# Patient Record
Sex: Female | Born: 1935
Health system: Southern US, Community
[De-identification: ages and names within clinical notes are randomized; demographics above are authoritative.]

## PROBLEM LIST (undated history)

## (undated) DIAGNOSIS — K219 Gastro-esophageal reflux disease without esophagitis: Secondary | ICD-10-CM

## (undated) DIAGNOSIS — K922 Gastrointestinal hemorrhage, unspecified: Secondary | ICD-10-CM

## (undated) DIAGNOSIS — D46C Myelodysplastic syndrome with isolated del(5q) chromosomal abnormality: Secondary | ICD-10-CM

## (undated) DIAGNOSIS — I82409 Acute embolism and thrombosis of unspecified deep veins of unspecified lower extremity: Secondary | ICD-10-CM

## (undated) DIAGNOSIS — I509 Heart failure, unspecified: Secondary | ICD-10-CM

## (undated) DIAGNOSIS — M810 Age-related osteoporosis without current pathological fracture: Secondary | ICD-10-CM

## (undated) DIAGNOSIS — E039 Hypothyroidism, unspecified: Secondary | ICD-10-CM

## (undated) DIAGNOSIS — E785 Hyperlipidemia, unspecified: Secondary | ICD-10-CM

## (undated) DIAGNOSIS — F419 Anxiety disorder, unspecified: Secondary | ICD-10-CM

## (undated) DIAGNOSIS — Z950 Presence of cardiac pacemaker: Secondary | ICD-10-CM

## (undated) DIAGNOSIS — I2699 Other pulmonary embolism without acute cor pulmonale: Secondary | ICD-10-CM

## (undated) DIAGNOSIS — J8410 Pulmonary fibrosis, unspecified: Secondary | ICD-10-CM

## (undated) DIAGNOSIS — J111 Influenza due to unidentified influenza virus with other respiratory manifestations: Secondary | ICD-10-CM

## (undated) DIAGNOSIS — K5792 Diverticulitis of intestine, part unspecified, without perforation or abscess without bleeding: Secondary | ICD-10-CM

## (undated) DIAGNOSIS — I4891 Unspecified atrial fibrillation: Secondary | ICD-10-CM

## (undated) DIAGNOSIS — K279 Peptic ulcer, site unspecified, unspecified as acute or chronic, without hemorrhage or perforation: Secondary | ICD-10-CM

## (undated) DIAGNOSIS — D332 Benign neoplasm of brain, unspecified: Secondary | ICD-10-CM

## (undated) DIAGNOSIS — H9190 Unspecified hearing loss, unspecified ear: Secondary | ICD-10-CM

## (undated) DIAGNOSIS — M1990 Unspecified osteoarthritis, unspecified site: Secondary | ICD-10-CM

## (undated) DIAGNOSIS — I959 Hypotension, unspecified: Secondary | ICD-10-CM

## (undated) HISTORY — DX: Myelodysplastic syndrome with isolated del(5q) chromosomal abnormality: D46.C

## (undated) HISTORY — DX: Hypothyroidism, unspecified: E03.9

## (undated) HISTORY — DX: Hypotension, unspecified: I95.9

## (undated) HISTORY — PX: HERNIA REPAIR: SHX51

## (undated) HISTORY — DX: Unspecified hearing loss, unspecified ear: H91.90

## (undated) HISTORY — PX: BACK SURGERY: SHX140

## (undated) HISTORY — DX: Influenza due to unidentified influenza virus with other respiratory manifestations: J11.1

## (undated) HISTORY — DX: Benign neoplasm of brain, unspecified: D33.2

## (undated) HISTORY — PX: BILATERAL CARPAL TUNNEL RELEASE: SHX6508

## (undated) HISTORY — PX: APPENDECTOMY: SHX54

## (undated) HISTORY — PX: CHOLECYSTECTOMY: SHX55

## (undated) HISTORY — PX: TONSILLECTOMY: SUR1361

## (undated) HISTORY — DX: Gastro-esophageal reflux disease without esophagitis: K21.9

## (undated) HISTORY — DX: Age-related osteoporosis without current pathological fracture: M81.0

## (undated) HISTORY — DX: Diverticulitis of intestine, part unspecified, without perforation or abscess without bleeding: K57.92

## (undated) HISTORY — DX: Unspecified osteoarthritis, unspecified site: M19.90

## (undated) HISTORY — PX: CATARACT EXTRACTION, BILATERAL: SHX1313

## (undated) HISTORY — DX: Anxiety disorder, unspecified: F41.9

## (undated) HISTORY — PX: PACEMAKER INSERTION: SHX728

## (undated) HISTORY — DX: Peptic ulcer, site unspecified, unspecified as acute or chronic, without hemorrhage or perforation: K27.9

## (undated) HISTORY — PX: TUBAL LIGATION: SHX77

## (undated) HISTORY — PX: ABLATION: SHX5711

## (undated) HISTORY — DX: Hyperlipidemia, unspecified: E78.5

---

## 2010-10-09 DIAGNOSIS — M1990 Unspecified osteoarthritis, unspecified site: Secondary | ICD-10-CM

## 2010-10-09 DIAGNOSIS — M810 Age-related osteoporosis without current pathological fracture: Secondary | ICD-10-CM

## 2010-10-09 HISTORY — DX: Unspecified osteoarthritis, unspecified site: M19.90

## 2010-10-09 HISTORY — DX: Age-related osteoporosis without current pathological fracture: M81.0

## 2014-11-04 DIAGNOSIS — J111 Influenza due to unidentified influenza virus with other respiratory manifestations: Secondary | ICD-10-CM

## 2014-11-04 HISTORY — DX: Influenza due to unidentified influenza virus with other respiratory manifestations: J11.1

## 2015-11-07 DIAGNOSIS — M5406 Panniculitis affecting regions of neck and back, lumbar region: Secondary | ICD-10-CM | POA: Diagnosis not present

## 2015-11-07 DIAGNOSIS — M47816 Spondylosis without myelopathy or radiculopathy, lumbar region: Secondary | ICD-10-CM | POA: Diagnosis not present

## 2015-11-07 DIAGNOSIS — M47817 Spondylosis without myelopathy or radiculopathy, lumbosacral region: Secondary | ICD-10-CM | POA: Diagnosis not present

## 2015-11-07 DIAGNOSIS — M5407 Panniculitis affecting regions of neck and back, lumbosacral region: Secondary | ICD-10-CM | POA: Diagnosis not present

## 2015-11-28 DIAGNOSIS — M5406 Panniculitis affecting regions of neck and back, lumbar region: Secondary | ICD-10-CM | POA: Diagnosis not present

## 2015-11-28 DIAGNOSIS — M47816 Spondylosis without myelopathy or radiculopathy, lumbar region: Secondary | ICD-10-CM | POA: Diagnosis not present

## 2015-11-28 DIAGNOSIS — M47817 Spondylosis without myelopathy or radiculopathy, lumbosacral region: Secondary | ICD-10-CM | POA: Diagnosis not present

## 2015-11-28 DIAGNOSIS — M5407 Panniculitis affecting regions of neck and back, lumbosacral region: Secondary | ICD-10-CM | POA: Diagnosis not present

## 2015-12-19 DIAGNOSIS — M5406 Panniculitis affecting regions of neck and back, lumbar region: Secondary | ICD-10-CM | POA: Diagnosis not present

## 2015-12-19 DIAGNOSIS — M47816 Spondylosis without myelopathy or radiculopathy, lumbar region: Secondary | ICD-10-CM | POA: Diagnosis not present

## 2015-12-19 DIAGNOSIS — M47817 Spondylosis without myelopathy or radiculopathy, lumbosacral region: Secondary | ICD-10-CM | POA: Diagnosis not present

## 2015-12-19 DIAGNOSIS — M5407 Panniculitis affecting regions of neck and back, lumbosacral region: Secondary | ICD-10-CM | POA: Diagnosis not present

## 2015-12-27 DIAGNOSIS — M5407 Panniculitis affecting regions of neck and back, lumbosacral region: Secondary | ICD-10-CM | POA: Diagnosis not present

## 2015-12-27 DIAGNOSIS — M25561 Pain in right knee: Secondary | ICD-10-CM | POA: Diagnosis not present

## 2015-12-27 DIAGNOSIS — M545 Low back pain: Secondary | ICD-10-CM | POA: Diagnosis not present

## 2016-01-10 DIAGNOSIS — Z91128 Patient's intentional underdosing of medication regimen for other reason: Secondary | ICD-10-CM | POA: Diagnosis not present

## 2016-01-10 DIAGNOSIS — I6529 Occlusion and stenosis of unspecified carotid artery: Secondary | ICD-10-CM | POA: Diagnosis not present

## 2016-01-10 DIAGNOSIS — H8149 Vertigo of central origin, unspecified ear: Secondary | ICD-10-CM | POA: Diagnosis not present

## 2016-01-10 DIAGNOSIS — D320 Benign neoplasm of cerebral meninges: Secondary | ICD-10-CM | POA: Diagnosis present

## 2016-01-10 DIAGNOSIS — R51 Headache: Secondary | ICD-10-CM | POA: Diagnosis present

## 2016-01-10 DIAGNOSIS — E2740 Unspecified adrenocortical insufficiency: Secondary | ICD-10-CM | POA: Diagnosis not present

## 2016-01-10 DIAGNOSIS — E785 Hyperlipidemia, unspecified: Secondary | ICD-10-CM | POA: Diagnosis not present

## 2016-01-10 DIAGNOSIS — R109 Unspecified abdominal pain: Secondary | ICD-10-CM | POA: Diagnosis not present

## 2016-01-10 DIAGNOSIS — R05 Cough: Secondary | ICD-10-CM | POA: Diagnosis not present

## 2016-01-10 DIAGNOSIS — R0602 Shortness of breath: Secondary | ICD-10-CM | POA: Diagnosis not present

## 2016-01-10 DIAGNOSIS — I951 Orthostatic hypotension: Secondary | ICD-10-CM | POA: Diagnosis present

## 2016-01-10 DIAGNOSIS — R0609 Other forms of dyspnea: Secondary | ICD-10-CM | POA: Diagnosis not present

## 2016-01-10 DIAGNOSIS — I341 Nonrheumatic mitral (valve) prolapse: Secondary | ICD-10-CM | POA: Diagnosis present

## 2016-01-10 DIAGNOSIS — E039 Hypothyroidism, unspecified: Secondary | ICD-10-CM | POA: Diagnosis present

## 2016-01-10 DIAGNOSIS — R531 Weakness: Secondary | ICD-10-CM | POA: Diagnosis present

## 2016-01-10 DIAGNOSIS — R000 Tachycardia, unspecified: Secondary | ICD-10-CM | POA: Diagnosis not present

## 2016-01-10 DIAGNOSIS — D539 Nutritional anemia, unspecified: Secondary | ICD-10-CM | POA: Diagnosis not present

## 2016-01-10 DIAGNOSIS — R8279 Other abnormal findings on microbiological examination of urine: Secondary | ICD-10-CM | POA: Diagnosis present

## 2016-01-10 DIAGNOSIS — I959 Hypotension, unspecified: Secondary | ICD-10-CM | POA: Diagnosis not present

## 2016-01-10 DIAGNOSIS — T380X6A Underdosing of glucocorticoids and synthetic analogues, initial encounter: Secondary | ICD-10-CM | POA: Diagnosis present

## 2016-01-10 DIAGNOSIS — Z23 Encounter for immunization: Secondary | ICD-10-CM | POA: Diagnosis not present

## 2016-01-11 DIAGNOSIS — I6529 Occlusion and stenosis of unspecified carotid artery: Secondary | ICD-10-CM | POA: Diagnosis not present

## 2016-01-11 DIAGNOSIS — D539 Nutritional anemia, unspecified: Secondary | ICD-10-CM | POA: Diagnosis not present

## 2016-01-11 DIAGNOSIS — E2740 Unspecified adrenocortical insufficiency: Secondary | ICD-10-CM | POA: Diagnosis not present

## 2016-01-11 DIAGNOSIS — R51 Headache: Secondary | ICD-10-CM | POA: Diagnosis not present

## 2016-01-11 DIAGNOSIS — I951 Orthostatic hypotension: Secondary | ICD-10-CM | POA: Diagnosis not present

## 2016-01-11 DIAGNOSIS — H8149 Vertigo of central origin, unspecified ear: Secondary | ICD-10-CM | POA: Diagnosis not present

## 2016-01-11 DIAGNOSIS — R531 Weakness: Secondary | ICD-10-CM | POA: Diagnosis not present

## 2016-01-11 DIAGNOSIS — I959 Hypotension, unspecified: Secondary | ICD-10-CM | POA: Diagnosis not present

## 2016-01-11 DIAGNOSIS — D320 Benign neoplasm of cerebral meninges: Secondary | ICD-10-CM | POA: Diagnosis not present

## 2016-01-12 DIAGNOSIS — I350 Nonrheumatic aortic (valve) stenosis: Secondary | ICD-10-CM | POA: Diagnosis not present

## 2016-01-12 DIAGNOSIS — I272 Other secondary pulmonary hypertension: Secondary | ICD-10-CM | POA: Diagnosis not present

## 2016-01-16 DIAGNOSIS — I10 Essential (primary) hypertension: Secondary | ICD-10-CM | POA: Diagnosis not present

## 2016-01-16 DIAGNOSIS — E784 Other hyperlipidemia: Secondary | ICD-10-CM | POA: Diagnosis not present

## 2016-01-16 DIAGNOSIS — Z8249 Family history of ischemic heart disease and other diseases of the circulatory system: Secondary | ICD-10-CM | POA: Diagnosis not present

## 2016-01-16 DIAGNOSIS — K219 Gastro-esophageal reflux disease without esophagitis: Secondary | ICD-10-CM | POA: Diagnosis not present

## 2016-01-16 DIAGNOSIS — R072 Precordial pain: Secondary | ICD-10-CM | POA: Diagnosis not present

## 2016-01-16 DIAGNOSIS — G4700 Insomnia, unspecified: Secondary | ICD-10-CM | POA: Diagnosis not present

## 2016-01-16 DIAGNOSIS — E039 Hypothyroidism, unspecified: Secondary | ICD-10-CM | POA: Diagnosis not present

## 2016-01-16 DIAGNOSIS — R9431 Abnormal electrocardiogram [ECG] [EKG]: Secondary | ICD-10-CM | POA: Diagnosis not present

## 2016-01-17 DIAGNOSIS — D518 Other vitamin B12 deficiency anemias: Secondary | ICD-10-CM | POA: Diagnosis not present

## 2016-01-17 DIAGNOSIS — R0902 Hypoxemia: Secondary | ICD-10-CM | POA: Diagnosis not present

## 2016-01-17 DIAGNOSIS — R0602 Shortness of breath: Secondary | ICD-10-CM | POA: Diagnosis not present

## 2016-01-17 DIAGNOSIS — K219 Gastro-esophageal reflux disease without esophagitis: Secondary | ICD-10-CM | POA: Diagnosis not present

## 2016-01-17 DIAGNOSIS — R918 Other nonspecific abnormal finding of lung field: Secondary | ICD-10-CM | POA: Diagnosis not present

## 2016-01-17 DIAGNOSIS — I951 Orthostatic hypotension: Secondary | ICD-10-CM | POA: Diagnosis not present

## 2016-01-17 DIAGNOSIS — R0989 Other specified symptoms and signs involving the circulatory and respiratory systems: Secondary | ICD-10-CM | POA: Diagnosis not present

## 2016-01-17 DIAGNOSIS — E2740 Unspecified adrenocortical insufficiency: Secondary | ICD-10-CM | POA: Diagnosis not present

## 2016-01-22 DIAGNOSIS — K219 Gastro-esophageal reflux disease without esophagitis: Secondary | ICD-10-CM | POA: Diagnosis not present

## 2016-01-22 DIAGNOSIS — R918 Other nonspecific abnormal finding of lung field: Secondary | ICD-10-CM | POA: Diagnosis not present

## 2016-01-22 DIAGNOSIS — R05 Cough: Secondary | ICD-10-CM | POA: Diagnosis not present

## 2016-01-22 DIAGNOSIS — R0602 Shortness of breath: Secondary | ICD-10-CM | POA: Diagnosis not present

## 2016-01-22 DIAGNOSIS — J9611 Chronic respiratory failure with hypoxia: Secondary | ICD-10-CM | POA: Diagnosis not present

## 2016-01-23 DIAGNOSIS — K219 Gastro-esophageal reflux disease without esophagitis: Secondary | ICD-10-CM | POA: Diagnosis not present

## 2016-01-23 DIAGNOSIS — R1314 Dysphagia, pharyngoesophageal phase: Secondary | ICD-10-CM | POA: Diagnosis not present

## 2016-01-23 DIAGNOSIS — R0902 Hypoxemia: Secondary | ICD-10-CM | POA: Diagnosis not present

## 2016-01-24 DIAGNOSIS — J45909 Unspecified asthma, uncomplicated: Secondary | ICD-10-CM | POA: Diagnosis not present

## 2016-01-24 DIAGNOSIS — R0902 Hypoxemia: Secondary | ICD-10-CM | POA: Diagnosis not present

## 2016-01-24 DIAGNOSIS — K219 Gastro-esophageal reflux disease without esophagitis: Secondary | ICD-10-CM | POA: Diagnosis not present

## 2016-01-24 DIAGNOSIS — I10 Essential (primary) hypertension: Secondary | ICD-10-CM | POA: Diagnosis not present

## 2016-01-24 DIAGNOSIS — E2740 Unspecified adrenocortical insufficiency: Secondary | ICD-10-CM | POA: Diagnosis not present

## 2016-01-24 DIAGNOSIS — I059 Rheumatic mitral valve disease, unspecified: Secondary | ICD-10-CM | POA: Diagnosis not present

## 2016-01-24 DIAGNOSIS — I25118 Atherosclerotic heart disease of native coronary artery with other forms of angina pectoris: Secondary | ICD-10-CM | POA: Diagnosis not present

## 2016-01-26 DIAGNOSIS — M79602 Pain in left arm: Secondary | ICD-10-CM | POA: Diagnosis not present

## 2016-01-26 DIAGNOSIS — I5189 Other ill-defined heart diseases: Secondary | ICD-10-CM | POA: Diagnosis not present

## 2016-01-26 DIAGNOSIS — I959 Hypotension, unspecified: Secondary | ICD-10-CM | POA: Diagnosis not present

## 2016-01-26 DIAGNOSIS — R079 Chest pain, unspecified: Secondary | ICD-10-CM | POA: Diagnosis not present

## 2016-02-08 DIAGNOSIS — B3781 Candidal esophagitis: Secondary | ICD-10-CM | POA: Diagnosis not present

## 2016-02-08 DIAGNOSIS — J45909 Unspecified asthma, uncomplicated: Secondary | ICD-10-CM | POA: Diagnosis not present

## 2016-02-08 DIAGNOSIS — K449 Diaphragmatic hernia without obstruction or gangrene: Secondary | ICD-10-CM | POA: Diagnosis not present

## 2016-02-08 DIAGNOSIS — K219 Gastro-esophageal reflux disease without esophagitis: Secondary | ICD-10-CM | POA: Diagnosis not present

## 2016-02-08 DIAGNOSIS — R0902 Hypoxemia: Secondary | ICD-10-CM | POA: Diagnosis not present

## 2016-02-08 DIAGNOSIS — R1310 Dysphagia, unspecified: Secondary | ICD-10-CM | POA: Diagnosis not present

## 2016-02-08 DIAGNOSIS — K2950 Unspecified chronic gastritis without bleeding: Secondary | ICD-10-CM | POA: Diagnosis not present

## 2016-02-08 DIAGNOSIS — K222 Esophageal obstruction: Secondary | ICD-10-CM | POA: Diagnosis not present

## 2016-02-09 DIAGNOSIS — M810 Age-related osteoporosis without current pathological fracture: Secondary | ICD-10-CM | POA: Diagnosis not present

## 2016-02-21 DIAGNOSIS — J45909 Unspecified asthma, uncomplicated: Secondary | ICD-10-CM | POA: Diagnosis not present

## 2016-02-21 DIAGNOSIS — R7309 Other abnormal glucose: Secondary | ICD-10-CM | POA: Diagnosis not present

## 2016-02-21 DIAGNOSIS — R7989 Other specified abnormal findings of blood chemistry: Secondary | ICD-10-CM | POA: Diagnosis not present

## 2016-02-21 DIAGNOSIS — R0902 Hypoxemia: Secondary | ICD-10-CM | POA: Diagnosis not present

## 2016-02-21 DIAGNOSIS — I059 Rheumatic mitral valve disease, unspecified: Secondary | ICD-10-CM | POA: Diagnosis not present

## 2016-02-21 DIAGNOSIS — I25118 Atherosclerotic heart disease of native coronary artery with other forms of angina pectoris: Secondary | ICD-10-CM | POA: Diagnosis not present

## 2016-02-21 DIAGNOSIS — K219 Gastro-esophageal reflux disease without esophagitis: Secondary | ICD-10-CM | POA: Diagnosis not present

## 2016-02-21 DIAGNOSIS — E2740 Unspecified adrenocortical insufficiency: Secondary | ICD-10-CM | POA: Diagnosis not present

## 2016-02-21 DIAGNOSIS — I10 Essential (primary) hypertension: Secondary | ICD-10-CM | POA: Diagnosis not present

## 2016-02-21 DIAGNOSIS — E538 Deficiency of other specified B group vitamins: Secondary | ICD-10-CM | POA: Diagnosis not present

## 2016-02-21 DIAGNOSIS — R252 Cramp and spasm: Secondary | ICD-10-CM | POA: Diagnosis not present

## 2016-02-23 LAB — BASIC METABOLIC PANEL
BUN: 39 — AB (ref 4–21)
Creatinine: 0.9 (ref 0.5–1.1)
Glucose: 105
Potassium: 5.2 (ref 3.4–5.3)
Sodium: 139 (ref 137–147)

## 2016-02-23 LAB — HEPATIC FUNCTION PANEL
ALT: 51 — AB (ref 7–35)
AST: 17 (ref 13–35)
Alkaline Phosphatase: 45 (ref 25–125)
Bilirubin, Total: 0.4

## 2016-03-12 DIAGNOSIS — J9611 Chronic respiratory failure with hypoxia: Secondary | ICD-10-CM | POA: Diagnosis not present

## 2016-03-12 DIAGNOSIS — R918 Other nonspecific abnormal finding of lung field: Secondary | ICD-10-CM | POA: Diagnosis not present

## 2016-03-12 DIAGNOSIS — R0602 Shortness of breath: Secondary | ICD-10-CM | POA: Diagnosis not present

## 2016-03-19 DIAGNOSIS — J8410 Pulmonary fibrosis, unspecified: Secondary | ICD-10-CM | POA: Diagnosis not present

## 2016-03-19 DIAGNOSIS — K760 Fatty (change of) liver, not elsewhere classified: Secondary | ICD-10-CM | POA: Diagnosis not present

## 2016-03-19 DIAGNOSIS — I2510 Atherosclerotic heart disease of native coronary artery without angina pectoris: Secondary | ICD-10-CM | POA: Diagnosis not present

## 2016-03-19 DIAGNOSIS — R0600 Dyspnea, unspecified: Secondary | ICD-10-CM | POA: Diagnosis not present

## 2016-03-19 DIAGNOSIS — S22009A Unspecified fracture of unspecified thoracic vertebra, initial encounter for closed fracture: Secondary | ICD-10-CM | POA: Diagnosis not present

## 2016-03-21 DIAGNOSIS — J9611 Chronic respiratory failure with hypoxia: Secondary | ICD-10-CM | POA: Diagnosis not present

## 2016-03-21 LAB — PULMONARY FUNCTION TEST
FEV1/FVC: 91 %
FEV1: 1.57 L
FVC: 1.72 L

## 2016-03-24 DIAGNOSIS — T8089XA Other complications following infusion, transfusion and therapeutic injection, initial encounter: Secondary | ICD-10-CM | POA: Diagnosis not present

## 2016-03-24 DIAGNOSIS — I480 Paroxysmal atrial fibrillation: Secondary | ICD-10-CM | POA: Diagnosis not present

## 2016-03-24 DIAGNOSIS — Z86011 Personal history of benign neoplasm of the brain: Secondary | ICD-10-CM | POA: Diagnosis not present

## 2016-03-24 DIAGNOSIS — R0602 Shortness of breath: Secondary | ICD-10-CM | POA: Diagnosis not present

## 2016-03-24 DIAGNOSIS — R471 Dysarthria and anarthria: Secondary | ICD-10-CM | POA: Diagnosis not present

## 2016-03-24 DIAGNOSIS — E785 Hyperlipidemia, unspecified: Secondary | ICD-10-CM | POA: Diagnosis present

## 2016-03-24 DIAGNOSIS — D649 Anemia, unspecified: Secondary | ICD-10-CM | POA: Diagnosis present

## 2016-03-24 DIAGNOSIS — I82432 Acute embolism and thrombosis of left popliteal vein: Secondary | ICD-10-CM | POA: Diagnosis present

## 2016-03-24 DIAGNOSIS — I82412 Acute embolism and thrombosis of left femoral vein: Secondary | ICD-10-CM | POA: Diagnosis present

## 2016-03-24 DIAGNOSIS — E039 Hypothyroidism, unspecified: Secondary | ICD-10-CM | POA: Diagnosis present

## 2016-03-24 DIAGNOSIS — K279 Peptic ulcer, site unspecified, unspecified as acute or chronic, without hemorrhage or perforation: Secondary | ICD-10-CM | POA: Diagnosis present

## 2016-03-24 DIAGNOSIS — I10 Essential (primary) hypertension: Secondary | ICD-10-CM | POA: Diagnosis present

## 2016-03-24 DIAGNOSIS — K922 Gastrointestinal hemorrhage, unspecified: Secondary | ICD-10-CM | POA: Diagnosis present

## 2016-03-24 DIAGNOSIS — H9190 Unspecified hearing loss, unspecified ear: Secondary | ICD-10-CM | POA: Diagnosis present

## 2016-03-24 DIAGNOSIS — I4891 Unspecified atrial fibrillation: Secondary | ICD-10-CM | POA: Diagnosis not present

## 2016-03-24 DIAGNOSIS — I639 Cerebral infarction, unspecified: Secondary | ICD-10-CM | POA: Diagnosis not present

## 2016-03-24 DIAGNOSIS — E538 Deficiency of other specified B group vitamins: Secondary | ICD-10-CM | POA: Diagnosis present

## 2016-03-24 DIAGNOSIS — K219 Gastro-esophageal reflux disease without esophagitis: Secondary | ICD-10-CM | POA: Diagnosis present

## 2016-03-24 DIAGNOSIS — E7800 Pure hypercholesterolemia, unspecified: Secondary | ICD-10-CM | POA: Diagnosis present

## 2016-03-24 DIAGNOSIS — J84112 Idiopathic pulmonary fibrosis: Secondary | ICD-10-CM | POA: Diagnosis present

## 2016-03-24 DIAGNOSIS — E119 Type 2 diabetes mellitus without complications: Secondary | ICD-10-CM | POA: Diagnosis not present

## 2016-03-24 DIAGNOSIS — R42 Dizziness and giddiness: Secondary | ICD-10-CM | POA: Diagnosis present

## 2016-03-24 DIAGNOSIS — D709 Neutropenia, unspecified: Secondary | ICD-10-CM | POA: Diagnosis not present

## 2016-03-24 DIAGNOSIS — D539 Nutritional anemia, unspecified: Secondary | ICD-10-CM | POA: Diagnosis not present

## 2016-03-24 DIAGNOSIS — I2699 Other pulmonary embolism without acute cor pulmonale: Secondary | ICD-10-CM | POA: Diagnosis not present

## 2016-03-24 DIAGNOSIS — E876 Hypokalemia: Secondary | ICD-10-CM | POA: Diagnosis not present

## 2016-03-24 DIAGNOSIS — Z7982 Long term (current) use of aspirin: Secondary | ICD-10-CM | POA: Diagnosis not present

## 2016-03-24 DIAGNOSIS — M1990 Unspecified osteoarthritis, unspecified site: Secondary | ICD-10-CM | POA: Diagnosis present

## 2016-03-24 DIAGNOSIS — I2510 Atherosclerotic heart disease of native coronary artery without angina pectoris: Secondary | ICD-10-CM | POA: Diagnosis present

## 2016-03-24 DIAGNOSIS — D696 Thrombocytopenia, unspecified: Secondary | ICD-10-CM | POA: Diagnosis not present

## 2016-04-04 DIAGNOSIS — I82402 Acute embolism and thrombosis of unspecified deep veins of left lower extremity: Secondary | ICD-10-CM | POA: Diagnosis not present

## 2016-04-04 DIAGNOSIS — I110 Hypertensive heart disease with heart failure: Secondary | ICD-10-CM | POA: Diagnosis not present

## 2016-04-04 DIAGNOSIS — R001 Bradycardia, unspecified: Secondary | ICD-10-CM | POA: Diagnosis not present

## 2016-04-04 DIAGNOSIS — T44991A Poisoning by other drug primarily affecting the autonomic nervous system, accidental (unintentional), initial encounter: Secondary | ICD-10-CM | POA: Diagnosis not present

## 2016-04-04 DIAGNOSIS — I495 Sick sinus syndrome: Secondary | ICD-10-CM | POA: Diagnosis not present

## 2016-04-04 DIAGNOSIS — R42 Dizziness and giddiness: Secondary | ICD-10-CM | POA: Diagnosis not present

## 2016-04-04 DIAGNOSIS — I481 Persistent atrial fibrillation: Secondary | ICD-10-CM | POA: Diagnosis not present

## 2016-04-04 DIAGNOSIS — I951 Orthostatic hypotension: Secondary | ICD-10-CM | POA: Diagnosis not present

## 2016-04-04 DIAGNOSIS — T447X5A Adverse effect of beta-adrenoreceptor antagonists, initial encounter: Secondary | ICD-10-CM | POA: Diagnosis not present

## 2016-04-04 DIAGNOSIS — I5033 Acute on chronic diastolic (congestive) heart failure: Secondary | ICD-10-CM | POA: Diagnosis not present

## 2016-04-04 DIAGNOSIS — J9611 Chronic respiratory failure with hypoxia: Secondary | ICD-10-CM | POA: Diagnosis not present

## 2016-04-05 DIAGNOSIS — I4891 Unspecified atrial fibrillation: Secondary | ICD-10-CM | POA: Diagnosis not present

## 2016-04-05 DIAGNOSIS — E039 Hypothyroidism, unspecified: Secondary | ICD-10-CM | POA: Diagnosis present

## 2016-04-05 DIAGNOSIS — I341 Nonrheumatic mitral (valve) prolapse: Secondary | ICD-10-CM | POA: Diagnosis present

## 2016-04-05 DIAGNOSIS — I509 Heart failure, unspecified: Secondary | ICD-10-CM | POA: Diagnosis not present

## 2016-04-05 DIAGNOSIS — I10 Essential (primary) hypertension: Secondary | ICD-10-CM | POA: Diagnosis not present

## 2016-04-05 DIAGNOSIS — I82402 Acute embolism and thrombosis of unspecified deep veins of left lower extremity: Secondary | ICD-10-CM | POA: Diagnosis present

## 2016-04-05 DIAGNOSIS — J84112 Idiopathic pulmonary fibrosis: Secondary | ICD-10-CM | POA: Diagnosis present

## 2016-04-05 DIAGNOSIS — T44991A Poisoning by other drug primarily affecting the autonomic nervous system, accidental (unintentional), initial encounter: Secondary | ICD-10-CM | POA: Diagnosis not present

## 2016-04-05 DIAGNOSIS — I2510 Atherosclerotic heart disease of native coronary artery without angina pectoris: Secondary | ICD-10-CM | POA: Diagnosis present

## 2016-04-05 DIAGNOSIS — I480 Paroxysmal atrial fibrillation: Secondary | ICD-10-CM | POA: Diagnosis not present

## 2016-04-05 DIAGNOSIS — I959 Hypotension, unspecified: Secondary | ICD-10-CM | POA: Diagnosis not present

## 2016-04-05 DIAGNOSIS — I952 Hypotension due to drugs: Secondary | ICD-10-CM | POA: Diagnosis present

## 2016-04-05 DIAGNOSIS — R42 Dizziness and giddiness: Secondary | ICD-10-CM | POA: Diagnosis not present

## 2016-04-05 DIAGNOSIS — R001 Bradycardia, unspecified: Secondary | ICD-10-CM | POA: Diagnosis not present

## 2016-04-05 DIAGNOSIS — D649 Anemia, unspecified: Secondary | ICD-10-CM | POA: Diagnosis present

## 2016-04-05 DIAGNOSIS — K219 Gastro-esophageal reflux disease without esophagitis: Secondary | ICD-10-CM | POA: Diagnosis present

## 2016-04-05 DIAGNOSIS — I495 Sick sinus syndrome: Secondary | ICD-10-CM | POA: Diagnosis present

## 2016-04-05 DIAGNOSIS — J9611 Chronic respiratory failure with hypoxia: Secondary | ICD-10-CM | POA: Diagnosis present

## 2016-04-05 DIAGNOSIS — R031 Nonspecific low blood-pressure reading: Secondary | ICD-10-CM | POA: Diagnosis not present

## 2016-04-05 DIAGNOSIS — Z7901 Long term (current) use of anticoagulants: Secondary | ICD-10-CM | POA: Diagnosis not present

## 2016-04-05 DIAGNOSIS — Z86011 Personal history of benign neoplasm of the brain: Secondary | ICD-10-CM | POA: Diagnosis not present

## 2016-04-05 DIAGNOSIS — I951 Orthostatic hypotension: Secondary | ICD-10-CM | POA: Diagnosis not present

## 2016-04-05 DIAGNOSIS — T447X5A Adverse effect of beta-adrenoreceptor antagonists, initial encounter: Secondary | ICD-10-CM | POA: Diagnosis not present

## 2016-04-05 DIAGNOSIS — I110 Hypertensive heart disease with heart failure: Secondary | ICD-10-CM | POA: Diagnosis present

## 2016-04-05 DIAGNOSIS — I5033 Acute on chronic diastolic (congestive) heart failure: Secondary | ICD-10-CM | POA: Diagnosis present

## 2016-04-05 DIAGNOSIS — I481 Persistent atrial fibrillation: Secondary | ICD-10-CM | POA: Diagnosis present

## 2016-04-05 DIAGNOSIS — D6489 Other specified anemias: Secondary | ICD-10-CM | POA: Diagnosis present

## 2016-04-05 DIAGNOSIS — C148 Malignant neoplasm of overlapping sites of lip, oral cavity and pharynx: Secondary | ICD-10-CM | POA: Diagnosis not present

## 2016-04-06 DIAGNOSIS — E039 Hypothyroidism, unspecified: Secondary | ICD-10-CM | POA: Diagnosis present

## 2016-04-06 DIAGNOSIS — Z9981 Dependence on supplemental oxygen: Secondary | ICD-10-CM | POA: Diagnosis not present

## 2016-04-06 DIAGNOSIS — J45909 Unspecified asthma, uncomplicated: Secondary | ICD-10-CM | POA: Diagnosis present

## 2016-04-06 DIAGNOSIS — J849 Interstitial pulmonary disease, unspecified: Secondary | ICD-10-CM | POA: Diagnosis present

## 2016-04-06 DIAGNOSIS — Z7901 Long term (current) use of anticoagulants: Secondary | ICD-10-CM | POA: Diagnosis not present

## 2016-04-06 DIAGNOSIS — R001 Bradycardia, unspecified: Secondary | ICD-10-CM | POA: Diagnosis not present

## 2016-04-06 DIAGNOSIS — I4891 Unspecified atrial fibrillation: Secondary | ICD-10-CM | POA: Diagnosis not present

## 2016-04-06 DIAGNOSIS — J90 Pleural effusion, not elsewhere classified: Secondary | ICD-10-CM | POA: Diagnosis present

## 2016-04-06 DIAGNOSIS — E785 Hyperlipidemia, unspecified: Secondary | ICD-10-CM | POA: Diagnosis present

## 2016-04-06 DIAGNOSIS — Z8673 Personal history of transient ischemic attack (TIA), and cerebral infarction without residual deficits: Secondary | ICD-10-CM | POA: Diagnosis not present

## 2016-04-06 DIAGNOSIS — D539 Nutritional anemia, unspecified: Secondary | ICD-10-CM | POA: Diagnosis present

## 2016-04-06 DIAGNOSIS — Z8249 Family history of ischemic heart disease and other diseases of the circulatory system: Secondary | ICD-10-CM | POA: Diagnosis not present

## 2016-04-06 DIAGNOSIS — I341 Nonrheumatic mitral (valve) prolapse: Secondary | ICD-10-CM | POA: Diagnosis present

## 2016-04-06 DIAGNOSIS — I340 Nonrheumatic mitral (valve) insufficiency: Secondary | ICD-10-CM | POA: Diagnosis not present

## 2016-04-06 DIAGNOSIS — K219 Gastro-esophageal reflux disease without esophagitis: Secondary | ICD-10-CM | POA: Diagnosis present

## 2016-04-06 DIAGNOSIS — R0600 Dyspnea, unspecified: Secondary | ICD-10-CM | POA: Diagnosis not present

## 2016-04-06 DIAGNOSIS — I82402 Acute embolism and thrombosis of unspecified deep veins of left lower extremity: Secondary | ICD-10-CM | POA: Diagnosis present

## 2016-04-06 DIAGNOSIS — M1990 Unspecified osteoarthritis, unspecified site: Secondary | ICD-10-CM | POA: Diagnosis present

## 2016-04-06 DIAGNOSIS — I480 Paroxysmal atrial fibrillation: Secondary | ICD-10-CM | POA: Diagnosis present

## 2016-04-06 DIAGNOSIS — J810 Acute pulmonary edema: Secondary | ICD-10-CM | POA: Diagnosis not present

## 2016-04-16 DIAGNOSIS — I481 Persistent atrial fibrillation: Secondary | ICD-10-CM | POA: Diagnosis not present

## 2016-04-16 DIAGNOSIS — I482 Chronic atrial fibrillation: Secondary | ICD-10-CM | POA: Diagnosis not present

## 2016-04-16 DIAGNOSIS — Z888 Allergy status to other drugs, medicaments and biological substances status: Secondary | ICD-10-CM | POA: Diagnosis not present

## 2016-04-16 DIAGNOSIS — J45909 Unspecified asthma, uncomplicated: Secondary | ICD-10-CM | POA: Diagnosis present

## 2016-04-16 DIAGNOSIS — I82402 Acute embolism and thrombosis of unspecified deep veins of left lower extremity: Secondary | ICD-10-CM | POA: Diagnosis not present

## 2016-04-16 DIAGNOSIS — Z4682 Encounter for fitting and adjustment of non-vascular catheter: Secondary | ICD-10-CM | POA: Diagnosis not present

## 2016-04-16 DIAGNOSIS — Z79899 Other long term (current) drug therapy: Secondary | ICD-10-CM | POA: Diagnosis not present

## 2016-04-16 DIAGNOSIS — I5032 Chronic diastolic (congestive) heart failure: Secondary | ICD-10-CM | POA: Diagnosis present

## 2016-04-16 DIAGNOSIS — K279 Peptic ulcer, site unspecified, unspecified as acute or chronic, without hemorrhage or perforation: Secondary | ICD-10-CM | POA: Diagnosis present

## 2016-04-16 DIAGNOSIS — I808 Phlebitis and thrombophlebitis of other sites: Secondary | ICD-10-CM | POA: Diagnosis not present

## 2016-04-16 DIAGNOSIS — I82409 Acute embolism and thrombosis of unspecified deep veins of unspecified lower extremity: Secondary | ICD-10-CM | POA: Diagnosis not present

## 2016-04-16 DIAGNOSIS — R0602 Shortness of breath: Secondary | ICD-10-CM | POA: Diagnosis not present

## 2016-04-16 DIAGNOSIS — K259 Gastric ulcer, unspecified as acute or chronic, without hemorrhage or perforation: Secondary | ICD-10-CM | POA: Diagnosis not present

## 2016-04-16 DIAGNOSIS — Z7901 Long term (current) use of anticoagulants: Secondary | ICD-10-CM | POA: Diagnosis not present

## 2016-04-16 DIAGNOSIS — Z9981 Dependence on supplemental oxygen: Secondary | ICD-10-CM | POA: Diagnosis not present

## 2016-04-16 DIAGNOSIS — R1310 Dysphagia, unspecified: Secondary | ICD-10-CM | POA: Diagnosis present

## 2016-04-16 DIAGNOSIS — R109 Unspecified abdominal pain: Secondary | ICD-10-CM | POA: Diagnosis not present

## 2016-04-16 DIAGNOSIS — E039 Hypothyroidism, unspecified: Secondary | ICD-10-CM | POA: Diagnosis present

## 2016-04-16 DIAGNOSIS — K228 Other specified diseases of esophagus: Secondary | ICD-10-CM | POA: Diagnosis present

## 2016-04-16 DIAGNOSIS — I4891 Unspecified atrial fibrillation: Secondary | ICD-10-CM | POA: Diagnosis present

## 2016-04-16 DIAGNOSIS — J810 Acute pulmonary edema: Secondary | ICD-10-CM | POA: Diagnosis not present

## 2016-04-16 DIAGNOSIS — I341 Nonrheumatic mitral (valve) prolapse: Secondary | ICD-10-CM | POA: Diagnosis present

## 2016-04-16 DIAGNOSIS — R918 Other nonspecific abnormal finding of lung field: Secondary | ICD-10-CM | POA: Diagnosis not present

## 2016-04-16 DIAGNOSIS — H9190 Unspecified hearing loss, unspecified ear: Secondary | ICD-10-CM | POA: Diagnosis present

## 2016-04-16 DIAGNOSIS — D539 Nutritional anemia, unspecified: Secondary | ICD-10-CM | POA: Diagnosis present

## 2016-04-16 DIAGNOSIS — I509 Heart failure, unspecified: Secondary | ICD-10-CM | POA: Diagnosis not present

## 2016-04-16 DIAGNOSIS — K219 Gastro-esophageal reflux disease without esophagitis: Secondary | ICD-10-CM | POA: Diagnosis present

## 2016-04-16 DIAGNOSIS — Z7982 Long term (current) use of aspirin: Secondary | ICD-10-CM | POA: Diagnosis not present

## 2016-04-16 DIAGNOSIS — R079 Chest pain, unspecified: Secondary | ICD-10-CM | POA: Diagnosis not present

## 2016-04-16 DIAGNOSIS — Z79890 Hormone replacement therapy (postmenopausal): Secondary | ICD-10-CM | POA: Diagnosis not present

## 2016-04-16 DIAGNOSIS — J849 Interstitial pulmonary disease, unspecified: Secondary | ICD-10-CM | POA: Diagnosis present

## 2016-04-16 DIAGNOSIS — E785 Hyperlipidemia, unspecified: Secondary | ICD-10-CM | POA: Diagnosis present

## 2016-04-16 DIAGNOSIS — J90 Pleural effusion, not elsewhere classified: Secondary | ICD-10-CM | POA: Diagnosis not present

## 2016-04-23 DIAGNOSIS — J849 Interstitial pulmonary disease, unspecified: Secondary | ICD-10-CM | POA: Diagnosis not present

## 2016-04-23 DIAGNOSIS — J45909 Unspecified asthma, uncomplicated: Secondary | ICD-10-CM | POA: Diagnosis not present

## 2016-04-23 DIAGNOSIS — M1991 Primary osteoarthritis, unspecified site: Secondary | ICD-10-CM | POA: Diagnosis not present

## 2016-04-23 DIAGNOSIS — Z48812 Encounter for surgical aftercare following surgery on the circulatory system: Secondary | ICD-10-CM | POA: Diagnosis not present

## 2016-04-23 DIAGNOSIS — I4891 Unspecified atrial fibrillation: Secondary | ICD-10-CM | POA: Diagnosis not present

## 2016-04-23 DIAGNOSIS — D539 Nutritional anemia, unspecified: Secondary | ICD-10-CM | POA: Diagnosis not present

## 2016-04-24 DIAGNOSIS — I480 Paroxysmal atrial fibrillation: Secondary | ICD-10-CM | POA: Diagnosis not present

## 2016-04-24 DIAGNOSIS — M1991 Primary osteoarthritis, unspecified site: Secondary | ICD-10-CM | POA: Diagnosis not present

## 2016-04-24 DIAGNOSIS — I4891 Unspecified atrial fibrillation: Secondary | ICD-10-CM | POA: Diagnosis not present

## 2016-04-24 DIAGNOSIS — J45909 Unspecified asthma, uncomplicated: Secondary | ICD-10-CM | POA: Diagnosis not present

## 2016-04-24 DIAGNOSIS — Z7901 Long term (current) use of anticoagulants: Secondary | ICD-10-CM | POA: Diagnosis not present

## 2016-04-24 DIAGNOSIS — J849 Interstitial pulmonary disease, unspecified: Secondary | ICD-10-CM | POA: Diagnosis not present

## 2016-04-24 DIAGNOSIS — D539 Nutritional anemia, unspecified: Secondary | ICD-10-CM | POA: Diagnosis not present

## 2016-04-24 DIAGNOSIS — Z48812 Encounter for surgical aftercare following surgery on the circulatory system: Secondary | ICD-10-CM | POA: Diagnosis not present

## 2016-04-24 DIAGNOSIS — R630 Anorexia: Secondary | ICD-10-CM | POA: Diagnosis not present

## 2016-04-25 DIAGNOSIS — D649 Anemia, unspecified: Secondary | ICD-10-CM | POA: Diagnosis not present

## 2016-04-29 DIAGNOSIS — D539 Nutritional anemia, unspecified: Secondary | ICD-10-CM | POA: Diagnosis not present

## 2016-04-29 DIAGNOSIS — J45909 Unspecified asthma, uncomplicated: Secondary | ICD-10-CM | POA: Diagnosis not present

## 2016-04-29 DIAGNOSIS — Z48812 Encounter for surgical aftercare following surgery on the circulatory system: Secondary | ICD-10-CM | POA: Diagnosis not present

## 2016-04-29 DIAGNOSIS — I4891 Unspecified atrial fibrillation: Secondary | ICD-10-CM | POA: Diagnosis not present

## 2016-04-29 DIAGNOSIS — J849 Interstitial pulmonary disease, unspecified: Secondary | ICD-10-CM | POA: Diagnosis not present

## 2016-04-29 DIAGNOSIS — M1991 Primary osteoarthritis, unspecified site: Secondary | ICD-10-CM | POA: Diagnosis not present

## 2016-05-01 DIAGNOSIS — M1991 Primary osteoarthritis, unspecified site: Secondary | ICD-10-CM | POA: Diagnosis not present

## 2016-05-01 DIAGNOSIS — D539 Nutritional anemia, unspecified: Secondary | ICD-10-CM | POA: Diagnosis not present

## 2016-05-01 DIAGNOSIS — J45909 Unspecified asthma, uncomplicated: Secondary | ICD-10-CM | POA: Diagnosis not present

## 2016-05-01 DIAGNOSIS — Z48812 Encounter for surgical aftercare following surgery on the circulatory system: Secondary | ICD-10-CM | POA: Diagnosis not present

## 2016-05-01 DIAGNOSIS — I4891 Unspecified atrial fibrillation: Secondary | ICD-10-CM | POA: Diagnosis not present

## 2016-05-01 DIAGNOSIS — J849 Interstitial pulmonary disease, unspecified: Secondary | ICD-10-CM | POA: Diagnosis not present

## 2016-05-02 DIAGNOSIS — I4891 Unspecified atrial fibrillation: Secondary | ICD-10-CM | POA: Diagnosis not present

## 2016-05-02 DIAGNOSIS — Z950 Presence of cardiac pacemaker: Secondary | ICD-10-CM | POA: Diagnosis not present

## 2016-05-06 DIAGNOSIS — M1991 Primary osteoarthritis, unspecified site: Secondary | ICD-10-CM | POA: Diagnosis not present

## 2016-05-06 DIAGNOSIS — I4891 Unspecified atrial fibrillation: Secondary | ICD-10-CM | POA: Diagnosis not present

## 2016-05-06 DIAGNOSIS — J849 Interstitial pulmonary disease, unspecified: Secondary | ICD-10-CM | POA: Diagnosis not present

## 2016-05-06 DIAGNOSIS — Z48812 Encounter for surgical aftercare following surgery on the circulatory system: Secondary | ICD-10-CM | POA: Diagnosis not present

## 2016-05-08 DIAGNOSIS — D539 Nutritional anemia, unspecified: Secondary | ICD-10-CM | POA: Diagnosis not present

## 2016-05-08 DIAGNOSIS — M1991 Primary osteoarthritis, unspecified site: Secondary | ICD-10-CM | POA: Diagnosis not present

## 2016-05-08 DIAGNOSIS — J45909 Unspecified asthma, uncomplicated: Secondary | ICD-10-CM | POA: Diagnosis not present

## 2016-05-08 DIAGNOSIS — Z48812 Encounter for surgical aftercare following surgery on the circulatory system: Secondary | ICD-10-CM | POA: Diagnosis not present

## 2016-05-08 DIAGNOSIS — J849 Interstitial pulmonary disease, unspecified: Secondary | ICD-10-CM | POA: Diagnosis not present

## 2016-05-08 DIAGNOSIS — I4891 Unspecified atrial fibrillation: Secondary | ICD-10-CM | POA: Diagnosis not present

## 2016-05-09 DIAGNOSIS — Z5181 Encounter for therapeutic drug level monitoring: Secondary | ICD-10-CM | POA: Diagnosis not present

## 2016-05-09 DIAGNOSIS — J84112 Idiopathic pulmonary fibrosis: Secondary | ICD-10-CM | POA: Diagnosis not present

## 2016-05-09 DIAGNOSIS — Z48812 Encounter for surgical aftercare following surgery on the circulatory system: Secondary | ICD-10-CM | POA: Diagnosis not present

## 2016-05-09 DIAGNOSIS — I4891 Unspecified atrial fibrillation: Secondary | ICD-10-CM | POA: Diagnosis not present

## 2016-05-09 DIAGNOSIS — J45909 Unspecified asthma, uncomplicated: Secondary | ICD-10-CM | POA: Diagnosis not present

## 2016-05-09 DIAGNOSIS — D519 Vitamin B12 deficiency anemia, unspecified: Secondary | ICD-10-CM | POA: Diagnosis not present

## 2016-05-09 DIAGNOSIS — J849 Interstitial pulmonary disease, unspecified: Secondary | ICD-10-CM | POA: Diagnosis not present

## 2016-05-09 DIAGNOSIS — M1991 Primary osteoarthritis, unspecified site: Secondary | ICD-10-CM | POA: Diagnosis not present

## 2016-05-09 DIAGNOSIS — D539 Nutritional anemia, unspecified: Secondary | ICD-10-CM | POA: Diagnosis not present

## 2016-05-09 LAB — BASIC METABOLIC PANEL
BUN: 9 (ref 4–21)
CREATININE: 0.8 (ref <=1.1)
GLUCOSE: 88
Potassium: 3.8 (ref 3.4–5.3)
Sodium: 144 (ref 137–147)

## 2016-05-09 LAB — HEPATIC FUNCTION PANEL
ALT: 21 (ref 7–35)
AST: 19 (ref 13–35)
Alkaline Phosphatase: 49 (ref 25–125)
BILIRUBIN, TOTAL: 0.6

## 2016-05-09 LAB — CBC AND DIFFERENTIAL
PLATELETS: 168 (ref 150–399)
WBC: 3.5

## 2016-05-10 DIAGNOSIS — J45909 Unspecified asthma, uncomplicated: Secondary | ICD-10-CM | POA: Diagnosis not present

## 2016-05-10 DIAGNOSIS — D539 Nutritional anemia, unspecified: Secondary | ICD-10-CM | POA: Diagnosis not present

## 2016-05-10 DIAGNOSIS — Z48812 Encounter for surgical aftercare following surgery on the circulatory system: Secondary | ICD-10-CM | POA: Diagnosis not present

## 2016-05-10 DIAGNOSIS — M1991 Primary osteoarthritis, unspecified site: Secondary | ICD-10-CM | POA: Diagnosis not present

## 2016-05-10 DIAGNOSIS — J849 Interstitial pulmonary disease, unspecified: Secondary | ICD-10-CM | POA: Diagnosis not present

## 2016-05-10 DIAGNOSIS — I4891 Unspecified atrial fibrillation: Secondary | ICD-10-CM | POA: Diagnosis not present

## 2016-05-13 DIAGNOSIS — M1991 Primary osteoarthritis, unspecified site: Secondary | ICD-10-CM | POA: Diagnosis not present

## 2016-05-13 DIAGNOSIS — D539 Nutritional anemia, unspecified: Secondary | ICD-10-CM | POA: Diagnosis not present

## 2016-05-13 DIAGNOSIS — Z48812 Encounter for surgical aftercare following surgery on the circulatory system: Secondary | ICD-10-CM | POA: Diagnosis not present

## 2016-05-13 DIAGNOSIS — J45909 Unspecified asthma, uncomplicated: Secondary | ICD-10-CM | POA: Diagnosis not present

## 2016-05-13 DIAGNOSIS — I4891 Unspecified atrial fibrillation: Secondary | ICD-10-CM | POA: Diagnosis not present

## 2016-05-13 DIAGNOSIS — J849 Interstitial pulmonary disease, unspecified: Secondary | ICD-10-CM | POA: Diagnosis not present

## 2016-05-14 DIAGNOSIS — J84112 Idiopathic pulmonary fibrosis: Secondary | ICD-10-CM | POA: Diagnosis not present

## 2016-05-14 DIAGNOSIS — J9611 Chronic respiratory failure with hypoxia: Secondary | ICD-10-CM | POA: Diagnosis not present

## 2016-05-14 DIAGNOSIS — R0602 Shortness of breath: Secondary | ICD-10-CM | POA: Diagnosis not present

## 2016-05-16 DIAGNOSIS — I4891 Unspecified atrial fibrillation: Secondary | ICD-10-CM | POA: Diagnosis not present

## 2016-05-16 DIAGNOSIS — Z48812 Encounter for surgical aftercare following surgery on the circulatory system: Secondary | ICD-10-CM | POA: Diagnosis not present

## 2016-05-16 DIAGNOSIS — M1991 Primary osteoarthritis, unspecified site: Secondary | ICD-10-CM | POA: Diagnosis not present

## 2016-05-16 DIAGNOSIS — J45909 Unspecified asthma, uncomplicated: Secondary | ICD-10-CM | POA: Diagnosis not present

## 2016-05-16 DIAGNOSIS — J849 Interstitial pulmonary disease, unspecified: Secondary | ICD-10-CM | POA: Diagnosis not present

## 2016-05-16 DIAGNOSIS — D539 Nutritional anemia, unspecified: Secondary | ICD-10-CM | POA: Diagnosis not present

## 2016-05-17 DIAGNOSIS — I4891 Unspecified atrial fibrillation: Secondary | ICD-10-CM | POA: Diagnosis not present

## 2016-05-17 DIAGNOSIS — Z48812 Encounter for surgical aftercare following surgery on the circulatory system: Secondary | ICD-10-CM | POA: Diagnosis not present

## 2016-05-17 DIAGNOSIS — D539 Nutritional anemia, unspecified: Secondary | ICD-10-CM | POA: Diagnosis not present

## 2016-05-17 DIAGNOSIS — J849 Interstitial pulmonary disease, unspecified: Secondary | ICD-10-CM | POA: Diagnosis not present

## 2016-05-17 DIAGNOSIS — M1991 Primary osteoarthritis, unspecified site: Secondary | ICD-10-CM | POA: Diagnosis not present

## 2016-05-17 DIAGNOSIS — J45909 Unspecified asthma, uncomplicated: Secondary | ICD-10-CM | POA: Diagnosis not present

## 2016-05-21 DIAGNOSIS — I4891 Unspecified atrial fibrillation: Secondary | ICD-10-CM | POA: Diagnosis not present

## 2016-05-21 DIAGNOSIS — Z48812 Encounter for surgical aftercare following surgery on the circulatory system: Secondary | ICD-10-CM | POA: Diagnosis not present

## 2016-05-21 DIAGNOSIS — J849 Interstitial pulmonary disease, unspecified: Secondary | ICD-10-CM | POA: Diagnosis not present

## 2016-05-21 DIAGNOSIS — D539 Nutritional anemia, unspecified: Secondary | ICD-10-CM | POA: Diagnosis not present

## 2016-05-21 DIAGNOSIS — J45909 Unspecified asthma, uncomplicated: Secondary | ICD-10-CM | POA: Diagnosis not present

## 2016-05-21 DIAGNOSIS — M1991 Primary osteoarthritis, unspecified site: Secondary | ICD-10-CM | POA: Diagnosis not present

## 2016-05-22 DIAGNOSIS — D539 Nutritional anemia, unspecified: Secondary | ICD-10-CM | POA: Diagnosis not present

## 2016-05-22 DIAGNOSIS — Z48812 Encounter for surgical aftercare following surgery on the circulatory system: Secondary | ICD-10-CM | POA: Diagnosis not present

## 2016-05-22 DIAGNOSIS — J45909 Unspecified asthma, uncomplicated: Secondary | ICD-10-CM | POA: Diagnosis not present

## 2016-05-22 DIAGNOSIS — M1991 Primary osteoarthritis, unspecified site: Secondary | ICD-10-CM | POA: Diagnosis not present

## 2016-05-22 DIAGNOSIS — I4891 Unspecified atrial fibrillation: Secondary | ICD-10-CM | POA: Diagnosis not present

## 2016-05-22 DIAGNOSIS — J849 Interstitial pulmonary disease, unspecified: Secondary | ICD-10-CM | POA: Diagnosis not present

## 2016-05-23 DIAGNOSIS — D539 Nutritional anemia, unspecified: Secondary | ICD-10-CM | POA: Diagnosis not present

## 2016-05-23 DIAGNOSIS — I4891 Unspecified atrial fibrillation: Secondary | ICD-10-CM | POA: Diagnosis not present

## 2016-05-23 DIAGNOSIS — J45909 Unspecified asthma, uncomplicated: Secondary | ICD-10-CM | POA: Diagnosis not present

## 2016-05-23 DIAGNOSIS — Z48812 Encounter for surgical aftercare following surgery on the circulatory system: Secondary | ICD-10-CM | POA: Diagnosis not present

## 2016-05-23 DIAGNOSIS — J849 Interstitial pulmonary disease, unspecified: Secondary | ICD-10-CM | POA: Diagnosis not present

## 2016-05-23 DIAGNOSIS — M1991 Primary osteoarthritis, unspecified site: Secondary | ICD-10-CM | POA: Diagnosis not present

## 2016-05-27 DIAGNOSIS — Z48812 Encounter for surgical aftercare following surgery on the circulatory system: Secondary | ICD-10-CM | POA: Diagnosis not present

## 2016-05-27 DIAGNOSIS — D539 Nutritional anemia, unspecified: Secondary | ICD-10-CM | POA: Diagnosis not present

## 2016-05-27 DIAGNOSIS — J45909 Unspecified asthma, uncomplicated: Secondary | ICD-10-CM | POA: Diagnosis not present

## 2016-05-27 DIAGNOSIS — M1991 Primary osteoarthritis, unspecified site: Secondary | ICD-10-CM | POA: Diagnosis not present

## 2016-05-27 DIAGNOSIS — I4891 Unspecified atrial fibrillation: Secondary | ICD-10-CM | POA: Diagnosis not present

## 2016-05-27 DIAGNOSIS — J849 Interstitial pulmonary disease, unspecified: Secondary | ICD-10-CM | POA: Diagnosis not present

## 2016-05-28 DIAGNOSIS — I4891 Unspecified atrial fibrillation: Secondary | ICD-10-CM | POA: Diagnosis not present

## 2016-05-28 DIAGNOSIS — M1991 Primary osteoarthritis, unspecified site: Secondary | ICD-10-CM | POA: Diagnosis not present

## 2016-05-28 DIAGNOSIS — Z48812 Encounter for surgical aftercare following surgery on the circulatory system: Secondary | ICD-10-CM | POA: Diagnosis not present

## 2016-05-28 DIAGNOSIS — J849 Interstitial pulmonary disease, unspecified: Secondary | ICD-10-CM | POA: Diagnosis not present

## 2016-05-28 DIAGNOSIS — J45909 Unspecified asthma, uncomplicated: Secondary | ICD-10-CM | POA: Diagnosis not present

## 2016-05-28 DIAGNOSIS — D539 Nutritional anemia, unspecified: Secondary | ICD-10-CM | POA: Diagnosis not present

## 2016-05-29 DIAGNOSIS — M1991 Primary osteoarthritis, unspecified site: Secondary | ICD-10-CM | POA: Diagnosis not present

## 2016-05-29 DIAGNOSIS — I4891 Unspecified atrial fibrillation: Secondary | ICD-10-CM | POA: Diagnosis not present

## 2016-05-29 DIAGNOSIS — J45909 Unspecified asthma, uncomplicated: Secondary | ICD-10-CM | POA: Diagnosis not present

## 2016-05-29 DIAGNOSIS — D539 Nutritional anemia, unspecified: Secondary | ICD-10-CM | POA: Diagnosis not present

## 2016-05-29 DIAGNOSIS — J849 Interstitial pulmonary disease, unspecified: Secondary | ICD-10-CM | POA: Diagnosis not present

## 2016-05-29 DIAGNOSIS — Z48812 Encounter for surgical aftercare following surgery on the circulatory system: Secondary | ICD-10-CM | POA: Diagnosis not present

## 2016-05-30 DIAGNOSIS — R300 Dysuria: Secondary | ICD-10-CM | POA: Diagnosis not present

## 2016-05-30 DIAGNOSIS — R350 Frequency of micturition: Secondary | ICD-10-CM | POA: Diagnosis not present

## 2016-06-03 DIAGNOSIS — Z48812 Encounter for surgical aftercare following surgery on the circulatory system: Secondary | ICD-10-CM | POA: Diagnosis not present

## 2016-06-03 DIAGNOSIS — D539 Nutritional anemia, unspecified: Secondary | ICD-10-CM | POA: Diagnosis not present

## 2016-06-03 DIAGNOSIS — J45909 Unspecified asthma, uncomplicated: Secondary | ICD-10-CM | POA: Diagnosis not present

## 2016-06-03 DIAGNOSIS — I4891 Unspecified atrial fibrillation: Secondary | ICD-10-CM | POA: Diagnosis not present

## 2016-06-03 DIAGNOSIS — M1991 Primary osteoarthritis, unspecified site: Secondary | ICD-10-CM | POA: Diagnosis not present

## 2016-06-03 DIAGNOSIS — J849 Interstitial pulmonary disease, unspecified: Secondary | ICD-10-CM | POA: Diagnosis not present

## 2016-06-12 DIAGNOSIS — J45909 Unspecified asthma, uncomplicated: Secondary | ICD-10-CM | POA: Diagnosis not present

## 2016-06-12 DIAGNOSIS — I4891 Unspecified atrial fibrillation: Secondary | ICD-10-CM | POA: Diagnosis not present

## 2016-06-12 DIAGNOSIS — Z48812 Encounter for surgical aftercare following surgery on the circulatory system: Secondary | ICD-10-CM | POA: Diagnosis not present

## 2016-06-12 DIAGNOSIS — M1991 Primary osteoarthritis, unspecified site: Secondary | ICD-10-CM | POA: Diagnosis not present

## 2016-06-12 DIAGNOSIS — J849 Interstitial pulmonary disease, unspecified: Secondary | ICD-10-CM | POA: Diagnosis not present

## 2016-06-12 DIAGNOSIS — D539 Nutritional anemia, unspecified: Secondary | ICD-10-CM | POA: Diagnosis not present

## 2016-06-13 DIAGNOSIS — J849 Interstitial pulmonary disease, unspecified: Secondary | ICD-10-CM | POA: Diagnosis not present

## 2016-06-13 DIAGNOSIS — D539 Nutritional anemia, unspecified: Secondary | ICD-10-CM | POA: Diagnosis not present

## 2016-06-13 DIAGNOSIS — J45909 Unspecified asthma, uncomplicated: Secondary | ICD-10-CM | POA: Diagnosis not present

## 2016-06-13 DIAGNOSIS — I4891 Unspecified atrial fibrillation: Secondary | ICD-10-CM | POA: Diagnosis not present

## 2016-06-13 DIAGNOSIS — Z48812 Encounter for surgical aftercare following surgery on the circulatory system: Secondary | ICD-10-CM | POA: Diagnosis not present

## 2016-06-13 DIAGNOSIS — M1991 Primary osteoarthritis, unspecified site: Secondary | ICD-10-CM | POA: Diagnosis not present

## 2016-06-14 DIAGNOSIS — D539 Nutritional anemia, unspecified: Secondary | ICD-10-CM | POA: Diagnosis not present

## 2016-06-14 DIAGNOSIS — I4891 Unspecified atrial fibrillation: Secondary | ICD-10-CM | POA: Diagnosis not present

## 2016-06-14 DIAGNOSIS — J849 Interstitial pulmonary disease, unspecified: Secondary | ICD-10-CM | POA: Diagnosis not present

## 2016-06-14 DIAGNOSIS — Z48812 Encounter for surgical aftercare following surgery on the circulatory system: Secondary | ICD-10-CM | POA: Diagnosis not present

## 2016-06-14 DIAGNOSIS — M1991 Primary osteoarthritis, unspecified site: Secondary | ICD-10-CM | POA: Diagnosis not present

## 2016-06-14 DIAGNOSIS — J45909 Unspecified asthma, uncomplicated: Secondary | ICD-10-CM | POA: Diagnosis not present

## 2016-06-19 DIAGNOSIS — M1991 Primary osteoarthritis, unspecified site: Secondary | ICD-10-CM | POA: Diagnosis not present

## 2016-06-19 DIAGNOSIS — Z48812 Encounter for surgical aftercare following surgery on the circulatory system: Secondary | ICD-10-CM | POA: Diagnosis not present

## 2016-06-19 DIAGNOSIS — J849 Interstitial pulmonary disease, unspecified: Secondary | ICD-10-CM | POA: Diagnosis not present

## 2016-06-19 DIAGNOSIS — D539 Nutritional anemia, unspecified: Secondary | ICD-10-CM | POA: Diagnosis not present

## 2016-06-19 DIAGNOSIS — I4891 Unspecified atrial fibrillation: Secondary | ICD-10-CM | POA: Diagnosis not present

## 2016-06-19 DIAGNOSIS — J45909 Unspecified asthma, uncomplicated: Secondary | ICD-10-CM | POA: Diagnosis not present

## 2016-06-20 DIAGNOSIS — J45909 Unspecified asthma, uncomplicated: Secondary | ICD-10-CM | POA: Diagnosis not present

## 2016-06-20 DIAGNOSIS — D539 Nutritional anemia, unspecified: Secondary | ICD-10-CM | POA: Diagnosis not present

## 2016-06-20 DIAGNOSIS — M1991 Primary osteoarthritis, unspecified site: Secondary | ICD-10-CM | POA: Diagnosis not present

## 2016-06-20 DIAGNOSIS — J849 Interstitial pulmonary disease, unspecified: Secondary | ICD-10-CM | POA: Diagnosis not present

## 2016-06-20 DIAGNOSIS — Z48812 Encounter for surgical aftercare following surgery on the circulatory system: Secondary | ICD-10-CM | POA: Diagnosis not present

## 2016-06-20 DIAGNOSIS — I4891 Unspecified atrial fibrillation: Secondary | ICD-10-CM | POA: Diagnosis not present

## 2016-06-21 DIAGNOSIS — M1991 Primary osteoarthritis, unspecified site: Secondary | ICD-10-CM | POA: Diagnosis not present

## 2016-06-21 DIAGNOSIS — D539 Nutritional anemia, unspecified: Secondary | ICD-10-CM | POA: Diagnosis not present

## 2016-06-21 DIAGNOSIS — J849 Interstitial pulmonary disease, unspecified: Secondary | ICD-10-CM | POA: Diagnosis not present

## 2016-06-21 DIAGNOSIS — N3000 Acute cystitis without hematuria: Secondary | ICD-10-CM | POA: Diagnosis not present

## 2016-06-21 DIAGNOSIS — J45909 Unspecified asthma, uncomplicated: Secondary | ICD-10-CM | POA: Diagnosis not present

## 2016-06-21 DIAGNOSIS — I4891 Unspecified atrial fibrillation: Secondary | ICD-10-CM | POA: Diagnosis not present

## 2016-06-21 DIAGNOSIS — Z48812 Encounter for surgical aftercare following surgery on the circulatory system: Secondary | ICD-10-CM | POA: Diagnosis not present

## 2016-06-22 DIAGNOSIS — J849 Interstitial pulmonary disease, unspecified: Secondary | ICD-10-CM | POA: Diagnosis not present

## 2016-06-22 DIAGNOSIS — I10 Essential (primary) hypertension: Secondary | ICD-10-CM | POA: Diagnosis not present

## 2016-06-22 DIAGNOSIS — M6281 Muscle weakness (generalized): Secondary | ICD-10-CM | POA: Diagnosis not present

## 2016-06-22 DIAGNOSIS — J45909 Unspecified asthma, uncomplicated: Secondary | ICD-10-CM | POA: Diagnosis not present

## 2016-06-22 DIAGNOSIS — I25118 Atherosclerotic heart disease of native coronary artery with other forms of angina pectoris: Secondary | ICD-10-CM | POA: Diagnosis not present

## 2016-06-22 DIAGNOSIS — I82402 Acute embolism and thrombosis of unspecified deep veins of left lower extremity: Secondary | ICD-10-CM | POA: Diagnosis not present

## 2016-07-01 DIAGNOSIS — R0902 Hypoxemia: Secondary | ICD-10-CM | POA: Diagnosis not present

## 2016-07-01 DIAGNOSIS — I509 Heart failure, unspecified: Secondary | ICD-10-CM | POA: Diagnosis not present

## 2016-07-01 DIAGNOSIS — J208 Acute bronchitis due to other specified organisms: Secondary | ICD-10-CM | POA: Diagnosis not present

## 2016-07-01 DIAGNOSIS — J84112 Idiopathic pulmonary fibrosis: Secondary | ICD-10-CM | POA: Diagnosis not present

## 2016-07-01 DIAGNOSIS — I5032 Chronic diastolic (congestive) heart failure: Secondary | ICD-10-CM | POA: Diagnosis not present

## 2016-07-01 DIAGNOSIS — J8410 Pulmonary fibrosis, unspecified: Secondary | ICD-10-CM | POA: Diagnosis not present

## 2016-07-01 DIAGNOSIS — J40 Bronchitis, not specified as acute or chronic: Secondary | ICD-10-CM | POA: Diagnosis not present

## 2016-07-01 DIAGNOSIS — I10 Essential (primary) hypertension: Secondary | ICD-10-CM | POA: Diagnosis not present

## 2016-07-04 DIAGNOSIS — I82402 Acute embolism and thrombosis of unspecified deep veins of left lower extremity: Secondary | ICD-10-CM | POA: Diagnosis not present

## 2016-07-04 DIAGNOSIS — M6281 Muscle weakness (generalized): Secondary | ICD-10-CM | POA: Diagnosis not present

## 2016-07-04 DIAGNOSIS — J45909 Unspecified asthma, uncomplicated: Secondary | ICD-10-CM | POA: Diagnosis not present

## 2016-07-04 DIAGNOSIS — J849 Interstitial pulmonary disease, unspecified: Secondary | ICD-10-CM | POA: Diagnosis not present

## 2016-07-04 DIAGNOSIS — I10 Essential (primary) hypertension: Secondary | ICD-10-CM | POA: Diagnosis not present

## 2016-07-04 DIAGNOSIS — I25118 Atherosclerotic heart disease of native coronary artery with other forms of angina pectoris: Secondary | ICD-10-CM | POA: Diagnosis not present

## 2016-07-05 DIAGNOSIS — I10 Essential (primary) hypertension: Secondary | ICD-10-CM | POA: Diagnosis not present

## 2016-07-05 DIAGNOSIS — R0902 Hypoxemia: Secondary | ICD-10-CM | POA: Diagnosis not present

## 2016-07-05 DIAGNOSIS — I5032 Chronic diastolic (congestive) heart failure: Secondary | ICD-10-CM | POA: Diagnosis not present

## 2016-07-05 DIAGNOSIS — J208 Acute bronchitis due to other specified organisms: Secondary | ICD-10-CM | POA: Diagnosis not present

## 2016-07-05 DIAGNOSIS — J84112 Idiopathic pulmonary fibrosis: Secondary | ICD-10-CM | POA: Diagnosis not present

## 2016-07-09 DIAGNOSIS — I82402 Acute embolism and thrombosis of unspecified deep veins of left lower extremity: Secondary | ICD-10-CM | POA: Diagnosis not present

## 2016-07-09 DIAGNOSIS — J849 Interstitial pulmonary disease, unspecified: Secondary | ICD-10-CM | POA: Diagnosis not present

## 2016-07-09 DIAGNOSIS — I10 Essential (primary) hypertension: Secondary | ICD-10-CM | POA: Diagnosis not present

## 2016-07-09 DIAGNOSIS — M6281 Muscle weakness (generalized): Secondary | ICD-10-CM | POA: Diagnosis not present

## 2016-07-09 DIAGNOSIS — I25118 Atherosclerotic heart disease of native coronary artery with other forms of angina pectoris: Secondary | ICD-10-CM | POA: Diagnosis not present

## 2016-07-09 DIAGNOSIS — J45909 Unspecified asthma, uncomplicated: Secondary | ICD-10-CM | POA: Diagnosis not present

## 2016-07-12 DIAGNOSIS — I25118 Atherosclerotic heart disease of native coronary artery with other forms of angina pectoris: Secondary | ICD-10-CM | POA: Diagnosis not present

## 2016-07-12 DIAGNOSIS — J849 Interstitial pulmonary disease, unspecified: Secondary | ICD-10-CM | POA: Diagnosis not present

## 2016-07-12 DIAGNOSIS — J45909 Unspecified asthma, uncomplicated: Secondary | ICD-10-CM | POA: Diagnosis not present

## 2016-07-12 DIAGNOSIS — M6281 Muscle weakness (generalized): Secondary | ICD-10-CM | POA: Diagnosis not present

## 2016-07-12 DIAGNOSIS — I10 Essential (primary) hypertension: Secondary | ICD-10-CM | POA: Diagnosis not present

## 2016-07-12 DIAGNOSIS — I82402 Acute embolism and thrombosis of unspecified deep veins of left lower extremity: Secondary | ICD-10-CM | POA: Diagnosis not present

## 2016-07-15 DIAGNOSIS — Z8719 Personal history of other diseases of the digestive system: Secondary | ICD-10-CM | POA: Diagnosis not present

## 2016-07-15 DIAGNOSIS — I82402 Acute embolism and thrombosis of unspecified deep veins of left lower extremity: Secondary | ICD-10-CM | POA: Diagnosis not present

## 2016-07-15 DIAGNOSIS — J45909 Unspecified asthma, uncomplicated: Secondary | ICD-10-CM | POA: Diagnosis not present

## 2016-07-15 DIAGNOSIS — Z9981 Dependence on supplemental oxygen: Secondary | ICD-10-CM | POA: Diagnosis not present

## 2016-07-15 DIAGNOSIS — Z950 Presence of cardiac pacemaker: Secondary | ICD-10-CM | POA: Diagnosis not present

## 2016-07-15 DIAGNOSIS — Z7901 Long term (current) use of anticoagulants: Secondary | ICD-10-CM | POA: Diagnosis not present

## 2016-07-15 DIAGNOSIS — J849 Interstitial pulmonary disease, unspecified: Secondary | ICD-10-CM | POA: Diagnosis not present

## 2016-07-15 DIAGNOSIS — I25118 Atherosclerotic heart disease of native coronary artery with other forms of angina pectoris: Secondary | ICD-10-CM | POA: Diagnosis not present

## 2016-07-15 DIAGNOSIS — R32 Unspecified urinary incontinence: Secondary | ICD-10-CM | POA: Diagnosis not present

## 2016-07-15 DIAGNOSIS — M6281 Muscle weakness (generalized): Secondary | ICD-10-CM | POA: Diagnosis not present

## 2016-07-15 DIAGNOSIS — I10 Essential (primary) hypertension: Secondary | ICD-10-CM | POA: Diagnosis not present

## 2016-07-16 DIAGNOSIS — I25118 Atherosclerotic heart disease of native coronary artery with other forms of angina pectoris: Secondary | ICD-10-CM | POA: Diagnosis not present

## 2016-07-16 DIAGNOSIS — J849 Interstitial pulmonary disease, unspecified: Secondary | ICD-10-CM | POA: Diagnosis not present

## 2016-07-16 DIAGNOSIS — I10 Essential (primary) hypertension: Secondary | ICD-10-CM | POA: Diagnosis not present

## 2016-07-16 DIAGNOSIS — M6281 Muscle weakness (generalized): Secondary | ICD-10-CM | POA: Diagnosis not present

## 2016-07-16 DIAGNOSIS — J45909 Unspecified asthma, uncomplicated: Secondary | ICD-10-CM | POA: Diagnosis not present

## 2016-07-16 DIAGNOSIS — I82402 Acute embolism and thrombosis of unspecified deep veins of left lower extremity: Secondary | ICD-10-CM | POA: Diagnosis not present

## 2016-07-17 DIAGNOSIS — I10 Essential (primary) hypertension: Secondary | ICD-10-CM | POA: Diagnosis not present

## 2016-07-17 DIAGNOSIS — J45909 Unspecified asthma, uncomplicated: Secondary | ICD-10-CM | POA: Diagnosis not present

## 2016-07-17 DIAGNOSIS — I82402 Acute embolism and thrombosis of unspecified deep veins of left lower extremity: Secondary | ICD-10-CM | POA: Diagnosis not present

## 2016-07-17 DIAGNOSIS — J849 Interstitial pulmonary disease, unspecified: Secondary | ICD-10-CM | POA: Diagnosis not present

## 2016-07-17 DIAGNOSIS — I25118 Atherosclerotic heart disease of native coronary artery with other forms of angina pectoris: Secondary | ICD-10-CM | POA: Diagnosis not present

## 2016-07-17 DIAGNOSIS — M6281 Muscle weakness (generalized): Secondary | ICD-10-CM | POA: Diagnosis not present

## 2016-07-18 DIAGNOSIS — J84112 Idiopathic pulmonary fibrosis: Secondary | ICD-10-CM | POA: Diagnosis not present

## 2016-07-18 DIAGNOSIS — R0602 Shortness of breath: Secondary | ICD-10-CM | POA: Diagnosis not present

## 2016-07-18 DIAGNOSIS — J9611 Chronic respiratory failure with hypoxia: Secondary | ICD-10-CM | POA: Diagnosis not present

## 2016-07-23 DIAGNOSIS — I10 Essential (primary) hypertension: Secondary | ICD-10-CM | POA: Diagnosis not present

## 2016-07-23 DIAGNOSIS — J849 Interstitial pulmonary disease, unspecified: Secondary | ICD-10-CM | POA: Diagnosis not present

## 2016-07-23 DIAGNOSIS — J45909 Unspecified asthma, uncomplicated: Secondary | ICD-10-CM | POA: Diagnosis not present

## 2016-07-23 DIAGNOSIS — I25118 Atherosclerotic heart disease of native coronary artery with other forms of angina pectoris: Secondary | ICD-10-CM | POA: Diagnosis not present

## 2016-07-23 DIAGNOSIS — I82402 Acute embolism and thrombosis of unspecified deep veins of left lower extremity: Secondary | ICD-10-CM | POA: Diagnosis not present

## 2016-07-23 DIAGNOSIS — M6281 Muscle weakness (generalized): Secondary | ICD-10-CM | POA: Diagnosis not present

## 2016-07-25 DIAGNOSIS — I10 Essential (primary) hypertension: Secondary | ICD-10-CM | POA: Diagnosis not present

## 2016-07-25 DIAGNOSIS — J45909 Unspecified asthma, uncomplicated: Secondary | ICD-10-CM | POA: Diagnosis not present

## 2016-07-25 DIAGNOSIS — I25118 Atherosclerotic heart disease of native coronary artery with other forms of angina pectoris: Secondary | ICD-10-CM | POA: Diagnosis not present

## 2016-07-25 DIAGNOSIS — I82402 Acute embolism and thrombosis of unspecified deep veins of left lower extremity: Secondary | ICD-10-CM | POA: Diagnosis not present

## 2016-07-25 DIAGNOSIS — J849 Interstitial pulmonary disease, unspecified: Secondary | ICD-10-CM | POA: Diagnosis not present

## 2016-07-25 DIAGNOSIS — M6281 Muscle weakness (generalized): Secondary | ICD-10-CM | POA: Diagnosis not present

## 2016-07-26 DIAGNOSIS — I10 Essential (primary) hypertension: Secondary | ICD-10-CM | POA: Diagnosis not present

## 2016-07-26 DIAGNOSIS — E039 Hypothyroidism, unspecified: Secondary | ICD-10-CM | POA: Diagnosis not present

## 2016-07-26 DIAGNOSIS — I509 Heart failure, unspecified: Secondary | ICD-10-CM | POA: Diagnosis not present

## 2016-07-26 DIAGNOSIS — D510 Vitamin B12 deficiency anemia due to intrinsic factor deficiency: Secondary | ICD-10-CM | POA: Diagnosis not present

## 2016-07-26 DIAGNOSIS — J209 Acute bronchitis, unspecified: Secondary | ICD-10-CM | POA: Diagnosis not present

## 2016-07-26 DIAGNOSIS — D518 Other vitamin B12 deficiency anemias: Secondary | ICD-10-CM | POA: Diagnosis not present

## 2016-07-27 DIAGNOSIS — J181 Lobar pneumonia, unspecified organism: Secondary | ICD-10-CM | POA: Diagnosis not present

## 2016-07-28 DIAGNOSIS — J181 Lobar pneumonia, unspecified organism: Secondary | ICD-10-CM | POA: Diagnosis not present

## 2016-07-29 DIAGNOSIS — I509 Heart failure, unspecified: Secondary | ICD-10-CM | POA: Diagnosis not present

## 2016-07-29 DIAGNOSIS — I110 Hypertensive heart disease with heart failure: Secondary | ICD-10-CM | POA: Diagnosis not present

## 2016-07-29 DIAGNOSIS — J189 Pneumonia, unspecified organism: Secondary | ICD-10-CM | POA: Diagnosis not present

## 2016-07-29 DIAGNOSIS — E039 Hypothyroidism, unspecified: Secondary | ICD-10-CM | POA: Diagnosis not present

## 2016-07-29 DIAGNOSIS — J209 Acute bronchitis, unspecified: Secondary | ICD-10-CM | POA: Diagnosis not present

## 2016-07-30 DIAGNOSIS — I82402 Acute embolism and thrombosis of unspecified deep veins of left lower extremity: Secondary | ICD-10-CM | POA: Diagnosis not present

## 2016-07-30 DIAGNOSIS — I25118 Atherosclerotic heart disease of native coronary artery with other forms of angina pectoris: Secondary | ICD-10-CM | POA: Diagnosis not present

## 2016-07-30 DIAGNOSIS — J45909 Unspecified asthma, uncomplicated: Secondary | ICD-10-CM | POA: Diagnosis not present

## 2016-07-30 DIAGNOSIS — J849 Interstitial pulmonary disease, unspecified: Secondary | ICD-10-CM | POA: Diagnosis not present

## 2016-07-30 DIAGNOSIS — M6281 Muscle weakness (generalized): Secondary | ICD-10-CM | POA: Diagnosis not present

## 2016-07-30 DIAGNOSIS — I10 Essential (primary) hypertension: Secondary | ICD-10-CM | POA: Diagnosis not present

## 2016-07-30 DIAGNOSIS — J189 Pneumonia, unspecified organism: Secondary | ICD-10-CM | POA: Diagnosis not present

## 2016-08-01 DIAGNOSIS — R112 Nausea with vomiting, unspecified: Secondary | ICD-10-CM | POA: Diagnosis not present

## 2016-08-01 DIAGNOSIS — R9431 Abnormal electrocardiogram [ECG] [EKG]: Secondary | ICD-10-CM | POA: Diagnosis not present

## 2016-08-01 DIAGNOSIS — M6281 Muscle weakness (generalized): Secondary | ICD-10-CM | POA: Diagnosis not present

## 2016-08-01 DIAGNOSIS — K219 Gastro-esophageal reflux disease without esophagitis: Secondary | ICD-10-CM | POA: Diagnosis not present

## 2016-08-01 DIAGNOSIS — I25118 Atherosclerotic heart disease of native coronary artery with other forms of angina pectoris: Secondary | ICD-10-CM | POA: Diagnosis not present

## 2016-08-01 DIAGNOSIS — J849 Interstitial pulmonary disease, unspecified: Secondary | ICD-10-CM | POA: Diagnosis not present

## 2016-08-01 DIAGNOSIS — E784 Other hyperlipidemia: Secondary | ICD-10-CM | POA: Diagnosis not present

## 2016-08-01 DIAGNOSIS — I82402 Acute embolism and thrombosis of unspecified deep veins of left lower extremity: Secondary | ICD-10-CM | POA: Diagnosis not present

## 2016-08-01 DIAGNOSIS — E039 Hypothyroidism, unspecified: Secondary | ICD-10-CM | POA: Diagnosis not present

## 2016-08-01 DIAGNOSIS — I10 Essential (primary) hypertension: Secondary | ICD-10-CM | POA: Diagnosis not present

## 2016-08-01 DIAGNOSIS — Z8249 Family history of ischemic heart disease and other diseases of the circulatory system: Secondary | ICD-10-CM | POA: Diagnosis not present

## 2016-08-01 DIAGNOSIS — J45909 Unspecified asthma, uncomplicated: Secondary | ICD-10-CM | POA: Diagnosis not present

## 2016-08-01 DIAGNOSIS — G4700 Insomnia, unspecified: Secondary | ICD-10-CM | POA: Diagnosis not present

## 2016-08-06 DIAGNOSIS — I25118 Atherosclerotic heart disease of native coronary artery with other forms of angina pectoris: Secondary | ICD-10-CM | POA: Diagnosis not present

## 2016-08-06 DIAGNOSIS — I10 Essential (primary) hypertension: Secondary | ICD-10-CM | POA: Diagnosis not present

## 2016-08-06 DIAGNOSIS — M6281 Muscle weakness (generalized): Secondary | ICD-10-CM | POA: Diagnosis not present

## 2016-08-06 DIAGNOSIS — J45909 Unspecified asthma, uncomplicated: Secondary | ICD-10-CM | POA: Diagnosis not present

## 2016-08-06 DIAGNOSIS — I82402 Acute embolism and thrombosis of unspecified deep veins of left lower extremity: Secondary | ICD-10-CM | POA: Diagnosis not present

## 2016-08-06 DIAGNOSIS — J849 Interstitial pulmonary disease, unspecified: Secondary | ICD-10-CM | POA: Diagnosis not present

## 2016-08-08 DIAGNOSIS — J45909 Unspecified asthma, uncomplicated: Secondary | ICD-10-CM | POA: Diagnosis not present

## 2016-08-08 DIAGNOSIS — J849 Interstitial pulmonary disease, unspecified: Secondary | ICD-10-CM | POA: Diagnosis not present

## 2016-08-08 DIAGNOSIS — I25118 Atherosclerotic heart disease of native coronary artery with other forms of angina pectoris: Secondary | ICD-10-CM | POA: Diagnosis not present

## 2016-08-08 DIAGNOSIS — I10 Essential (primary) hypertension: Secondary | ICD-10-CM | POA: Diagnosis not present

## 2016-08-08 DIAGNOSIS — M6281 Muscle weakness (generalized): Secondary | ICD-10-CM | POA: Diagnosis not present

## 2016-08-08 DIAGNOSIS — I82402 Acute embolism and thrombosis of unspecified deep veins of left lower extremity: Secondary | ICD-10-CM | POA: Diagnosis not present

## 2016-08-13 DIAGNOSIS — M6281 Muscle weakness (generalized): Secondary | ICD-10-CM | POA: Diagnosis not present

## 2016-08-13 DIAGNOSIS — I10 Essential (primary) hypertension: Secondary | ICD-10-CM | POA: Diagnosis not present

## 2016-08-13 DIAGNOSIS — I25118 Atherosclerotic heart disease of native coronary artery with other forms of angina pectoris: Secondary | ICD-10-CM | POA: Diagnosis not present

## 2016-08-13 DIAGNOSIS — I82402 Acute embolism and thrombosis of unspecified deep veins of left lower extremity: Secondary | ICD-10-CM | POA: Diagnosis not present

## 2016-08-13 DIAGNOSIS — J849 Interstitial pulmonary disease, unspecified: Secondary | ICD-10-CM | POA: Diagnosis not present

## 2016-08-13 DIAGNOSIS — J45909 Unspecified asthma, uncomplicated: Secondary | ICD-10-CM | POA: Diagnosis not present

## 2016-08-16 DIAGNOSIS — I25118 Atherosclerotic heart disease of native coronary artery with other forms of angina pectoris: Secondary | ICD-10-CM | POA: Diagnosis not present

## 2016-08-16 DIAGNOSIS — J45909 Unspecified asthma, uncomplicated: Secondary | ICD-10-CM | POA: Diagnosis not present

## 2016-08-16 DIAGNOSIS — J849 Interstitial pulmonary disease, unspecified: Secondary | ICD-10-CM | POA: Diagnosis not present

## 2016-08-16 DIAGNOSIS — I10 Essential (primary) hypertension: Secondary | ICD-10-CM | POA: Diagnosis not present

## 2016-08-16 DIAGNOSIS — M6281 Muscle weakness (generalized): Secondary | ICD-10-CM | POA: Diagnosis not present

## 2016-08-16 DIAGNOSIS — I82402 Acute embolism and thrombosis of unspecified deep veins of left lower extremity: Secondary | ICD-10-CM | POA: Diagnosis not present

## 2016-08-21 DIAGNOSIS — I4891 Unspecified atrial fibrillation: Secondary | ICD-10-CM | POA: Diagnosis not present

## 2016-08-21 DIAGNOSIS — I10 Essential (primary) hypertension: Secondary | ICD-10-CM | POA: Diagnosis not present

## 2016-08-21 DIAGNOSIS — I25118 Atherosclerotic heart disease of native coronary artery with other forms of angina pectoris: Secondary | ICD-10-CM | POA: Diagnosis not present

## 2016-08-21 DIAGNOSIS — I82402 Acute embolism and thrombosis of unspecified deep veins of left lower extremity: Secondary | ICD-10-CM | POA: Diagnosis not present

## 2016-08-21 DIAGNOSIS — J849 Interstitial pulmonary disease, unspecified: Secondary | ICD-10-CM | POA: Diagnosis not present

## 2016-08-21 DIAGNOSIS — J45909 Unspecified asthma, uncomplicated: Secondary | ICD-10-CM | POA: Diagnosis not present

## 2016-08-22 DIAGNOSIS — I10 Essential (primary) hypertension: Secondary | ICD-10-CM | POA: Diagnosis not present

## 2016-08-22 DIAGNOSIS — I4891 Unspecified atrial fibrillation: Secondary | ICD-10-CM | POA: Diagnosis not present

## 2016-08-22 DIAGNOSIS — I82402 Acute embolism and thrombosis of unspecified deep veins of left lower extremity: Secondary | ICD-10-CM | POA: Diagnosis not present

## 2016-08-22 DIAGNOSIS — J45909 Unspecified asthma, uncomplicated: Secondary | ICD-10-CM | POA: Diagnosis not present

## 2016-08-22 DIAGNOSIS — J849 Interstitial pulmonary disease, unspecified: Secondary | ICD-10-CM | POA: Diagnosis not present

## 2016-08-22 DIAGNOSIS — I25118 Atherosclerotic heart disease of native coronary artery with other forms of angina pectoris: Secondary | ICD-10-CM | POA: Diagnosis not present

## 2016-08-23 DIAGNOSIS — E079 Disorder of thyroid, unspecified: Secondary | ICD-10-CM | POA: Diagnosis not present

## 2016-08-23 DIAGNOSIS — I482 Chronic atrial fibrillation: Secondary | ICD-10-CM | POA: Diagnosis not present

## 2016-08-23 DIAGNOSIS — Z9889 Other specified postprocedural states: Secondary | ICD-10-CM | POA: Diagnosis not present

## 2016-08-23 DIAGNOSIS — Z45018 Encounter for adjustment and management of other part of cardiac pacemaker: Secondary | ICD-10-CM | POA: Diagnosis not present

## 2016-08-29 DIAGNOSIS — I25118 Atherosclerotic heart disease of native coronary artery with other forms of angina pectoris: Secondary | ICD-10-CM | POA: Diagnosis not present

## 2016-08-29 DIAGNOSIS — I10 Essential (primary) hypertension: Secondary | ICD-10-CM | POA: Diagnosis not present

## 2016-08-29 DIAGNOSIS — I4891 Unspecified atrial fibrillation: Secondary | ICD-10-CM | POA: Diagnosis not present

## 2016-08-29 DIAGNOSIS — J45909 Unspecified asthma, uncomplicated: Secondary | ICD-10-CM | POA: Diagnosis not present

## 2016-08-29 DIAGNOSIS — J849 Interstitial pulmonary disease, unspecified: Secondary | ICD-10-CM | POA: Diagnosis not present

## 2016-08-29 DIAGNOSIS — I82402 Acute embolism and thrombosis of unspecified deep veins of left lower extremity: Secondary | ICD-10-CM | POA: Diagnosis not present

## 2016-08-30 DIAGNOSIS — J45909 Unspecified asthma, uncomplicated: Secondary | ICD-10-CM | POA: Diagnosis not present

## 2016-08-30 DIAGNOSIS — I4891 Unspecified atrial fibrillation: Secondary | ICD-10-CM | POA: Diagnosis not present

## 2016-08-30 DIAGNOSIS — J849 Interstitial pulmonary disease, unspecified: Secondary | ICD-10-CM | POA: Diagnosis not present

## 2016-08-30 DIAGNOSIS — I10 Essential (primary) hypertension: Secondary | ICD-10-CM | POA: Diagnosis not present

## 2016-08-30 DIAGNOSIS — Z23 Encounter for immunization: Secondary | ICD-10-CM | POA: Diagnosis not present

## 2016-08-30 DIAGNOSIS — I25118 Atherosclerotic heart disease of native coronary artery with other forms of angina pectoris: Secondary | ICD-10-CM | POA: Diagnosis not present

## 2016-08-30 DIAGNOSIS — I82402 Acute embolism and thrombosis of unspecified deep veins of left lower extremity: Secondary | ICD-10-CM | POA: Diagnosis not present

## 2016-09-03 DIAGNOSIS — J849 Interstitial pulmonary disease, unspecified: Secondary | ICD-10-CM | POA: Diagnosis not present

## 2016-09-03 DIAGNOSIS — I82402 Acute embolism and thrombosis of unspecified deep veins of left lower extremity: Secondary | ICD-10-CM | POA: Diagnosis not present

## 2016-09-03 DIAGNOSIS — I25118 Atherosclerotic heart disease of native coronary artery with other forms of angina pectoris: Secondary | ICD-10-CM | POA: Diagnosis not present

## 2016-09-03 DIAGNOSIS — I10 Essential (primary) hypertension: Secondary | ICD-10-CM | POA: Diagnosis not present

## 2016-09-03 DIAGNOSIS — J45909 Unspecified asthma, uncomplicated: Secondary | ICD-10-CM | POA: Diagnosis not present

## 2016-09-03 DIAGNOSIS — I4891 Unspecified atrial fibrillation: Secondary | ICD-10-CM | POA: Diagnosis not present

## 2016-09-05 DIAGNOSIS — I82402 Acute embolism and thrombosis of unspecified deep veins of left lower extremity: Secondary | ICD-10-CM | POA: Diagnosis not present

## 2016-09-05 DIAGNOSIS — J849 Interstitial pulmonary disease, unspecified: Secondary | ICD-10-CM | POA: Diagnosis not present

## 2016-09-05 DIAGNOSIS — I10 Essential (primary) hypertension: Secondary | ICD-10-CM | POA: Diagnosis not present

## 2016-09-05 DIAGNOSIS — J45909 Unspecified asthma, uncomplicated: Secondary | ICD-10-CM | POA: Diagnosis not present

## 2016-09-05 DIAGNOSIS — I25118 Atherosclerotic heart disease of native coronary artery with other forms of angina pectoris: Secondary | ICD-10-CM | POA: Diagnosis not present

## 2016-09-05 DIAGNOSIS — I4891 Unspecified atrial fibrillation: Secondary | ICD-10-CM | POA: Diagnosis not present

## 2016-09-09 DIAGNOSIS — I071 Rheumatic tricuspid insufficiency: Secondary | ICD-10-CM | POA: Diagnosis not present

## 2016-09-09 DIAGNOSIS — I371 Nonrheumatic pulmonary valve insufficiency: Secondary | ICD-10-CM | POA: Diagnosis not present

## 2016-09-09 DIAGNOSIS — Z9889 Other specified postprocedural states: Secondary | ICD-10-CM | POA: Diagnosis not present

## 2016-09-09 DIAGNOSIS — I482 Chronic atrial fibrillation: Secondary | ICD-10-CM | POA: Diagnosis not present

## 2016-09-09 DIAGNOSIS — I517 Cardiomegaly: Secondary | ICD-10-CM | POA: Diagnosis not present

## 2016-09-09 DIAGNOSIS — Z45018 Encounter for adjustment and management of other part of cardiac pacemaker: Secondary | ICD-10-CM | POA: Diagnosis not present

## 2016-09-17 DIAGNOSIS — I4891 Unspecified atrial fibrillation: Secondary | ICD-10-CM | POA: Diagnosis not present

## 2016-09-17 DIAGNOSIS — I25118 Atherosclerotic heart disease of native coronary artery with other forms of angina pectoris: Secondary | ICD-10-CM | POA: Diagnosis not present

## 2016-09-17 DIAGNOSIS — I82402 Acute embolism and thrombosis of unspecified deep veins of left lower extremity: Secondary | ICD-10-CM | POA: Diagnosis not present

## 2016-09-17 DIAGNOSIS — J45909 Unspecified asthma, uncomplicated: Secondary | ICD-10-CM | POA: Diagnosis not present

## 2016-09-17 DIAGNOSIS — J849 Interstitial pulmonary disease, unspecified: Secondary | ICD-10-CM | POA: Diagnosis not present

## 2016-09-17 DIAGNOSIS — I10 Essential (primary) hypertension: Secondary | ICD-10-CM | POA: Diagnosis not present

## 2016-09-24 DIAGNOSIS — M810 Age-related osteoporosis without current pathological fracture: Secondary | ICD-10-CM | POA: Diagnosis not present

## 2016-11-22 DIAGNOSIS — Z950 Presence of cardiac pacemaker: Secondary | ICD-10-CM | POA: Diagnosis not present

## 2016-11-22 DIAGNOSIS — I4891 Unspecified atrial fibrillation: Secondary | ICD-10-CM | POA: Diagnosis not present

## 2016-11-24 DIAGNOSIS — R531 Weakness: Secondary | ICD-10-CM | POA: Diagnosis not present

## 2016-11-24 DIAGNOSIS — D500 Iron deficiency anemia secondary to blood loss (chronic): Secondary | ICD-10-CM | POA: Diagnosis not present

## 2016-11-26 DIAGNOSIS — R531 Weakness: Secondary | ICD-10-CM | POA: Diagnosis not present

## 2016-11-26 DIAGNOSIS — D500 Iron deficiency anemia secondary to blood loss (chronic): Secondary | ICD-10-CM | POA: Diagnosis not present

## 2016-11-26 LAB — CBC AND DIFFERENTIAL
HEMATOCRIT: 28 — AB (ref 36–46)
HEMOGLOBIN: 9 — AB (ref 12.0–16.0)
Platelets: 192 (ref 150–399)
WBC: 3.6

## 2016-11-26 LAB — HEPATIC FUNCTION PANEL
ALK PHOS: 56 (ref 25–125)
ALT: 22 (ref 7–35)
AST: 19 (ref 13–35)
BILIRUBIN, TOTAL: 0.5

## 2016-11-26 LAB — BASIC METABOLIC PANEL
BUN: 13 (ref 4–21)
Creatinine: 1 (ref 0.5–1.1)
Glucose: 93
POTASSIUM: 3.3 — AB (ref 3.4–5.3)
SODIUM: 140 (ref 137–147)

## 2016-11-29 DIAGNOSIS — I509 Heart failure, unspecified: Secondary | ICD-10-CM | POA: Diagnosis not present

## 2016-11-29 DIAGNOSIS — J84112 Idiopathic pulmonary fibrosis: Secondary | ICD-10-CM | POA: Diagnosis not present

## 2016-11-29 DIAGNOSIS — G2581 Restless legs syndrome: Secondary | ICD-10-CM | POA: Diagnosis not present

## 2016-11-29 DIAGNOSIS — D500 Iron deficiency anemia secondary to blood loss (chronic): Secondary | ICD-10-CM | POA: Diagnosis not present

## 2016-11-29 DIAGNOSIS — I10 Essential (primary) hypertension: Secondary | ICD-10-CM | POA: Diagnosis not present

## 2016-11-29 DIAGNOSIS — E038 Other specified hypothyroidism: Secondary | ICD-10-CM | POA: Diagnosis not present

## 2016-11-29 DIAGNOSIS — R0902 Hypoxemia: Secondary | ICD-10-CM | POA: Diagnosis not present

## 2016-12-03 LAB — CBC AND DIFFERENTIAL
HEMATOCRIT: 38 (ref 36–46)
HEMOGLOBIN: 12.3 (ref 12.0–16.0)

## 2016-12-20 ENCOUNTER — Encounter (HOSPITAL_COMMUNITY): Payer: Self-pay | Admitting: Emergency Medicine

## 2016-12-20 ENCOUNTER — Emergency Department (HOSPITAL_COMMUNITY): Payer: Medicare Other

## 2016-12-20 ENCOUNTER — Inpatient Hospital Stay (HOSPITAL_COMMUNITY)
Admission: EM | Admit: 2016-12-20 | Discharge: 2016-12-24 | DRG: 378 | Disposition: A | Discharge status: Home Health | Payer: Medicare Other | Attending: Internal Medicine | Admitting: Internal Medicine

## 2016-12-20 DIAGNOSIS — Z950 Presence of cardiac pacemaker: Secondary | ICD-10-CM

## 2016-12-20 DIAGNOSIS — I959 Hypotension, unspecified: Secondary | ICD-10-CM | POA: Diagnosis not present

## 2016-12-20 DIAGNOSIS — R55 Syncope and collapse: Secondary | ICD-10-CM | POA: Diagnosis not present

## 2016-12-20 DIAGNOSIS — I509 Heart failure, unspecified: Secondary | ICD-10-CM | POA: Diagnosis not present

## 2016-12-20 DIAGNOSIS — K3182 Dieulafoy lesion (hemorrhagic) of stomach and duodenum: Secondary | ICD-10-CM | POA: Diagnosis not present

## 2016-12-20 DIAGNOSIS — K921 Melena: Secondary | ICD-10-CM | POA: Diagnosis present

## 2016-12-20 DIAGNOSIS — I110 Hypertensive heart disease with heart failure: Secondary | ICD-10-CM | POA: Diagnosis present

## 2016-12-20 DIAGNOSIS — Z86711 Personal history of pulmonary embolism: Secondary | ICD-10-CM

## 2016-12-20 DIAGNOSIS — Z86718 Personal history of other venous thrombosis and embolism: Secondary | ICD-10-CM

## 2016-12-20 DIAGNOSIS — I4891 Unspecified atrial fibrillation: Secondary | ICD-10-CM | POA: Diagnosis present

## 2016-12-20 DIAGNOSIS — Z8711 Personal history of peptic ulcer disease: Secondary | ICD-10-CM

## 2016-12-20 DIAGNOSIS — E869 Volume depletion, unspecified: Secondary | ICD-10-CM | POA: Diagnosis present

## 2016-12-20 DIAGNOSIS — K279 Peptic ulcer, site unspecified, unspecified as acute or chronic, without hemorrhage or perforation: Secondary | ICD-10-CM | POA: Clinically undetermined

## 2016-12-20 DIAGNOSIS — J8410 Pulmonary fibrosis, unspecified: Secondary | ICD-10-CM | POA: Diagnosis present

## 2016-12-20 DIAGNOSIS — R0602 Shortness of breath: Secondary | ICD-10-CM | POA: Diagnosis not present

## 2016-12-20 DIAGNOSIS — K922 Gastrointestinal hemorrhage, unspecified: Secondary | ICD-10-CM | POA: Diagnosis not present

## 2016-12-20 DIAGNOSIS — E039 Hypothyroidism, unspecified: Secondary | ICD-10-CM | POA: Diagnosis present

## 2016-12-20 DIAGNOSIS — D62 Acute posthemorrhagic anemia: Secondary | ICD-10-CM | POA: Diagnosis present

## 2016-12-20 DIAGNOSIS — Z7901 Long term (current) use of anticoagulants: Secondary | ICD-10-CM

## 2016-12-20 DIAGNOSIS — Z79899 Other long term (current) drug therapy: Secondary | ICD-10-CM

## 2016-12-20 HISTORY — DX: Presence of cardiac pacemaker: Z95.0

## 2016-12-20 HISTORY — DX: Acute embolism and thrombosis of unspecified deep veins of unspecified lower extremity: I82.409

## 2016-12-20 HISTORY — DX: Heart failure, unspecified: I50.9

## 2016-12-20 HISTORY — DX: Gastrointestinal hemorrhage, unspecified: K92.2

## 2016-12-20 HISTORY — DX: Hypothyroidism, unspecified: E03.9

## 2016-12-20 HISTORY — DX: Unspecified atrial fibrillation: I48.91

## 2016-12-20 HISTORY — DX: Pulmonary fibrosis, unspecified: J84.10

## 2016-12-20 HISTORY — DX: Other pulmonary embolism without acute cor pulmonale: I26.99

## 2016-12-20 LAB — COMPREHENSIVE METABOLIC PANEL
ALK PHOS: 51 U/L (ref 38–126)
ALT: 19 U/L (ref 14–54)
AST: 18 U/L (ref 15–41)
Albumin: 3.3 g/dL — ABNORMAL LOW (ref 3.5–5.0)
Anion gap: 7 (ref 5–15)
BUN: 15 mg/dL (ref 6–20)
CALCIUM: 9 mg/dL (ref 8.9–10.3)
CO2: 27 mmol/L (ref 22–32)
CREATININE: 0.85 mg/dL (ref 0.44–1.00)
Chloride: 104 mmol/L (ref 101–111)
GFR calc Af Amer: >60 mL/min (ref >60)
Glucose, Bld: 145 mg/dL — ABNORMAL HIGH (ref 65–99)
Potassium: 3.7 mmol/L (ref 3.5–5.1)
Sodium: 138 mmol/L (ref 135–145)
Total Bilirubin: 0.5 mg/dL (ref 0.3–1.2)
Total Protein: 6.1 g/dL — ABNORMAL LOW (ref 6.5–8.1)

## 2016-12-20 LAB — CBC
HEMATOCRIT: 27 % — AB (ref 36.0–46.0)
HEMOGLOBIN: 8.8 g/dL — AB (ref 12.0–15.0)
MCH: 35.6 pg — AB (ref 26.0–34.0)
MCHC: 32.6 g/dL (ref 30.0–36.0)
MCV: 109.3 fL — AB (ref 78.0–100.0)
Platelets: 240 10*3/uL (ref 150–400)
RBC: 2.47 MIL/uL — AB (ref 3.87–5.11)
RDW: 18.3 % — ABNORMAL HIGH (ref 11.5–15.5)
WBC: 3.9 10*3/uL — ABNORMAL LOW (ref 4.0–10.5)

## 2016-12-20 LAB — ABO/RH: ABO/RH(D): B POS

## 2016-12-20 MED ORDER — PANTOPRAZOLE SODIUM 40 MG IV SOLR
40.0000 mg | Freq: Once | INTRAVENOUS | Status: AC
  Administered 2016-12-21: 40 mg via INTRAVENOUS
  Filled 2016-12-20: qty 40

## 2016-12-20 NOTE — ED Triage Notes (Signed)
Pt has lived with son for the past 2 weeks.  2 nights ago she fell during the middle of the night.  Yesterday she had 2 syncopal episodes where she fell and was unresponsive for approx 15 seconds.  Had 1 episode of same today.  Pt reports dizziness when standing up and then she passes out.  BP sitting 118/85 and standing 87/45.  History of GI bleed 1 month ago and received blood transfusion.  Reports stool is dark at this time.  Denies injury from fall except for R knee and abrasion to toe.  Pt wears 2.5 liters of oxygen all the time.  Pt appears sob when talking.  States she feels more short of breath than usual.  Denies weight gain.

## 2016-12-20 NOTE — ED Notes (Signed)
Pt going to x-ray and then to A4.  X-ray notified not to stand pt due to dizziness and orthostatic hypotension.

## 2016-12-20 NOTE — ED Provider Notes (Signed)
[facility] DEPT Provider Note   CSN: [id number] Arrival date & time: 12/20/16  2013     History   Chief Complaint Chief Complaint  Patient presents with  . Loss of Consciousness  . Hypotension    HPI Kimberly Horn is a 83 y.o. female.  The history is provided by the patient and a relative.  Loss of Consciousness   This is a recurrent problem. The problem occurs daily. The problem has not changed since onset.Length of episode of loss of consciousness: unknown. The problem is associated with standing up. Associated symptoms include light-headedness and malaise/fatigue. Pertinent negatives include bladder incontinence, bowel incontinence, chest pain and dizziness. Associated symptoms comments: Melena. Past medical history comments: h/o A fib on Eliquis, and GI bleed requiring transfusions.    Past Medical History:  Diagnosis Date  . Atrial fibrillation ([place])   . CHF (congestive heart failure) ([place])   . DVT (deep venous thrombosis) ([place])   . GI bleed   . Hypothyroidism   . Pacemaker   . Pulmonary embolism ([place])   . Pulmonary fibrosis ([place])     There are no active problems to display for this patient.   Past Surgical History:  Procedure Laterality Date  . ABLATION    . PACEMAKER INSERTION      OB History    No data available       Home Medications    Prior to Admission medications   Medication Sig Start Date End Date Taking? Authorizing Provider  apixaban (ELIQUIS) 5 MG TABS tablet Take 5 mg by mouth 2 (two) times daily.   Yes Historical Provider, MD  DULoxetine (CYMBALTA) 30 MG capsule Take 30 mg by mouth 2 (two) times daily.   Yes Historical Provider, MD  furosemide (LASIX) 20 MG tablet Take 20 mg by mouth daily.   Yes Historical Provider, MD  levothyroxine (SYNTHROID, LEVOTHROID) 100 MCG tablet Take 100 mcg by mouth every evening.   Yes Historical Provider, MD  Multiple Vitamin (MULTIVITAMIN WITH MINERALS) TABS tablet Take 1 tablet by mouth daily.   Yes  Historical Provider, MD  omeprazole (PRILOSEC) 40 MG capsule Take 40 mg by mouth 2 (two) times daily.   Yes Historical Provider, MD  potassium chloride SA (K-DUR,KLOR-CON) 20 MEQ tablet Take 20 mEq by mouth daily.   Yes Historical Provider, MD  sucralfate (CARAFATE) 1 GM/10ML suspension Take 1 g by mouth 2 (two) times daily.   Yes Historical Provider, MD    Family History No family history on file.  Social History Social History  Substance Use Topics  . Smoking status: Never Smoker  . Smokeless tobacco: Never Used  . Alcohol use No     Allergies   Demerol [meperidine]; Hydroxyzine; and Talwin [pentazocine]   Review of Systems Review of Systems  Constitutional: Positive for malaise/fatigue.  Cardiovascular: Positive for syncope. Negative for chest pain.  Gastrointestinal: Negative for bowel incontinence.  Genitourinary: Negative for bladder incontinence.  Neurological: Positive for light-headedness. Negative for dizziness.     Physical Exam Updated Vital Signs BP 140/84   Pulse 84   Temp 98.4 F (36.9 C) (Oral)   Resp 16   SpO2 100%   Vitals:   12/20/16 2042 12/20/16 2052 12/20/16 2230  BP: 118/85 (!) 87/45 140/84  Pulse: 82 80 84  Resp: 20  16  Temp: 98.4 F (36.9 C)    TempSrc: Oral    SpO2: 100%  100%    Physical Exam  Constitutional: She is oriented to person,  place, and time. She appears well-developed and well-nourished. No distress.  HENT:  Head: Normocephalic and atraumatic.  Nose: Nose normal.  Eyes: EOM are normal. Pupils are equal, round, and reactive to light. Right eye exhibits no discharge. Left eye exhibits no discharge. No scleral icterus.  Pale conjunctiva  Neck: Normal range of motion. Neck supple.  Cardiovascular: Normal rate and regular rhythm.  Exam reveals no gallop and no friction rub.   No murmur heard. Pulmonary/Chest: Effort normal and breath sounds normal. No stridor. No respiratory distress. She has no rales.  Abdominal: Soft.  She exhibits no distension. There is no tenderness.  Musculoskeletal: She exhibits no edema or tenderness.  Neurological: She is alert and oriented to person, place, and time.  Skin: Skin is warm and dry. No rash noted. She is not diaphoretic. No erythema.  Psychiatric: She has a normal mood and affect.  Vitals reviewed.    ED Treatments / Results  Labs (all labs ordered are listed, but only abnormal results are displayed) Labs Reviewed  CBC - Abnormal; Notable for the following:       Result Value   WBC 3.9 (*)    RBC 2.47 (*)    Hemoglobin 8.8 (*)    HCT 27.0 (*)    MCV 109.3 (*)    MCH 35.6 (*)    RDW 18.3 (*)    All other components within normal limits  COMPREHENSIVE METABOLIC PANEL - Abnormal; Notable for the following:    Glucose, Bld 145 (*)    Total Protein 6.1 (*)    Albumin 3.3 (*)    All other components within normal limits  POC OCCULT BLOOD, ED - Abnormal; Notable for the following:    Fecal Occult Bld POSITIVE (*)    All other components within normal limits  URINALYSIS, ROUTINE W REFLEX MICROSCOPIC  TYPE AND SCREEN  ABO/RH    EKG  EKG Interpretation  Date/Time:  Friday December 20 2016 20:39:24 EST Ventricular Rate:  89 PR Interval:    QRS Duration: 132 QT Interval:  438 QTC Calculation: 532 R Axis:   78 Text Interpretation:  Ventricular-paced rhythm Abnormal ECG No old tracing to compare Confirmed by [facility] MD, PEDRO [phone]) on 12/20/2016 11:04:47 PM       Radiology Dg Chest 2 View  Result Date: 12/20/2016 CLINICAL DATA:  Shortness of breath since a fall yesterday morning. EXAM: CHEST  2 VIEW COMPARISON:  None. FINDINGS: Cardiac pacemaker. Normal heart size and pulmonary vascularity. No focal airspace disease or consolidation in the lungs. Mild bronchiectasis and bronchial wall thickening suggesting bronchitis. This may be acute or chronic. No blunting of costophrenic angles. No pneumothorax. Mediastinal contours appear intact. Calcification of  the aorta. Degenerative changes in the shoulders and in the spine. Anterior compression of T12 with about 50% loss of height. Age is indeterminate. IMPRESSION: No focal consolidation or airspace disease. Bronchitic changes in the lungs may be acute or chronic. Anterior compression of T12 is age-indeterminate. Electronically Signed   By: Lucienne Capers M.D.   On: 12/20/2016 21:42    Procedures Procedures (including critical care time)  Medications Ordered in ED Medications  pantoprazole (PROTONIX) injection 40 mg (not administered)     Initial Impression / Assessment and Plan / ED Course  I have reviewed the triage vital signs and the nursing notes.  Pertinent labs & imaging results that were available during my care of the patient were reviewed by me and considered in my medical decision making (see  chart for details).     Given patient's history, presentation concerning for GI bleed. Given the patient's fall 2 nights ago with a history of anticoagulation CT head was obtained which did not show any intracranial hemorrhage. Labs revealed hemoglobin of 8.8, thus not requiring transfusion at this time. Patient blood pressure is stable when she is was lying down. Will admit to Hospitalist for continued work up and management  Final Clinical Impressions(s) / ED Diagnoses   Final diagnoses:  Syncope and collapse  Gastrointestinal hemorrhage with melena      Fatima Blank, MD 12/21/16 [phone]

## 2016-12-20 NOTE — ED Notes (Signed)
Pt is in X-Ray. 

## 2016-12-20 NOTE — ED Notes (Signed)
Pt reports 4 syncopal episodes over the past 2 days. Today her blood pressure dropped and that concerned the family.

## 2016-12-21 DIAGNOSIS — I110 Hypertensive heart disease with heart failure: Secondary | ICD-10-CM | POA: Diagnosis present

## 2016-12-21 DIAGNOSIS — J8410 Pulmonary fibrosis, unspecified: Secondary | ICD-10-CM | POA: Diagnosis present

## 2016-12-21 DIAGNOSIS — E039 Hypothyroidism, unspecified: Secondary | ICD-10-CM | POA: Diagnosis present

## 2016-12-21 DIAGNOSIS — I959 Hypotension, unspecified: Secondary | ICD-10-CM | POA: Diagnosis present

## 2016-12-21 DIAGNOSIS — R55 Syncope and collapse: Secondary | ICD-10-CM | POA: Diagnosis present

## 2016-12-21 DIAGNOSIS — I4891 Unspecified atrial fibrillation: Secondary | ICD-10-CM | POA: Diagnosis not present

## 2016-12-21 DIAGNOSIS — E869 Volume depletion, unspecified: Secondary | ICD-10-CM | POA: Diagnosis present

## 2016-12-21 DIAGNOSIS — D62 Acute posthemorrhagic anemia: Secondary | ICD-10-CM | POA: Diagnosis not present

## 2016-12-21 DIAGNOSIS — K279 Peptic ulcer, site unspecified, unspecified as acute or chronic, without hemorrhage or perforation: Secondary | ICD-10-CM | POA: Diagnosis not present

## 2016-12-21 DIAGNOSIS — Z86718 Personal history of other venous thrombosis and embolism: Secondary | ICD-10-CM | POA: Diagnosis not present

## 2016-12-21 DIAGNOSIS — Z79899 Other long term (current) drug therapy: Secondary | ICD-10-CM | POA: Diagnosis not present

## 2016-12-21 DIAGNOSIS — K922 Gastrointestinal hemorrhage, unspecified: Secondary | ICD-10-CM | POA: Diagnosis not present

## 2016-12-21 DIAGNOSIS — Z8711 Personal history of peptic ulcer disease: Secondary | ICD-10-CM | POA: Diagnosis not present

## 2016-12-21 DIAGNOSIS — K226 Gastro-esophageal laceration-hemorrhage syndrome: Secondary | ICD-10-CM | POA: Diagnosis not present

## 2016-12-21 DIAGNOSIS — K3189 Other diseases of stomach and duodenum: Secondary | ICD-10-CM | POA: Diagnosis not present

## 2016-12-21 DIAGNOSIS — Z7901 Long term (current) use of anticoagulants: Secondary | ICD-10-CM | POA: Diagnosis not present

## 2016-12-21 DIAGNOSIS — Z950 Presence of cardiac pacemaker: Secondary | ICD-10-CM | POA: Diagnosis not present

## 2016-12-21 DIAGNOSIS — I482 Chronic atrial fibrillation: Secondary | ICD-10-CM | POA: Diagnosis not present

## 2016-12-21 DIAGNOSIS — K921 Melena: Secondary | ICD-10-CM | POA: Diagnosis not present

## 2016-12-21 DIAGNOSIS — I509 Heart failure, unspecified: Secondary | ICD-10-CM | POA: Diagnosis present

## 2016-12-21 DIAGNOSIS — Z86711 Personal history of pulmonary embolism: Secondary | ICD-10-CM | POA: Diagnosis not present

## 2016-12-21 DIAGNOSIS — K3182 Dieulafoy lesion (hemorrhagic) of stomach and duodenum: Secondary | ICD-10-CM | POA: Diagnosis present

## 2016-12-21 DIAGNOSIS — K558 Other vascular disorders of intestine: Secondary | ICD-10-CM | POA: Diagnosis not present

## 2016-12-21 LAB — HEMOGLOBIN AND HEMATOCRIT, BLOOD
HCT: 26 % — ABNORMAL LOW (ref 36.0–46.0)
HEMATOCRIT: 25.5 % — AB (ref 36.0–46.0)
HEMOGLOBIN: 8.4 g/dL — AB (ref 12.0–15.0)
HEMOGLOBIN: 8.5 g/dL — AB (ref 12.0–15.0)

## 2016-12-21 LAB — IRON AND TIBC
Iron: 209 ug/dL — ABNORMAL HIGH (ref 28–170)
SATURATION RATIOS: 79 % — AB (ref 10.4–31.8)
TIBC: 263 ug/dL (ref 250–450)
UIBC: 54 ug/dL

## 2016-12-21 LAB — TROPONIN I

## 2016-12-21 LAB — POC OCCULT BLOOD, ED: Fecal Occult Bld: POSITIVE — AB

## 2016-12-21 LAB — RETICULOCYTES
RBC.: 2.44 MIL/uL — ABNORMAL LOW (ref 3.87–5.11)
Retic Count, Absolute: 68.3 10*3/uL (ref 19.0–186.0)
Retic Ct Pct: 2.8 % (ref 0.4–3.1)

## 2016-12-21 LAB — TSH: TSH: 2.207 u[IU]/mL (ref 0.350–4.500)

## 2016-12-21 LAB — FOLATE: FOLATE: 28.5 ng/mL (ref >5.9)

## 2016-12-21 LAB — FERRITIN: Ferritin: 220 ng/mL (ref 11–307)

## 2016-12-21 LAB — PREPARE RBC (CROSSMATCH)

## 2016-12-21 LAB — VITAMIN B12: Vitamin B-12: 306 pg/mL (ref 180–914)

## 2016-12-21 MED ORDER — DULOXETINE HCL 30 MG PO CPEP
30.0000 mg | ORAL_CAPSULE | Freq: Two times a day (BID) | ORAL | Status: DC
  Administered 2016-12-21 – 2016-12-24 (×7): 30 mg via ORAL
  Filled 2016-12-21 (×7): qty 1

## 2016-12-21 MED ORDER — ACETAMINOPHEN 650 MG RE SUPP: 650.0000 mg | Freq: Four times a day (QID) | RECTAL | Status: DC | PRN

## 2016-12-21 MED ORDER — ONDANSETRON HCL 4 MG/2ML IJ SOLN: 4.0000 mg | Freq: Four times a day (QID) | INTRAMUSCULAR | Status: DC | PRN

## 2016-12-21 MED ORDER — ACETAMINOPHEN 325 MG PO TABS
650.0000 mg | ORAL_TABLET | Freq: Four times a day (QID) | ORAL | Status: DC | PRN
  Administered 2016-12-21 (×2): 650 mg via ORAL
  Filled 2016-12-21 (×2): qty 2

## 2016-12-21 MED ORDER — DIPHENHYDRAMINE HCL 25 MG PO CAPS
25.0000 mg | ORAL_CAPSULE | Freq: Four times a day (QID) | ORAL | Status: DC | PRN
  Administered 2016-12-21: 25 mg via ORAL
  Filled 2016-12-21: qty 1

## 2016-12-21 MED ORDER — SODIUM CHLORIDE 0.9% FLUSH
3.0000 mL | Freq: Two times a day (BID) | INTRAVENOUS | Status: DC
  Administered 2016-12-21 – 2016-12-24 (×7): 3 mL via INTRAVENOUS

## 2016-12-21 MED ORDER — SUCRALFATE 1 GM/10ML PO SUSP
1.0000 g | Freq: Two times a day (BID) | ORAL | Status: DC
  Administered 2016-12-21 – 2016-12-24 (×6): 1 g via ORAL
  Filled 2016-12-21 (×6): qty 10

## 2016-12-21 MED ORDER — LEVOTHYROXINE SODIUM 100 MCG PO TABS
100.0000 ug | ORAL_TABLET | Freq: Every evening | ORAL | Status: DC
  Administered 2016-12-21 – 2016-12-23 (×2): 100 ug via ORAL
  Filled 2016-12-21 (×3): qty 1

## 2016-12-21 MED ORDER — PANTOPRAZOLE SODIUM 40 MG PO TBEC: 40.0000 mg | DELAYED_RELEASE_TABLET | Freq: Two times a day (BID) | ORAL | Status: DC

## 2016-12-21 MED ORDER — FUROSEMIDE 20 MG PO TABS: 20.0000 mg | ORAL_TABLET | Freq: Every day | ORAL | Status: DC

## 2016-12-21 MED ORDER — POTASSIUM CHLORIDE CRYS ER 20 MEQ PO TBCR
20.0000 meq | EXTENDED_RELEASE_TABLET | Freq: Every day | ORAL | Status: DC
  Administered 2016-12-21 – 2016-12-24 (×4): 20 meq via ORAL
  Filled 2016-12-21 (×4): qty 1

## 2016-12-21 MED ORDER — ADULT MULTIVITAMIN W/MINERALS CH
1.0000 | ORAL_TABLET | Freq: Every day | ORAL | Status: DC
  Administered 2016-12-21 – 2016-12-24 (×4): 1 via ORAL
  Filled 2016-12-21 (×4): qty 1

## 2016-12-21 MED ORDER — SODIUM CHLORIDE 0.9 % IV SOLN
INTRAVENOUS | Status: DC
  Administered 2016-12-21: 1000 mL via INTRAVENOUS

## 2016-12-21 MED ORDER — PANTOPRAZOLE SODIUM 40 MG IV SOLR
40.0000 mg | Freq: Two times a day (BID) | INTRAVENOUS | Status: DC
  Administered 2016-12-21 – 2016-12-22 (×4): 40 mg via INTRAVENOUS
  Filled 2016-12-21 (×4): qty 40

## 2016-12-21 MED ORDER — ONDANSETRON HCL 4 MG PO TABS: 4.0000 mg | ORAL_TABLET | Freq: Four times a day (QID) | ORAL | Status: DC | PRN

## 2016-12-21 MED ORDER — ALBUTEROL SULFATE (2.5 MG/3ML) 0.083% IN NEBU: 2.5000 mg | INHALATION_SOLUTION | RESPIRATORY_TRACT | Status: DC | PRN

## 2016-12-21 NOTE — Progress Notes (Signed)
Report received from [place], [place]. Awaiting patient arrival.

## 2016-12-21 NOTE — Progress Notes (Signed)
PROGRESS NOTE        PATIENT DETAILS Name: Kimberly Horn Age: 83 y.o. Sex: female Date of Birth: [birth year] Admit Date: 12/20/2016 Admitting Physician Norval Morton, MD PCP:Pcp Not In System  Brief Narrative: Patient is a 83 y.o. female with history of atrial fibrillation (status post ablation), permanent pacemaker placement, history of venous thromboembolism on chronic anticoagulation with Eliquis, prior history of PRBC transfusion for anemia (last-3 weeks back)-admitted for evaluation of 3-4 episodes of syncope along with melanotic stools.  Subjective: No melena since admission.  Assessment/Plan: Syncope: Likely secondary to volume depletion/GI bleeding/orthostatic mechanism-her initial blood pressure in the ED was 87/45. EKG/telemetry shows a paced rhythm. Have asked RN to see if we can interrogate the pacemaker as well.  Upper GI bleed with acute blood loss anemia: Continue with IV PPI twice a day, being transfused 1 unit of PRBC. GI has been consulted for endoscopic evaluation. Plans are to follow CBC periodically, and await further endoscopic evaluation. Eliquis remains on hold  Transient hypotension: Suspect secondary to above, resolved with supportive measures.  History of prior venous thromboembolism: Per Daughter. (is a M.D.) patient had a DVT last year (June 2016) in a setting of prolonged/frequent hospitalization, and a pulmonary embolism approximately 15-20 years back in a setting of hysterectomy. Patient has already completed more than 6 months of anticoagulation for what sounds like a provoked venous thromboembolism. Plans are to await endoscopic evaluation-continue to hold anticoagulation-and can consider IVC filter at some point if indicated after reviewing records from [facility].  History of atrial fibrillation/permanent pacemaker placement: Per family-patient has undergone ablation in the past-and required pacemaker placement sometime in  2016.  Essential hypertension: Continue to hold Lasix-blood pressure currently stable.  Hypothyroidism: Continue Synthroid  ? Prior history of peptic ulcer disease: Poor historian-per my understanding after talking with the patient's daughter-patient had a negative endoscopy evaluation in the past-but for now would continue with PPI and Carafate.   Other issues: Await PPM interrogation Await arrival of records from [facility] (could not locate patient in care everywhere)  DVT Prophylaxis: Prophylactic Lovenox or Heparin or SCD's Full dose anticoagulation with Heparin/Lovenox/Coumadin/Eliquis/Xarelto/Pradaxa  Code Status: Full code or DNR  Family Communication: Daughter-in-law at bedside  Daughter over the phone   Disposition Plan: Remain inpatient-requires a few more days of inpatient hospitalization before discharge. Await PT eval to determine appropriate disposition.  Antimicrobial agents: Anti-infectives    None     Procedures: None  CONSULTS:  GI  Time spent: 25- minutes-Greater than 50% of this time was spent in counseling, explanation of diagnosis, planning of further management, and coordination of care.  MEDICATIONS: Scheduled Meds: . DULoxetine  30 mg Oral BID  . levothyroxine  100 mcg Oral QPM  . multivitamin with minerals  1 tablet Oral Daily  . pantoprazole (PROTONIX) IV  40 mg Intravenous Q12H  . potassium chloride SA  20 mEq Oral Daily  . sodium chloride flush  3 mL Intravenous Q12H  . sucralfate  1 g Oral BID WC   Continuous Infusions: . sodium chloride 1,000 mL (12/21/16 0415)   PRN Meds:.acetaminophen **OR** acetaminophen, albuterol, diphenhydrAMINE, ondansetron **OR** ondansetron (ZOFRAN) IV   PHYSICAL EXAM: Vital signs: Vitals:   12/21/16 0233 12/21/16 0848 12/21/16 0912 12/21/16 1245  BP:  (!) 124/55 (!) 127/54 (!) 118/51  Pulse: 74 73 75 73  Resp:  18 18 18   Temp:  98.2 F (36.8 C) 98.1 F (36.7 C) 98.4 F (36.9 C)   TempSrc:  Oral Oral Oral  SpO2: 100% 100% 100% 99%  Weight: 62.8 kg (138 lb 8 oz)     Height: 5\' 4"  (1.626 m)      Filed Weights   12/21/16 0233  Weight: 62.8 kg (138 lb 8 oz)   Body mass index is 23.77 kg/m.   General appearance :Awake, alert, not in any distress. Speech Clear. Hard of hearing Eyes:, pupils equally reactive to light and accomodation,no scleral icterus.Pink conjunctiva HEENT: Atraumatic and Normocephalic Neck: supple, no JVD. No cervical lymphadenopathy. No thyromegaly Resp:Good air entry bilaterally, no added sounds  CVS: S1 S2 regular GI: Bowel sounds present, Non tender and not distended with no gaurding, rigidity or rebound.No organomegaly Extremities: B/L Lower Ext shows no edema, both legs are warm to touch Neurology:  speech clear,Non focal, sensation is grossly intact. Psychiatric: Normal judgment and insight. Alert and oriented x 3. Normal mood. Musculoskeletal:No digital cyanosis Skin:No Rash, warm and dry Wounds:N/A  I have personally reviewed following labs and imaging studies  LABORATORY DATA: CBC:  Recent Labs Lab 12/20/16 2111 12/21/16 0456  WBC 3.9*  --   HGB 8.8* 8.4*  HCT 27.0* 25.5*  MCV 109.3*  --   PLT 240  --     Basic Metabolic Panel:  Recent Labs Lab 12/20/16 2112  NA 138  K 3.7  CL 104  CO2 27  GLUCOSE 145*  BUN 15  CREATININE 0.85  CALCIUM 9.0    GFR: Estimated Creatinine Clearance: 42.5 mL/min (by C-G formula based on SCr of 0.85 mg/dL).  Liver Function Tests:  Recent Labs Lab 12/20/16 2112  AST 18  ALT 19  ALKPHOS 51  BILITOT 0.5  PROT 6.1*  ALBUMIN 3.3*   No results for input(s): LIPASE, AMYLASE in the last 168 hours. No results for input(s): AMMONIA in the last 168 hours.  Coagulation Profile: No results for input(s): INR, PROTIME in the last 168 hours.  Cardiac Enzymes:  Recent Labs Lab 12/21/16 0456  TROPONINI <0.03    BNP (last 3 results) No results for input(s): PROBNP in the  last 8760 hours.  HbA1C: No results for input(s): HGBA1C in the last 72 hours.  CBG: No results for input(s): GLUCAP in the last 168 hours.  Lipid Profile: No results for input(s): CHOL, HDL, LDLCALC, TRIG, CHOLHDL, LDLDIRECT in the last 72 hours.  Thyroid Function Tests:  Recent Labs  12/21/16 0456  TSH 2.207    Anemia Panel: No results for input(s): VITAMINB12, FOLATE, FERRITIN, TIBC, IRON, RETICCTPCT in the last 72 hours.  Urine analysis: No results found for: COLORURINE, APPEARANCEUR, LABSPEC, PHURINE, GLUCOSEU, HGBUR, BILIRUBINUR, KETONESUR, PROTEINUR, UROBILINOGEN, NITRITE, LEUKOCYTESUR  Sepsis Labs: Lactic Acid, Venous No results found for: LATICACIDVEN  MICROBIOLOGY: No results found for this or any previous visit (from the past 240 hour(s)).  RADIOLOGY STUDIES/RESULTS: Dg Chest 2 View  Result Date: 12/20/2016 CLINICAL DATA:  Shortness of breath since a fall yesterday morning. EXAM: CHEST  2 VIEW COMPARISON:  None. FINDINGS: Cardiac pacemaker. Normal heart size and pulmonary vascularity. No focal airspace disease or consolidation in the lungs. Mild bronchiectasis and bronchial wall thickening suggesting bronchitis. This may be acute or chronic. No blunting of costophrenic angles. No pneumothorax. Mediastinal contours appear intact. Calcification of the aorta. Degenerative changes in the shoulders and in the spine. Anterior compression of T12 with about 50% loss of height. Age  is indeterminate. IMPRESSION: No focal consolidation or airspace disease. Bronchitic changes in the lungs may be acute or chronic. Anterior compression of T12 is age-indeterminate. Electronically Signed   By: Lucienne Capers M.D.   On: 12/20/2016 21:42     LOS: 0 days   Oren Binet, MD  [facility] Pager:[phone]  If 7PM-7AM, please contact night-coverage [email or web address] Password [email or web address] 12/21/2016, 1:33 PM

## 2016-12-21 NOTE — Progress Notes (Signed)
Called for report.RN Will call back.

## 2016-12-21 NOTE — H&P (Addendum)
History and Physical    Kimberly Horn [id number] DOB: May 06, 1930 DOA: 12/20/2016  Referring MD/NP/PA: Dr. Leonette Monarch PCP: [place] Not In System  Patient coming from: Home  Chief Complaint: Syncope  HPI: Kimberly Horn is a 83 y.o. female with medical history significant of atrial fibrillation on Eliquis, s/p PM, DVT/PE, hypothyroidism, PUD/GI bleed, and anemia requiring previous transfusions; who presents after having multiple syncopal episodes.  Patient is from out of town and her daughter-in-law helped provide history. Over the last 3 days the patient has reportedly had at least 4 syncopal episodes. Once that occurred 2 days ago for witnessed and the patient's  daughter-in-law was present to guide the patient down so that she did not hit her head. The syncopal episode that occurred 3 days ago was unwitnessed while the patient had gotten up to use the restroom at night. Associated symptoms of lightheadedness( worse with changes in position),  generalized fatigue, dark stools, and weakness. Family members noted that her stools were dark , but felt that they did not appear black or tarry or have visible blood. The patient reportedly thought that she had blood in her stools prior. She notes previous history of peptic ulcer disease for which she is required transfusions in the past. Her last transfusion was approximately 3 weeks ago.  ED Course: She with blood pressures as low as 87/45 on admission. Guaiac stool resulted positive for blood. Patient was given 1 dose of 40 mg of Protonix IV. Chest x-ray showing no acute infiltrate. TRH called to admit for syncope and symptomatic anemia. Review of Systems: As per HPI otherwise 10 point review of systems negative.   Past Medical History:  Diagnosis Date  . Atrial fibrillation ([place])   . CHF (congestive heart failure) ([place])   . DVT (deep venous thrombosis) ([place])   . GI bleed   . Hypothyroidism   . Pacemaker   . Pulmonary embolism ([place])   . Pulmonary fibrosis ([place])      Past Surgical History:  Procedure Laterality Date  . ABLATION    . PACEMAKER INSERTION       reports that she has never smoked. She has never used smokeless tobacco. She reports that she does not drink alcohol or use drugs.  Allergies  Allergen Reactions  . Demerol [Meperidine] Other (See Comments)    INTENSIFIED FEELINGS  . Hydroxyzine Other (See Comments)    INTENSIFIED FEELINGS  . Talwin [Pentazocine] Other (See Comments)    INTENSIFIED FEELINGS    No family history on file.  Prior to Admission medications   Medication Sig Start Date End Date Taking? Authorizing Provider  apixaban (ELIQUIS) 5 MG TABS tablet Take 5 mg by mouth 2 (two) times daily.   Yes Historical Provider, MD  DULoxetine (CYMBALTA) 30 MG capsule Take 30 mg by mouth 2 (two) times daily.   Yes Historical Provider, MD  furosemide (LASIX) 20 MG tablet Take 20 mg by mouth daily.   Yes Historical Provider, MD  levothyroxine (SYNTHROID, LEVOTHROID) 100 MCG tablet Take 100 mcg by mouth every evening.   Yes Historical Provider, MD  Multiple Vitamin (MULTIVITAMIN WITH MINERALS) TABS tablet Take 1 tablet by mouth daily.   Yes Historical Provider, MD  omeprazole (PRILOSEC) 40 MG capsule Take 40 mg by mouth 2 (two) times daily.   Yes Historical Provider, MD  potassium chloride SA (K-DUR,KLOR-CON) 20 MEQ tablet Take 20 mEq by mouth daily.   Yes Historical Provider, MD  sucralfate (CARAFATE) 1 GM/10ML suspension Take 1 g by  mouth 2 (two) times daily.   Yes Historical Provider, MD    Physical Exam:  Constitutional: Elderly female who appears significantly fatigued. Vitals:   12/20/16 2245 12/20/16 2300 12/20/16 2315 12/20/16 2330  BP: 135/60 139/62 136/66 134/58  Pulse:    72  Resp: 19 15 21 12   Temp:      TempSrc:      SpO2:    100%   Eyes: PERRL, lids and conjunctivae normal ENMT: Mucous membranes are dry. Posterior pharynx clear of any exudate or lesions. Neck: normal, supple, no masses, no  thyromegaly Respiratory: clear to auscultation bilaterally, no wheezing, no crackles. Normal respiratory effort. No accessory muscle use.  Cardiovascular: Regular rate and rhythm, no murmurs / rubs / gallops. No extremity edema. 2+ pedal pulses. No carotid bruits.  pacemaker present on the left upper chest wall.  Abdomen: no tenderness, no masses palpated. No hepatosplenomegaly. Bowel sounds positive.  Musculoskeletal: no clubbing / cyanosis. No joint deformity upper and lower extremities. Good ROM, no contractures. Normal muscle tone.  Skin: Pallor,  no rashes, lesions, ulcers. No induration Neurologic: CN 2-12 grossly intact. Sensation intact, DTR normal. Strength 5/5 in all 4.  Psychiatric: Normal judgment and insight. Alert and oriented x 3. Normal mood.     Labs on Admission: I have personally reviewed following labs and imaging studies  CBC:  Recent Labs Lab 12/20/16 2111  WBC 3.9*  HGB 8.8*  HCT 27.0*  MCV 109.3*  PLT [id number]   Basic Metabolic Panel:  Recent Labs Lab 12/20/16 2112  NA 138  K 3.7  CL 104  CO2 27  GLUCOSE 145*  BUN 15  CREATININE 0.85  CALCIUM 9.0   GFR: CrCl cannot be calculated (Unknown ideal weight.). Liver Function Tests:  Recent Labs Lab 12/20/16 2112  AST 18  ALT 19  ALKPHOS 51  BILITOT 0.5  PROT 6.1*  ALBUMIN 3.3*   No results for input(s): LIPASE, AMYLASE in the last 168 hours. No results for input(s): AMMONIA in the last 168 hours. Coagulation Profile: No results for input(s): INR, PROTIME in the last 168 hours. Cardiac Enzymes: No results for input(s): CKTOTAL, CKMB, CKMBINDEX, TROPONINI in the last 168 hours. BNP (last 3 results) No results for input(s): PROBNP in the last 8760 hours. HbA1C: No results for input(s): HGBA1C in the last 72 hours. CBG: No results for input(s): GLUCAP in the last 168 hours. Lipid Profile: No results for input(s): CHOL, HDL, LDLCALC, TRIG, CHOLHDL, LDLDIRECT in the last 72 hours. Thyroid  Function Tests: No results for input(s): TSH, T4TOTAL, FREET4, T3FREE, THYROIDAB in the last 72 hours. Anemia Panel: No results for input(s): VITAMINB12, FOLATE, FERRITIN, TIBC, IRON, RETICCTPCT in the last 72 hours. Urine analysis: No results found for: COLORURINE, APPEARANCEUR, LABSPEC, PHURINE, GLUCOSEU, HGBUR, BILIRUBINUR, KETONESUR, PROTEINUR, UROBILINOGEN, NITRITE, LEUKOCYTESUR Sepsis Labs: No results found for this or any previous visit (from the past 240 hour(s)).   Radiological Exams on Admission: Dg Chest 2 View  Result Date: 12/20/2016 CLINICAL DATA:  Shortness of breath since a fall yesterday morning. EXAM: CHEST  2 VIEW COMPARISON:  None. FINDINGS: Cardiac pacemaker. Normal heart size and pulmonary vascularity. No focal airspace disease or consolidation in the lungs. Mild bronchiectasis and bronchial wall thickening suggesting bronchitis. This may be acute or chronic. No blunting of costophrenic angles. No pneumothorax. Mediastinal contours appear intact. Calcification of the aorta. Degenerative changes in the shoulders and in the spine. Anterior compression of T12 with about 50% loss of height. Age is  indeterminate. IMPRESSION: No focal consolidation or airspace disease. Bronchitic changes in the lungs may be acute or chronic. Anterior compression of T12 is age-indeterminate. Electronically Signed   By: Lucienne Capers M.D.   On: 12/20/2016 21:42    EKG: Independently reviewed. ventricularly paced rhythm  Assessment/Plan Syncope: Acute. Patient presents after having multiple episodes of syncope. Suspect - Admit to a telemetry bed - Check trend troponins - Follow-up telemetry overnight   GI bleed/acute blood loss anemia: Stool guaiac positive. Patient's initial hemoglobin 8.8. Unclear what patient's baseline hemoglobin is as she is from out of town. - H&H every 6 hours x 3 - T&S for possible need of blood products - Transfuse 1 unit PRBC  - Gentle IVF - May want to try to  locate outside medical records - Will need to consult GI in a.m.  Hypotension: Blood pressure noted to be as low as 87/43 on admission. - Therapies as seen above continue to monitor  Atrial fibrillation s/p PM on Eliquis / Hx of DVT/PE - hold eliquis  Hypothyroidism  - Check TSH - continue Synthroid   Essential hypertension - held Lasix, restart when medically appropriate  History of peptic ulcer disease - Continue Carafate and pharmacy substitution of Protonix for omeprazole   DVT prophylaxis: scd   Code Status: Full Family Communication:  discuss plan of care with the patient and family present at bedside  Disposition Plan: Likely discharge home once medically stable   Consults called: none Admission status: Observation  Norval Morton MD [facility] Pager [phone]  If 7PM-7AM, please contact night-coverage [email or web address] Password [facility]  12/21/2016, 12:59 AM

## 2016-12-21 NOTE — Consult Note (Signed)
Consultation  Referring Provider: [facility] hospitalist- Dr Fuller Plan Primary Care Physician:  [place] Not In System Primary Gastroenterologist:   ?  Was seen at [facility] 2017, and had prior w/u in [place]  Reason for Consultation:  [place] with syncope  HPI: Kimberly Horn is a 83 y.o. female who was admitted through the emergency room last evening after presenting with apparently for episodes of syncope at home over the past 2 days. She has been visiting her family from out of town over the past 3 weeks. She is From [place].  She has history of atrial fibrillation, is on Eliquis, has history of congestive heart failure, prior PE/DVT- last episode of DVT summer 2017 provoked ,pulmonary fibrosis, hypothyroidism and is status post pacemaker. She has previous history of peptic ulcer disease , and reports EGD done at [facility] hospital summer 2017, no overt GI bleeding per pt. Had been aware of having dark stools but was not aware of any grossly bloody stools over the past 3 weeks . Denies any abdominal pain, nausea, changes in bowel habits.  Pt reports requiring transfusion about 3 weeks ago prior to coming to [place], had transfusions last summer .She had Prior EGD and Colonoscopy  A few years ago in [place]- knows she had diverticulosis, not sure of other findings. On arrival to ER blood pressure 87/45, and documented Hemoccult-positive and had hemoglobin of 8.8 on arrival., BUN 15 creatinine 0.85. Hemoglobin down to 8.4 this morning and she is being  Transfused.  Daughter is   physician - Lener [phone]   Past Medical History:  Diagnosis Date  . Atrial fibrillation ([place])   . CHF (congestive heart failure) ([place])   . DVT (deep venous thrombosis) ([place])   . GI bleed   . Hypothyroidism   . Pacemaker   . Pulmonary embolism ([place])   . Pulmonary fibrosis ([place])     Past Surgical History:  Procedure Laterality Date  . ABLATION    . PACEMAKER INSERTION      Prior to  Admission medications   Medication Sig Start Date End Date Taking? Authorizing Provider  apixaban (ELIQUIS) 5 MG TABS tablet Take 5 mg by mouth 2 (two) times daily.   Yes Historical Provider, MD  DULoxetine (CYMBALTA) 30 MG capsule Take 30 mg by mouth 2 (two) times daily.   Yes Historical Provider, MD  furosemide (LASIX) 20 MG tablet Take 20 mg by mouth daily.   Yes Historical Provider, MD  levothyroxine (SYNTHROID, LEVOTHROID) 100 MCG tablet Take 100 mcg by mouth every evening.   Yes Historical Provider, MD  Multiple Vitamin (MULTIVITAMIN WITH MINERALS) TABS tablet Take 1 tablet by mouth daily.   Yes Historical Provider, MD  omeprazole (PRILOSEC) 40 MG capsule Take 40 mg by mouth 2 (two) times daily.   Yes Historical Provider, MD  potassium chloride SA (K-DUR,KLOR-CON) 20 MEQ tablet Take 20 mEq by mouth daily.   Yes Historical Provider, MD  sucralfate (CARAFATE) 1 GM/10ML suspension Take 1 g by mouth 2 (two) times daily.   Yes Historical Provider, MD    Current Facility-Administered Medications  Medication Dose Route Frequency Provider Last Rate Last Dose  . 0.9 %  sodium chloride infusion   Intravenous Continuous Norval Morton, MD 75 mL/hr at 12/21/16 0415 1,000 mL at 12/21/16 0415  . acetaminophen (TYLENOL) tablet 650 mg  650 mg Oral Q6H PRN Norval Morton, MD   650 mg at 12/21/16 [id number]   Or  . acetaminophen (  TYLENOL) suppository 650 mg  650 mg Rectal Q6H PRN Rondell A Tamala Julian, MD      . albuterol (PROVENTIL) (2.5 MG/3ML) 0.083% nebulizer solution 2.5 mg  2.5 mg Nebulization Q2H PRN Rondell A Tamala Julian, MD      . diphenhydrAMINE (BENADRYL) capsule 25 mg  25 mg Oral Q6H PRN Jonetta Osgood, MD   25 mg at 12/21/16 [id number]  . DULoxetine (CYMBALTA) DR capsule 30 mg  30 mg Oral BID Norval Morton, MD   30 mg at 12/21/16 0928  . levothyroxine (SYNTHROID, LEVOTHROID) tablet 100 mcg  100 mcg Oral QPM Rondell A Tamala Julian, MD      . multivitamin with minerals tablet 1 tablet  1 tablet Oral Daily Norval Morton, MD   1 tablet at 12/21/16 [phone]  . ondansetron (ZOFRAN) tablet 4 mg  4 mg Oral Q6H PRN Norval Morton, MD       Or  . ondansetron (ZOFRAN) injection 4 mg  4 mg Intravenous Q6H PRN Rondell A Tamala Julian, MD      . pantoprazole (PROTONIX) injection 40 mg  40 mg Intravenous Q12H Jonetta Osgood, MD   40 mg at 12/21/16 0929  . potassium chloride SA (K-DUR,KLOR-CON) CR tablet 20 mEq  20 mEq Oral Daily Norval Morton, MD   20 mEq at 12/21/16 0928  . sodium chloride flush (NS) 0.9 % injection 3 mL  3 mL Intravenous Q12H Rondell Charmayne Sheer, MD   3 mL at 12/21/16 1000  . sucralfate (CARAFATE) 1 GM/10ML suspension 1 g  1 g Oral BID WC Norval Morton, MD   1 g at 12/21/16 [id number]    Allergies as of 12/20/2016 - Review Complete 12/20/2016  Allergen Reaction Noted  . Demerol [meperidine] Other (See Comments) 12/20/2016  . Hydroxyzine Other (See Comments) 12/20/2016  . Talwin [pentazocine] Other (See Comments) 12/20/2016    No family history on file.  Social History   Social History  . Marital status: Widowed    Spouse name: N/A  . Number of children: N/A  . Years of education: N/A   Occupational History  . Not on file.   Social History Main Topics  . Smoking status: Never Smoker  . Smokeless tobacco: Never Used  . Alcohol use No  . Drug use: No  . Sexual activity: Not on file   Other Topics Concern  . Not on file   Social History Narrative  . No narrative on file    Review of Systems: Pertinent positive and negative review of systems were noted in the above HPI section.  All other review of systems was otherwise negative.  Physical Exam: Vital signs in last 24 hours: Temp:  [98.1 F (36.7 C)-98.7 F (37.1 C)] 98.1 F (36.7 C) (02/17 0912) Pulse Rate:  [72-114] 75 (02/17 0912) Resp:  [12-21] 18 (02/17 0912) BP: (87-140)/(45-85) 127/54 (02/17 0912) SpO2:  [69 %-100 %] 100 % (02/17 0912) Weight:  [138 lb 8 oz (62.8 kg)] 138 lb 8 oz (62.8 kg) (02/17 0233) Last BM Date:  12/20/16 General:   Alert,  Well-developed, well-nourished,elderly WF pleasant and cooperative in NAD Head:  Normocephalic and atraumatic. Eyes:  Sclera clear, no icterus.   Conjunctiva pale  Ears:  Normal auditory acuity. Nose:  No deformity, discharge,  or lesions. Mouth:  No deformity or lesions.   Neck:  Supple; no masses or thyromegaly. Lungs:  Clear throughout to auscultation.   No wheezes, crackles, or rhonchi. Heart:  irRegular rate  and rhythm; no murmurs, clicks, rubs,  or gallops.Pacer left chest Abdomen:  Soft,nontender, BS active,nonpalp mass or hsm.   Rectal:  Deferred -documented heme positive on admit Msk:  Symmetrical without gross deformities. . Pulses:  Normal pulses noted. Extremities:  Without clubbing or edema. Neurologic:  Alert and  oriented x4;  grossly normal neurologically. Skin:  Intact without significant lesions or rashes.. Psych:  Alert and cooperative. Normal mood and affect.  Intake/Output from previous day: 02/16 0701 - 02/17 0700 In: -  Out: 300 [Urine:300] Intake/Output this shift: No intake/output data recorded.  Lab Results:  Recent Labs  12/20/16 2111 12/21/16 0456  WBC 3.9*  --   HGB 8.8* 8.4*  HCT 27.0* 25.5*  PLT 240  --    BMET  Recent Labs  12/20/16 2112  NA 138  K 3.7  CL 104  CO2 27  GLUCOSE 145*  BUN 15  CREATININE 0.85  CALCIUM 9.0   LFT  Recent Labs  12/20/16 2112  PROT 6.1*  ALBUMIN 3.3*  AST 18  ALT 19  ALKPHOS 51  BILITOT 0.5   PT/INR No results for input(s): LABPROT, INR in the last 72 hours. Hepatitis Panel No results for input(s): HEPBSAG, HCVAB, HEPAIGM, HEPBIGM in the last 72 hours.    IMPRESSION:  #33  83 year old white female, admitted through the emergency room last evening after several syncopal episodes at home over the past 3 days. Patient reported having dark to black stools over the past 3 weeks in setting of chronic eliquis (last dose yesterday 12/20/2016) She was hypotensive on  arrival with hemoglobin 8.8. She has been documented Hemoccult positive. Patient has had previous GI evaluations including one endoscopy done at [facility] last summer at which time she may have had an ulcer. Records are pending and are not available in care everywhere. She also appears to be having intermittent chronic GI blood loss with transfusion requirement 3-4 weeks ago, and several months ago when she was at [facility]. Current picture consistent with subacute GI bleed. We will need to rule out peptic ulcer disease, chronic gastropathy, AVMs, less likely occult colonic lesion  #2 anemia normocytic secondary to GI blood loss #3 history of atrial fibrillation #4 previous history of DVT and PE remotely and again with DVT summer 2017, provoked #5 congestive heart failure #6 status post pacemaker  Plan;   #1 Full liquid diet today, nothing by mouth after midnight #2Transfuse to keep hgb 8-9 range  #3 hold Eliquis- may need IVC filter  #4 IV PPI BID #5 Iron studies #6Will plan for EGD in am tomorrow with Dr Silverio Decamp Procedure discussed in detail with the patient today including risks benefits and she is agreeable to proceed #7 will await records from [facility], and review, and also speak with patient's daughter regarding timing of previous GI evaluations and colonoscopy.  Thank you Will follow with you    Jacci Ruberg  12/21/2016, 10:29 AM

## 2016-12-22 ENCOUNTER — Encounter (HOSPITAL_COMMUNITY)
Admission: EM | Disposition: A | Discharge status: Home Health | Payer: Self-pay | Source: Home / Self Care | Attending: Internal Medicine

## 2016-12-22 ENCOUNTER — Inpatient Hospital Stay (HOSPITAL_COMMUNITY): Payer: Medicare Other | Admitting: Certified Registered Nurse Anesthetist

## 2016-12-22 ENCOUNTER — Encounter (HOSPITAL_COMMUNITY): Payer: Self-pay | Admitting: *Deleted

## 2016-12-22 HISTORY — PX: ESOPHAGOGASTRODUODENOSCOPY (EGD) WITH PROPOFOL: SHX5813

## 2016-12-22 LAB — BASIC METABOLIC PANEL
ANION GAP: 7 (ref 5–15)
BUN: 12 mg/dL (ref 6–20)
CO2: 25 mmol/L (ref 22–32)
Calcium: 8.2 mg/dL — ABNORMAL LOW (ref 8.9–10.3)
Chloride: 108 mmol/L (ref 101–111)
Creatinine, Ser: 0.79 mg/dL (ref 0.44–1.00)
GFR calc Af Amer: >60 mL/min (ref >60)
Glucose, Bld: 93 mg/dL (ref 65–99)
POTASSIUM: 3.6 mmol/L (ref 3.5–5.1)
Sodium: 140 mmol/L (ref 135–145)

## 2016-12-22 LAB — CBC
HCT: 24 % — ABNORMAL LOW (ref 36.0–46.0)
Hemoglobin: 7.9 g/dL — ABNORMAL LOW (ref 12.0–15.0)
MCH: 35 pg — ABNORMAL HIGH (ref 26.0–34.0)
MCHC: 32.9 g/dL (ref 30.0–36.0)
MCV: 106.2 fL — AB (ref 78.0–100.0)
PLATELETS: 178 10*3/uL (ref 150–400)
RBC: 2.26 MIL/uL — AB (ref 3.87–5.11)
RDW: 20.9 % — ABNORMAL HIGH (ref 11.5–15.5)
WBC: 2.9 10*3/uL — AB (ref 4.0–10.5)

## 2016-12-22 LAB — HEMOGLOBIN AND HEMATOCRIT, BLOOD
HEMATOCRIT: 29.8 % — AB (ref 36.0–46.0)
HEMOGLOBIN: 9.8 g/dL — AB (ref 12.0–15.0)

## 2016-12-22 LAB — PREPARE RBC (CROSSMATCH)

## 2016-12-22 SURGERY — ESOPHAGOGASTRODUODENOSCOPY (EGD) WITH PROPOFOL
Anesthesia: Monitor Anesthesia Care

## 2016-12-22 SURGERY — EGD (ESOPHAGOGASTRODUODENOSCOPY)
Anesthesia: Choice

## 2016-12-22 MED ORDER — BUTAMBEN-TETRACAINE-BENZOCAINE 2-2-14 % EX AERO
INHALATION_SPRAY | CUTANEOUS | Status: DC | PRN
  Administered 2016-12-22: 2 via TOPICAL

## 2016-12-22 MED ORDER — DIPHENHYDRAMINE HCL 50 MG/ML IJ SOLN: 25.0000 mg | Freq: Four times a day (QID) | INTRAMUSCULAR | Status: DC | PRN

## 2016-12-22 MED ORDER — PROPOFOL 500 MG/50ML IV EMUL
INTRAVENOUS | Status: DC | PRN
  Administered 2016-12-22: 50 ug/kg/min via INTRAVENOUS

## 2016-12-22 MED ORDER — LIDOCAINE 2% (20 MG/ML) 5 ML SYRINGE
INTRAMUSCULAR | Status: DC | PRN
  Administered 2016-12-22: 20 mg via INTRAVENOUS

## 2016-12-22 MED ORDER — PHENYLEPHRINE 40 MCG/ML (10ML) SYRINGE FOR IV PUSH (FOR BLOOD PRESSURE SUPPORT)
PREFILLED_SYRINGE | INTRAVENOUS | Status: DC | PRN
  Administered 2016-12-22: 80 ug via INTRAVENOUS
  Administered 2016-12-22 (×2): 40 ug via INTRAVENOUS
  Administered 2016-12-22: 80 ug via INTRAVENOUS

## 2016-12-22 MED ORDER — PROPOFOL 10 MG/ML IV BOLUS
INTRAVENOUS | Status: DC | PRN
  Administered 2016-12-22 (×2): 20 mg via INTRAVENOUS

## 2016-12-22 MED ORDER — CYANOCOBALAMIN 1000 MCG/ML IJ SOLN
1000.0000 ug | Freq: Once | INTRAMUSCULAR | Status: AC
  Administered 2016-12-22: 1000 ug via INTRAMUSCULAR
  Filled 2016-12-22: qty 1

## 2016-12-22 MED ORDER — SODIUM CHLORIDE 0.9 % IV SOLN: INTRAVENOUS | Status: DC

## 2016-12-22 MED ORDER — LACTATED RINGERS IV SOLN
INTRAVENOUS | Status: DC
  Administered 2016-12-22: 09:00:00 via INTRAVENOUS

## 2016-12-22 MED ORDER — SODIUM CHLORIDE 0.9 % IV SOLN
Freq: Once | INTRAVENOUS | Status: AC
  Administered 2016-12-22: 13:00:00 via INTRAVENOUS

## 2016-12-22 NOTE — Anesthesia Preprocedure Evaluation (Signed)
Anesthesia Evaluation  Patient identified by MRN, date of birth, ID band Patient awake  General Assessment Comment:History noted. CG  Reviewed: Allergy & Precautions, NPO status , Patient's Chart, lab work & pertinent test results  Airway Mallampati: II  TM Distance: >3 FB     Dental   Pulmonary neg pulmonary ROS,    breath sounds clear to auscultation       Cardiovascular +CHF  + pacemaker  Rhythm:Regular Rate:Normal     Neuro/Psych negative neurological ROS     GI/Hepatic PUD,   Endo/Other  Hypothyroidism   Renal/GU negative Renal ROS     Musculoskeletal   Abdominal   Peds  Hematology  (+) anemia ,   Anesthesia Other Findings   Reproductive/Obstetrics                             Anesthesia Physical Anesthesia Plan  ASA: III  Anesthesia Plan: MAC   Post-op Pain Management:    Induction: Intravenous  Airway Management Planned: Simple Face Mask  Additional Equipment:   Intra-op Plan:   Post-operative Plan:   Informed Consent: I have reviewed the patients History and Physical, chart, labs and discussed the procedure including the risks, benefits and alternatives for the proposed anesthesia with the patient or authorized representative who has indicated his/her understanding and acceptance.   Dental advisory given  Plan Discussed with: CRNA and Anesthesiologist  Anesthesia Plan Comments:         Anesthesia Quick Evaluation

## 2016-12-22 NOTE — Progress Notes (Signed)
Received patient, alert and oriented x 4, on stretcher, accompanied by daughter in law, [place]. Per family member patient has been having episodes of syncope, with fall and LOC.  Patient is very Kimberly Horn, wearing a hearing aide in her left ear. Patient has a permanent pacemaker, which is ventricular pacing.Due to recent and multiple falls, patient is placed as High fall risk. This was explained to pt and family member. Placed on cardiac monitor, #29 and had 2nd verifier.Hospital booklet given. Pt oriented to room, call bell system and use of the telephone. Family members given number for patients phone.  Safety education done as well as the pain scale, which patient and family understood with verbal acknowledgment.  Bed alarm is on, side rails up X 2.  Safety maintained.

## 2016-12-22 NOTE — Interval H&P Note (Signed)
History and Physical Interval Note:  12/22/2016 8:59 AM  Kimberly Horn  has presented today for surgery, with the diagnosis of melena, anemia, syncopal episode  The various methods of treatment have been discussed with the patient and family. After consideration of risks, benefits and other options for treatment, the patient has consented to  Procedure(s): ESOPHAGOGASTRODUODENOSCOPY (EGD) WITH PROPOFOL (N/A) as a surgical intervention .  The patient's history has been reviewed, patient examined, no change in status, stable for surgery.  I have reviewed the patient's chart and labs.  Questions were answered to the patient's satisfaction.     Kavitha Nandigam

## 2016-12-22 NOTE — Anesthesia Procedure Notes (Signed)
Procedure Name: MAC Date/Time: 12/22/2016 9:12 AM Performed by: Everlean Cherry A Pre-anesthesia Checklist: Patient identified, Emergency Drugs available, Suction available and Patient being monitored Patient Re-evaluated:Patient Re-evaluated prior to inductionOxygen Delivery Method: Nasal cannula

## 2016-12-22 NOTE — Transfer of Care (Signed)
Immediate Anesthesia Transfer of Care Note  Patient: Kimberly Horn  Procedure(s) Performed: Procedure(s): ESOPHAGOGASTRODUODENOSCOPY (EGD) WITH PROPOFOL (N/A)  Patient Location: Endoscopy unit  Anesthesia Type:MAC  Level of Consciousness: awake, alert , oriented and patient cooperative  Airway & Oxygen Therapy: Patient Spontanous Breathing and Patient connected to nasal cannula oxygen  Post-op Assessment: Report given to RN and Post -op Vital signs reviewed and stable  Post vital signs: Reviewed and stable  Last Vitals:  Vitals:   12/22/16 0625 12/22/16 0840  BP: (!) 107/53 (!) 142/59  Pulse: 82 76  Resp: 16 20  Temp: 36.7 C 36.6 C    Last Pain:  Vitals:   12/22/16 0840  TempSrc: Oral  PainSc:       Patients Stated Pain Goal: 3 (18/48/59 2763)  Complications: No apparent anesthesia complications

## 2016-12-22 NOTE — Anesthesia Postprocedure Evaluation (Signed)
Anesthesia Post Note  Patient: Kimberly Horn  Procedure(s) Performed: Procedure(s) (LRB): ESOPHAGOGASTRODUODENOSCOPY (EGD) WITH PROPOFOL (N/A)  Patient location during evaluation: PACU Anesthesia Type: MAC Level of consciousness: awake Pain management: pain level controlled Respiratory status: spontaneous breathing Cardiovascular status: stable Anesthetic complications: no       Last Vitals:  Vitals:   12/22/16 1014 12/22/16 1020  BP: (!) 114/31 (!) 124/44  Pulse: 73 79  Resp: 11 18  Temp: 36.3 C     Last Pain:  Vitals:   12/22/16 1014  TempSrc: Oral  PainSc:                  Hubert Raatz

## 2016-12-22 NOTE — Progress Notes (Signed)
PROGRESS NOTE        PATIENT DETAILS Name: Kimberly Horn Age: 83 y.o. Sex: female Date of Birth: [birth year] Admit Date: 12/20/2016 Admitting Physician Norval Morton, MD PCP:Pcp Not In System  Brief Narrative: Patient is a 83 y.o. female with history of atrial fibrillation (status post ablation), permanent pacemaker placement, history of venous thromboembolism on chronic anticoagulation with Eliquis, prior history of PRBC transfusion for anemia (last-3 weeks back)-admitted for evaluation of 3-4 episodes of syncope along with melanotic stools.  Subjective: No melena since admission.  Assessment/Plan:  Syncope: Likely secondary to volume depletion/GI bleeding/orthostatic mechanism-her initial blood pressure in the ED was 87/45. EKG/telemetry shows a paced rhythm. Have asked RN to see if we can interrogate the pacemaker as well.  Upper GI bleed with acute blood loss anemia: Continue with IV PPI twice a day, being transfused 1 unit of PRBC. GI has been consulted EGD shows Dieulafoy lesion in the duodenum which was clipped, IV PPI, 1 unit PRBC on 12-22-16, DC Eliquis, risks and benefits discussed with patient and daughter [name] was a physician. They agree with the plan of discontinuing anticoagulation at this time.  Transient hypotension: Suspect secondary to above, resolved with supportive measures.  History of prior venous thromboembolism: 3 of 2 provoked DVTs in the past per daughter who is a physician, as dictated above discontinuing anticoagulation due to recurrent GI bleed. Patient getting third transfusion in 9 months..  History of atrial fibrillation/permanent pacemaker placement: Per family-patient has undergone ablation in the past-and required pacemaker placement sometime in 2016. He had vascular score of at least 3. Anticoagulation stopped. Patient understands the risks and benefits and agrees with the plan ,Daughter was a physician understands the risks and  benefits and agrees with the plan.  Essential hypertension: Continue to hold Lasix-blood pressure currently stable.  Hypothyroidism: Continue Synthroid  ? Prior history of peptic ulcer disease: EGD showing Dieulafoy lesion in the duodenum. On PPI per GI.    DVT Prophylaxis:  Prophylactic Lovenox or Heparin or SCD's, Full dose anticoagulation with Heparin/Lovenox/Coumadin/Eliquis/Xarelto/Pradaxa  Code Status:  Full code    Family Communication:  Daughter over the phone   Disposition Plan:  Remain inpatient-requires a few more days of inpatient hospitalization before discharge. Await PT eval to determine appropriate disposition.  Antimicrobial agents: Anti-infectives    None     Procedures:  EGD - Dieulafoy lesion in the duodenum.  CONSULTS:  GI  Time spent:  25- minutes-Greater than 50% of this time was spent in counseling, explanation of diagnosis, planning of further management, and coordination of care.  MEDICATIONS: Scheduled Meds: . DULoxetine  30 mg Oral BID  . levothyroxine  100 mcg Oral QPM  . multivitamin with minerals  1 tablet Oral Daily  . pantoprazole (PROTONIX) IV  40 mg Intravenous Q12H  . potassium chloride SA  20 mEq Oral Daily  . sodium chloride flush  3 mL Intravenous Q12H  . sucralfate  1 g Oral BID WC   Continuous Infusions: . sodium chloride Stopped (12/22/16 0828)   PRN Meds:.acetaminophen **OR** acetaminophen, albuterol, diphenhydrAMINE, ondansetron **OR** ondansetron (ZOFRAN) IV   PHYSICAL EXAM: Vital signs: Vitals:   12/22/16 1020 12/22/16 1025 12/22/16 1035 12/22/16 1051  BP: (!) 124/44 (!) 130/43 (!) 133/52 (!) 128/52  Pulse: 79 76 77 71  Resp: 18 15 20 17   Temp:  97.5 F (36.4 C)  TempSrc:    Oral  SpO2: 100% 100% 100% 100%  Weight:      Height:       Filed Weights   12/21/16 0233 12/22/16 0840  Weight: 62.8 kg (138 lb 8 oz) 62.8 kg (138 lb 8 oz)   Body mass index is 23.77 kg/m.   General appearance :Awake,  alert, not in any distress. Speech Clear. Hard of hearing Eyes:, pupils equally reactive to light and accomodation,no scleral icterus.Pink conjunctiva HEENT: Atraumatic and Normocephalic Neck: supple, no JVD. No cervical lymphadenopathy. No thyromegaly Resp:Good air entry bilaterally, no added sounds  CVS: S1 S2 regular GI: Bowel sounds present, Non tender and not distended with no gaurding, rigidity or rebound.No organomegaly Extremities: B/L Lower Ext shows no edema, both legs are warm to touch Neurology:  speech clear,Non focal, sensation is grossly intact. Psychiatric: Normal judgment and insight. Alert and oriented x 3. Normal mood. Musculoskeletal:No digital cyanosis Skin:No Rash, warm and dry Wounds:N/A  I have personally reviewed following labs and imaging studies  LABORATORY DATA: CBC:  Recent Labs Lab 12/20/16 2111 12/21/16 0456 12/21/16 1743 12/22/16 0523  WBC 3.9*  --   --  2.9*  HGB 8.8* 8.4* 8.5* 7.9*  HCT 27.0* 25.5* 26.0* 24.0*  MCV 109.3*  --   --  106.2*  PLT 240  --   --  0000000    Basic Metabolic Panel:  Recent Labs Lab 12/20/16 2112 12/22/16 0523  NA 138 140  K 3.7 3.6  CL 104 108  CO2 27 25  GLUCOSE 145* 93  BUN 15 12  CREATININE 0.85 0.79  CALCIUM 9.0 8.2*    GFR: Estimated Creatinine Clearance: 45.2 mL/min (by C-G formula based on SCr of 0.79 mg/dL).  Liver Function Tests:  Recent Labs Lab 12/20/16 2112  AST 18  ALT 19  ALKPHOS 51  BILITOT 0.5  PROT 6.1*  ALBUMIN 3.3*   No results for input(s): LIPASE, AMYLASE in the last 168 hours. No results for input(s): AMMONIA in the last 168 hours.  Coagulation Profile: No results for input(s): INR, PROTIME in the last 168 hours.  Cardiac Enzymes:  Recent Labs Lab 12/21/16 0456 12/21/16 1743  TROPONINI <0.03 <0.03    BNP (last 3 results) No results for input(s): PROBNP in the last 8760 hours.  HbA1C: No results for input(s): HGBA1C in the last 72 hours.  CBG: No results  for input(s): GLUCAP in the last 168 hours.  Lipid Profile: No results for input(s): CHOL, HDL, LDLCALC, TRIG, CHOLHDL, LDLDIRECT in the last 72 hours.  Thyroid Function Tests:  Recent Labs  12/21/16 0456  TSH 2.207    Anemia Panel:  Recent Labs  12/21/16 1743  VITAMINB12 306  FOLATE 28.5  FERRITIN 220  TIBC 263  IRON 209*  RETICCTPCT 2.8    Urine analysis: No results found for: COLORURINE, APPEARANCEUR, LABSPEC, PHURINE, GLUCOSEU, HGBUR, BILIRUBINUR, KETONESUR, PROTEINUR, UROBILINOGEN, NITRITE, LEUKOCYTESUR  Sepsis Labs: Lactic Acid, Venous No results found for: LATICACIDVEN  MICROBIOLOGY: No results found for this or any previous visit (from the past 240 hour(s)).  RADIOLOGY STUDIES/RESULTS: Dg Chest 2 View  Result Date: 12/20/2016 CLINICAL DATA:  Shortness of breath since a fall yesterday morning. EXAM: CHEST  2 VIEW COMPARISON:  None. FINDINGS: Cardiac pacemaker. Normal heart size and pulmonary vascularity. No focal airspace disease or consolidation in the lungs. Mild bronchiectasis and bronchial wall thickening suggesting bronchitis. This may be acute or chronic. No blunting of costophrenic angles.  No pneumothorax. Mediastinal contours appear intact. Calcification of the aorta. Degenerative changes in the shoulders and in the spine. Anterior compression of T12 with about 50% loss of height. Age is indeterminate. IMPRESSION: No focal consolidation or airspace disease. Bronchitic changes in the lungs may be acute or chronic. Anterior compression of T12 is age-indeterminate. Electronically Signed   By: Lucienne Capers M.D.   On: 12/20/2016 21:42     LOS: 1 day   Thurnell Lose, MD  [facility] Pager:[phone]  If 7PM-7AM, please contact night-coverage [email or web address] Password [email or web address] 12/22/2016, 11:19 AM

## 2016-12-22 NOTE — Op Note (Signed)
[facility] Patient Name: Kimberly Horn Procedure Date : 12/22/2016 MRN: [id number] Attending MD: Mauri Pole , MD Date of Birth: [birth year] CSN: [id number] Age: 83 Admit Type: Inpatient Procedure:                Upper GI endoscopy Indications:              Active gastrointestinal bleeding, Suspected upper                            gastrointestinal bleeding Providers:                Mauri Pole, MD, Dustin Flock RN, RN,                            Corliss Parish, Technician Referring MD:              Medicines:                Monitored Anesthesia Care Complications:            No immediate complications. Estimated Blood Loss:     Estimated blood loss was minimal. Procedure:                Pre-Anesthesia Assessment:                           - Prior to the procedure, a History and Physical                            was performed, and patient medications and                            allergies were reviewed. The patient's tolerance of                            previous anesthesia was also reviewed. The risks                            and benefits of the procedure and the sedation                            options and risks were discussed with the patient.                            All questions were answered, and informed consent                            was obtained. Prior Anticoagulants: The patient                            last took Eliquis (apixaban) 2 days prior to the                            procedure. ASA Grade Assessment: III - A patient  with severe systemic disease. After reviewing the                            risks and benefits, the patient was deemed in                            satisfactory condition to undergo the procedure.                           After obtaining informed consent, the endoscope was                            passed under direct vision. Throughout the   procedure, the patient's blood pressure, pulse, and                            oxygen saturations were monitored continuously. The                            [id number]) scope was introduced through the                            mouth, and advanced to the third part of duodenum.                            The upper GI endoscopy was accomplished without                            difficulty. The patient tolerated the procedure                            well. Scope In: Scope Out: Findings:      The esophagus was normal.      A non-bleeding superficial mucosal tear that was pre-existing       (pre-procedure) was found in the cardia. This measured 9 mm in length.      A single spot with hematin (altered blood/coffee-ground-like material)       was found in the second portion of the duodenum with active oozing of       blood noted on washing of hematin, underlying mucosa appeared normal       concerning for Dieulafoy lesion. Coagulation for hemostasis using argon       plasma was successful. To stop active bleeding, three hemostatic clips       were successfully placed (MR conditional). There was no bleeding at the       end of the procedure. Impression:               - Normal esophagus.                           - Superficial mucosal tear in the cardia.                           - Dieulafoy lesion in the duodenum . Treated with  argon plasma coagulation (APC). Clips (MR                            conditional) were placed.                           - No specimens collected. Moderate Sedation:      N/A Recommendation:           - Patient has a contact number available for                            emergencies. The signs and symptoms of potential                            delayed complications were discussed with the                            patient. Return to normal activities tomorrow.                            Written discharge instructions were  provided to the                            patient.                           - Clear liquid diet today and advance diet as                            tolerated                           - Continue present medications.                           - Protonix 40mg  daily                           - No ibuprofen, naproxen, or other non-steroidal                            anti-inflammatory drugs. Procedure Code(s):        --- Professional ---                           [phone], Esophagogastroduodenoscopy, flexible,                            transoral; with control of bleeding, any method Diagnosis Code(s):        --- Professional ---                           [id number], Other injury of stomach, initial encounter                           K92.2, Gastrointestinal hemorrhage, unspecified CPT copyright 2016 [employer]. All rights reserved.  The codes documented in this report are preliminary and upon coder review may  be revised to meet current compliance requirements. Mauri Pole, MD 12/22/2016 10:51:55 AM This report has been signed electronically. Number of Addenda: 0

## 2016-12-22 NOTE — H&P (View-Only) (Signed)
Consultation  Referring Provider: [facility]- Dr Fuller Plan Primary Care Physician:  [place] Not In System Primary Gastroenterologist:   ?  Was seen at [facility] 2017, and had prior w/u in [place]  Reason for Consultation:  [place] with syncope  HPI: Kimberly Horn is a 83 y.o. female who was admitted through the emergency room last evening after presenting with apparently for episodes of syncope at home over the past 2 days. She has been visiting her family from out of town over the past 3 weeks. She is From [place].  She has history of atrial fibrillation, is on Eliquis, has history of congestive heart failure, prior PE/DVT- last episode of DVT summer 2017 provoked ,pulmonary fibrosis, hypothyroidism and is status post pacemaker. She has previous history of peptic ulcer disease , and reports EGD done at [facility] summer 2017, no overt GI bleeding per pt. Had been aware of having dark stools but was not aware of any grossly bloody stools over the past 3 weeks . Denies any abdominal pain, nausea, changes in bowel habits.  Pt reports requiring transfusion about 3 weeks ago prior to coming to [place], had transfusions last summer .She had Prior EGD and Colonoscopy  A few years ago in [place]- knows she had diverticulosis, not sure of other findings. On arrival to ER blood pressure 87/45, and documented Hemoccult-positive and had hemoglobin of 8.8 on arrival., BUN 15 creatinine 0.85. Hemoglobin down to 8.4 this morning and she is being  Transfused.  Daughter is   physician - Braelin [phone]   Past Medical History:  Diagnosis Date  . Atrial fibrillation ([place])   . CHF (congestive heart failure) ([place])   . DVT (deep venous thrombosis) ([place])   . GI bleed   . Hypothyroidism   . Pacemaker   . Pulmonary embolism ([place])   . Pulmonary fibrosis ([place])     Past Surgical History:  Procedure Laterality Date  . ABLATION    . PACEMAKER INSERTION      Prior to  Admission medications   Medication Sig Start Date End Date Taking? Authorizing Provider  apixaban (ELIQUIS) 5 MG TABS tablet Take 5 mg by mouth 2 (two) times daily.   Yes Historical Provider, MD  DULoxetine (CYMBALTA) 30 MG capsule Take 30 mg by mouth 2 (two) times daily.   Yes Historical Provider, MD  furosemide (LASIX) 20 MG tablet Take 20 mg by mouth daily.   Yes Historical Provider, MD  levothyroxine (SYNTHROID, LEVOTHROID) 100 MCG tablet Take 100 mcg by mouth every evening.   Yes Historical Provider, MD  Multiple Vitamin (MULTIVITAMIN WITH MINERALS) TABS tablet Take 1 tablet by mouth daily.   Yes Historical Provider, MD  omeprazole (PRILOSEC) 40 MG capsule Take 40 mg by mouth 2 (two) times daily.   Yes Historical Provider, MD  potassium chloride SA (K-DUR,KLOR-CON) 20 MEQ tablet Take 20 mEq by mouth daily.   Yes Historical Provider, MD  sucralfate (CARAFATE) 1 GM/10ML suspension Take 1 g by mouth 2 (two) times daily.   Yes Historical Provider, MD    Current Facility-Administered Medications  Medication Dose Route Frequency Provider Last Rate Last Dose  . 0.9 %  sodium chloride infusion   Intravenous Continuous Norval Morton, MD 75 mL/hr at 12/21/16 0415 1,000 mL at 12/21/16 0415  . acetaminophen (TYLENOL) tablet 650 mg  650 mg Oral Q6H PRN Norval Morton, MD   650 mg at 12/21/16 [id number]   Or  . acetaminophen (  TYLENOL) suppository 650 mg  650 mg Rectal Q6H PRN Rondell A Tamala Julian, MD      . albuterol (PROVENTIL) (2.5 MG/3ML) 0.083% nebulizer solution 2.5 mg  2.5 mg Nebulization Q2H PRN Rondell A Tamala Julian, MD      . diphenhydrAMINE (BENADRYL) capsule 25 mg  25 mg Oral Q6H PRN Jonetta Osgood, MD   25 mg at 12/21/16 [id number]  . DULoxetine (CYMBALTA) DR capsule 30 mg  30 mg Oral BID Norval Morton, MD   30 mg at 12/21/16 0928  . levothyroxine (SYNTHROID, LEVOTHROID) tablet 100 mcg  100 mcg Oral QPM Rondell A Tamala Julian, MD      . multivitamin with minerals tablet 1 tablet  1 tablet Oral Daily Norval Morton, MD   1 tablet at 12/21/16 [phone]  . ondansetron (ZOFRAN) tablet 4 mg  4 mg Oral Q6H PRN Norval Morton, MD       Or  . ondansetron (ZOFRAN) injection 4 mg  4 mg Intravenous Q6H PRN Rondell A Tamala Julian, MD      . pantoprazole (PROTONIX) injection 40 mg  40 mg Intravenous Q12H Jonetta Osgood, MD   40 mg at 12/21/16 0929  . potassium chloride SA (K-DUR,KLOR-CON) CR tablet 20 mEq  20 mEq Oral Daily Norval Morton, MD   20 mEq at 12/21/16 0928  . sodium chloride flush (NS) 0.9 % injection 3 mL  3 mL Intravenous Q12H Rondell Charmayne Sheer, MD   3 mL at 12/21/16 1000  . sucralfate (CARAFATE) 1 GM/10ML suspension 1 g  1 g Oral BID WC Norval Morton, MD   1 g at 12/21/16 [id number]    Allergies as of 12/20/2016 - Review Complete 12/20/2016  Allergen Reaction Noted  . Demerol [meperidine] Other (See Comments) 12/20/2016  . Hydroxyzine Other (See Comments) 12/20/2016  . Talwin [pentazocine] Other (See Comments) 12/20/2016    No family history on file.  Social History   Social History  . Marital status: Widowed    Spouse name: N/A  . Number of children: N/A  . Years of education: N/A   Occupational History  . Not on file.   Social History Main Topics  . Smoking status: Never Smoker  . Smokeless tobacco: Never Used  . Alcohol use No  . Drug use: No  . Sexual activity: Not on file   Other Topics Concern  . Not on file   Social History Narrative  . No narrative on file    Review of Systems: Pertinent positive and negative review of systems were noted in the above HPI section.  All other review of systems was otherwise negative.  Physical Exam: Vital signs in last 24 hours: Temp:  [98.1 F (36.7 C)-98.7 F (37.1 C)] 98.1 F (36.7 C) (02/17 0912) Pulse Rate:  [72-114] 75 (02/17 0912) Resp:  [12-21] 18 (02/17 0912) BP: (87-140)/(45-85) 127/54 (02/17 0912) SpO2:  [69 %-100 %] 100 % (02/17 0912) Weight:  [138 lb 8 oz (62.8 kg)] 138 lb 8 oz (62.8 kg) (02/17 0233) Last BM Date:  12/20/16 General:   Alert,  Well-developed, well-nourished,elderly WF pleasant and cooperative in NAD Head:  Normocephalic and atraumatic. Eyes:  Sclera clear, no icterus.   Conjunctiva pale  Ears:  Normal auditory acuity. Nose:  No deformity, discharge,  or lesions. Mouth:  No deformity or lesions.   Neck:  Supple; no masses or thyromegaly. Lungs:  Clear throughout to auscultation.   No wheezes, crackles, or rhonchi. Heart:  irRegular rate  and rhythm; no murmurs, clicks, rubs,  or gallops.Pacer left chest Abdomen:  Soft,nontender, BS active,nonpalp mass or hsm.   Rectal:  Deferred -documented heme positive on admit Msk:  Symmetrical without gross deformities. . Pulses:  Normal pulses noted. Extremities:  Without clubbing or edema. Neurologic:  Alert and  oriented x4;  grossly normal neurologically. Skin:  Intact without significant lesions or rashes.. Psych:  Alert and cooperative. Normal mood and affect.  Intake/Output from previous day: 02/16 0701 - 02/17 0700 In: -  Out: 300 [Urine:300] Intake/Output this shift: No intake/output data recorded.  Lab Results:  Recent Labs  12/20/16 2111 12/21/16 0456  WBC 3.9*  --   HGB 8.8* 8.4*  HCT 27.0* 25.5*  PLT 240  --    BMET  Recent Labs  12/20/16 2112  NA 138  K 3.7  CL 104  CO2 27  GLUCOSE 145*  BUN 15  CREATININE 0.85  CALCIUM 9.0   LFT  Recent Labs  12/20/16 2112  PROT 6.1*  ALBUMIN 3.3*  AST 18  ALT 19  ALKPHOS 51  BILITOT 0.5   PT/INR No results for input(s): LABPROT, INR in the last 72 hours. Hepatitis Panel No results for input(s): HEPBSAG, HCVAB, HEPAIGM, HEPBIGM in the last 72 hours.    IMPRESSION:  #12  83 year old white female, admitted through the emergency room last evening after several syncopal episodes at home over the past 3 days. Patient reported having dark to black stools over the past 3 weeks in setting of chronic eliquis (last dose yesterday 12/20/2016) She was hypotensive on  arrival with hemoglobin 8.8. She has been documented Hemoccult positive. Patient has had previous GI evaluations including one endoscopy done at [facility] last summer at which time she may have had an ulcer. Records are pending and are not available in care everywhere. She also appears to be having intermittent chronic GI blood loss with transfusion requirement 3-4 weeks ago, and several months ago when she was at [facility]. Current picture consistent with subacute GI bleed. We will need to rule out peptic ulcer disease, chronic gastropathy, AVMs, less likely occult colonic lesion  #2 anemia normocytic secondary to GI blood loss #3 history of atrial fibrillation #4 previous history of DVT and PE remotely and again with DVT summer 2017, provoked #5 congestive heart failure #6 status post pacemaker  Plan;   #1 Full liquid diet today, nothing by mouth after midnight #2Transfuse to keep hgb 8-9 range  #3 hold Eliquis- may need IVC filter  #4 IV PPI BID #5 Iron studies #6Will plan for EGD in am tomorrow with Dr Silverio Decamp Procedure discussed in detail with the patient today including risks benefits and she is agreeable to proceed #7 will await records from [facility], and review, and also speak with patient's daughter regarding timing of previous GI evaluations and colonoscopy.  Thank you Will follow with you    Janeene Sand  12/21/2016, 10:29 AM

## 2016-12-23 DIAGNOSIS — K921 Melena: Secondary | ICD-10-CM

## 2016-12-23 DIAGNOSIS — I482 Chronic atrial fibrillation: Secondary | ICD-10-CM

## 2016-12-23 DIAGNOSIS — K558 Other vascular disorders of intestine: Secondary | ICD-10-CM

## 2016-12-23 DIAGNOSIS — Z7901 Long term (current) use of anticoagulants: Secondary | ICD-10-CM

## 2016-12-23 DIAGNOSIS — D62 Acute posthemorrhagic anemia: Secondary | ICD-10-CM

## 2016-12-23 LAB — CBC
HCT: 27.3 % — ABNORMAL LOW (ref 36.0–46.0)
Hemoglobin: 9.1 g/dL — ABNORMAL LOW (ref 12.0–15.0)
MCH: 34.7 pg — ABNORMAL HIGH (ref 26.0–34.0)
MCHC: 33.3 g/dL (ref 30.0–36.0)
MCV: 104.2 fL — AB (ref 78.0–100.0)
PLATELETS: 177 10*3/uL (ref 150–400)
RBC: 2.62 MIL/uL — ABNORMAL LOW (ref 3.87–5.11)
RDW: 21.7 % — AB (ref 11.5–15.5)
WBC: 3.5 10*3/uL — AB (ref 4.0–10.5)

## 2016-12-23 LAB — URINALYSIS, ROUTINE W REFLEX MICROSCOPIC
BILIRUBIN URINE: NEGATIVE
GLUCOSE, UA: NEGATIVE mg/dL
HGB URINE DIPSTICK: NEGATIVE
KETONES UR: NEGATIVE mg/dL
LEUKOCYTES UA: NEGATIVE
Nitrite: NEGATIVE
PH: 5 (ref 5.0–8.0)
PROTEIN: NEGATIVE mg/dL
Specific Gravity, Urine: 1.015 (ref 1.005–1.030)

## 2016-12-23 LAB — BASIC METABOLIC PANEL
Anion gap: 5 (ref 5–15)
BUN: 7 mg/dL (ref 6–20)
CHLORIDE: 109 mmol/L (ref 101–111)
CO2: 28 mmol/L (ref 22–32)
CREATININE: 0.78 mg/dL (ref 0.44–1.00)
Calcium: 8.1 mg/dL — ABNORMAL LOW (ref 8.9–10.3)
GFR calc non Af Amer: >60 mL/min (ref >60)
Glucose, Bld: 94 mg/dL (ref 65–99)
POTASSIUM: 3.9 mmol/L (ref 3.5–5.1)
SODIUM: 142 mmol/L (ref 135–145)

## 2016-12-23 LAB — HEMOGLOBIN AND HEMATOCRIT, BLOOD
HEMATOCRIT: 28 % — AB (ref 36.0–46.0)
HEMATOCRIT: 28.8 % — AB (ref 36.0–46.0)
HEMOGLOBIN: 9.4 g/dL — AB (ref 12.0–15.0)
Hemoglobin: 9.4 g/dL — ABNORMAL LOW (ref 12.0–15.0)

## 2016-12-23 MED ORDER — PANTOPRAZOLE SODIUM 40 MG PO TBEC
40.0000 mg | DELAYED_RELEASE_TABLET | Freq: Two times a day (BID) | ORAL | Status: DC
  Administered 2016-12-23 – 2016-12-24 (×3): 40 mg via ORAL
  Filled 2016-12-23 (×3): qty 1

## 2016-12-23 NOTE — Progress Notes (Signed)
PROGRESS NOTE        PATIENT DETAILS Name: Kimberly Horn Age: 83 y.o. Sex: female Date of Birth: 20-Feb-1930 Admit Date: 12/20/2016 Admitting Physician Norval Morton, MD PCP:Pcp Not In System  Brief Narrative: Patient is a 83 y.o. female with history of atrial fibrillation (status post ablation), permanent pacemaker placement, history of venous thromboembolism on chronic anticoagulation with Eliquis, prior history of PRBC transfusion for anemia (last-3 weeks back)-admitted for evaluation of 3-4 episodes of syncope along with melanotic stools.  Subjective: No melena since admission.  Assessment/Plan:  Syncope: Likely secondary to volume depletion/GI bleeding/orthostatic mechanism-her initial blood pressure in the ED was 87/45. EKG/telemetry shows a paced rhythm.   Upper GI bleed with acute blood loss anemia: Continue with IV PPI twice a day, being transfused 1 unit of PRBC. GI has been consulted EGD shows Dieulafoy lesion in the duodenum which was clipped, IV PPI, 1 unit PRBC on 12-22-16, DC'd Eliquis, risks and benefits discussed with patient and daughter [name] who is a physician. They agree with the plan of discontinuing anticoagulation at this time.Continue to monitor H&H closely for another 24hrs.  Transient hypotension: Suspect secondary to above, resolved with supportive measures.  History of prior venous thromboembolism: 3 of 2 provoked DVTs in the past per daughter who is a physician, as dictated above discontinuing anticoagulation due to recurrent GI bleed. Patient getting her third transfusion in 9 months..  History of atrial fibrillation/permanent pacemaker placement: Per family-patient has undergone ablation in the past-and required pacemaker placement sometime in 2016. He had vascular score of at least 3. Anticoagulation stopped. Patient understands the risks and benefits and agrees with the plan, daughter who is a physician understands the risks and  benefits and agrees with the plan.  Essential hypertension: Continue to hold Lasix-blood pressure currently stable.  Hypothyroidism: Continue Synthroid  ? Prior history of peptic ulcer disease: EGD showing Dieulafoy lesion in the duodenum. On PPI per GI.    DVT Prophylaxis: SCDs  Code Status:  Full code    Family Communication:  Daughter over the phone   Disposition Plan:  Remain inpatient-requires a few more days of inpatient hospitalization before discharge. Await PT eval to determine appropriate disposition.  Antimicrobial agents: Anti-infectives    None     Procedures:  EGD - Dieulafoy lesion in the duodenum.  CONSULTS:  GI  Time spent:  25- minutes-Greater than 50% of this time was spent in counseling, explanation of diagnosis, planning of further management, and coordination of care.  MEDICATIONS: Scheduled Meds: . DULoxetine  30 mg Oral BID  . levothyroxine  100 mcg Oral QPM  . multivitamin with minerals  1 tablet Oral Daily  . pantoprazole  40 mg Oral BID AC  . potassium chloride SA  20 mEq Oral Daily  . sodium chloride flush  3 mL Intravenous Q12H  . sucralfate  1 g Oral BID WC   Continuous Infusions:  PRN Meds:.albuterol, diphenhydrAMINE, [DISCONTINUED] ondansetron **OR** ondansetron (ZOFRAN) IV   PHYSICAL EXAM: Vital signs: Vitals:   12/22/16 1450 12/22/16 1703 12/22/16 2130 12/23/16 0435  BP: (!) 118/48 (!) 102/46 (!) 139/57 (!) 116/54  Pulse: 81 75 79 74  Resp: (!) 22 18 16 16   Temp:  98.6 F (37 C) 98.9 F (37.2 C) 98.1 F (36.7 C)  TempSrc:   Oral Oral  SpO2:  100% 100% 100%  Weight:      Height:       Filed Weights   12/21/16 0233 12/22/16 0840  Weight: 62.8 kg (138 lb 8 oz) 62.8 kg (138 lb 8 oz)   Body mass index is 23.77 kg/m.   General appearance :Awake, alert, not in any distress. Speech Clear. Hard of hearing Eyes:, pupils equally reactive to light and accomodation,no scleral icterus.Pink conjunctiva HEENT: Atraumatic  and Normocephalic Neck: supple, no JVD. No cervical lymphadenopathy. No thyromegaly Resp:Good air entry bilaterally, no added sounds  CVS: S1 S2 regular GI: Bowel sounds present, Non tender and not distended with no gaurding, rigidity or rebound.No organomegaly Extremities: B/L Lower Ext shows no edema, both legs are warm to touch Neurology:  speech clear,Non focal, sensation is grossly intact. Psychiatric: Normal judgment and insight. Alert and oriented x 3. Normal mood. Musculoskeletal:No digital cyanosis Skin:No Rash, warm and dry Wounds:N/A  I have personally reviewed following labs and imaging studies  LABORATORY DATA: CBC:  Recent Labs Lab 12/20/16 2111 12/21/16 0456 12/21/16 1743 12/22/16 0523 12/22/16 1808 12/23/16 0527  WBC 3.9*  --   --  2.9*  --  3.5*  HGB 8.8* 8.4* 8.5* 7.9* 9.8* 9.1*  HCT 27.0* 25.5* 26.0* 24.0* 29.8* 27.3*  MCV 109.3*  --   --  106.2*  --  104.2*  PLT 240  --   --  178  --  [id number]    Basic Metabolic Panel:  Recent Labs Lab 12/20/16 2112 12/22/16 0523 12/23/16 0527  NA 138 140 142  K 3.7 3.6 3.9  CL 104 108 109  CO2 27 25 28   GLUCOSE 145* 93 94  BUN 15 12 7   CREATININE 0.85 0.79 0.78  CALCIUM 9.0 8.2* 8.1*    GFR: Estimated Creatinine Clearance: 45.2 mL/min (by C-G formula based on SCr of 0.78 mg/dL).  Liver Function Tests:  Recent Labs Lab 12/20/16 2112  AST 18  ALT 19  ALKPHOS 51  BILITOT 0.5  PROT 6.1*  ALBUMIN 3.3*   No results for input(s): LIPASE, AMYLASE in the last 168 hours. No results for input(s): AMMONIA in the last 168 hours.  Coagulation Profile: No results for input(s): INR, PROTIME in the last 168 hours.  Cardiac Enzymes:  Recent Labs Lab 12/21/16 0456 12/21/16 1743  TROPONINI <0.03 <0.03    BNP (last 3 results) No results for input(s): PROBNP in the last 8760 hours.  HbA1C: No results for input(s): HGBA1C in the last 72 hours.  CBG: No results for input(s): GLUCAP in the last 168  hours.  Lipid Profile: No results for input(s): CHOL, HDL, LDLCALC, TRIG, CHOLHDL, LDLDIRECT in the last 72 hours.  Thyroid Function Tests:  Recent Labs  12/21/16 0456  TSH 2.207    Anemia Panel:  Recent Labs  12/21/16 1743  VITAMINB12 306  FOLATE 28.5  FERRITIN 220  TIBC 263  IRON 209*  RETICCTPCT 2.8    Urine analysis: No results found for: COLORURINE, APPEARANCEUR, LABSPEC, PHURINE, GLUCOSEU, HGBUR, BILIRUBINUR, KETONESUR, PROTEINUR, UROBILINOGEN, NITRITE, LEUKOCYTESUR  Sepsis Labs: Lactic Acid, Venous No results found for: LATICACIDVEN  MICROBIOLOGY: No results found for this or any previous visit (from the past 240 hour(s)).  RADIOLOGY STUDIES/RESULTS: Dg Chest 2 View  Result Date: 12/20/2016 CLINICAL DATA:  Shortness of breath since a fall yesterday morning. EXAM: CHEST  2 VIEW COMPARISON:  None. FINDINGS: Cardiac pacemaker. Normal heart size and pulmonary vascularity. No focal airspace disease or consolidation in the lungs. Mild bronchiectasis and bronchial wall thickening  suggesting bronchitis. This may be acute or chronic. No blunting of costophrenic angles. No pneumothorax. Mediastinal contours appear intact. Calcification of the aorta. Degenerative changes in the shoulders and in the spine. Anterior compression of T12 with about 50% loss of height. Age is indeterminate. IMPRESSION: No focal consolidation or airspace disease. Bronchitic changes in the lungs may be acute or chronic. Anterior compression of T12 is age-indeterminate. Electronically Signed   By: Lucienne Capers M.D.   On: 12/20/2016 21:42     LOS: 2 days   Thurnell Lose, MD  [facility] Pager:[phone]  If 7PM-7AM, please contact night-coverage [email or web address] Password [email or web address] 12/23/2016, 8:45 AM

## 2016-12-23 NOTE — Evaluation (Signed)
Physical Therapy Evaluation Patient Details Name: Kimberly Horn MRN: [id number] DOB: 05-Dec-1929 Today's Date: 12/23/2016   History of Present Illness  Patient is a 83 y.o. female with history of atrial fibrillation (status post ablation), permanent pacemaker placement, history of venous thromboembolism on chronic anticoagulation with Eliquis, prior history of PRBC transfusion for anemia (last-3 weeks back)-admitted for evaluation of 3-4 episodes of syncope along with melanotic stools.  Clinical Impression  Patient presents with decreased independence with mobility due to deficits listed in PT problem list below (mainly deconditioning and weakness,) and will benefit from skilled PT in the acute setting to allow return home with family support and follow up HHPT.     Follow Up Recommendations Home health PT    Equipment Recommendations  None recommended by PT    Recommendations for Other Services       Precautions / Restrictions Precautions Precautions: Fall Precaution Comments: watch for orthostasis      Mobility  Bed Mobility               General bed mobility comments: up sitting EOB with daughter in law upon my entry  Transfers Overall transfer level: Needs assistance   Transfers: Sit to/from Stand Sit to Stand: Min guard         General transfer comment: for safety/balance  Ambulation/Gait Ambulation/Gait assistance: Min guard;Min assist Ambulation Distance (Feet): 200 Feet (with seated rest breaks x 2) Assistive device: None Gait Pattern/deviations: Step-through pattern;Decreased stride length;Drifts right/left     General Gait Details: LOB in hallway without device with min A recovery; seated rest right before and after going up stairs  Stairs Stairs: Yes Stairs assistance: Min assist Stair Management: One rail Right;Step to pattern;Forwards (and occasional HHA for descent) Number of Stairs: 3 (x 2) General stair comments: assist for  balance/safety  Wheelchair Mobility    Modified Rankin (Stroke Patients Only)       Balance Overall balance assessment: Needs assistance   Sitting balance-Leahy Scale: Good       Standing balance-Leahy Scale: Fair Standing balance comment: can stand static no UE support, but imbalance evident with mobility                             Pertinent Vitals/Pain Pain Assessment: Faces Faces Pain Scale: Hurts little more Pain Location: chronic back pain with ambulation Pain Descriptors / Indicators: Aching Pain Intervention(s): Monitored during session;Limited activity within patient's tolerance;Repositioned    Home Living Family/patient expects to be discharged to:: Private residence Living Arrangements: Children Available Help at Discharge: Family Type of Home: House Home Access: Stairs to enter Entrance Stairs-Rails: None Entrance Stairs-Number of Steps: 1 Home Layout: Two level;Able to live on main level with bedroom/bathroom Home Equipment: Kasandra Knudsen - single point;Shower seat;Walker - 2 wheels      Prior Function Level of Independence: Independent with assistive device(s)         Comments: used a cane PTA     Hand Dominance   Dominant Hand: Right    Extremity/Trunk Assessment        Lower Extremity Assessment Lower Extremity Assessment: Generalized weakness    Cervical / Trunk Assessment Cervical / Trunk Assessment: Kyphotic  Communication   Communication: HOH  Cognition Arousal/Alertness: Awake/alert Behavior During Therapy: WFL for tasks assessed/performed Overall Cognitive Status: Impaired/Different from baseline                 General Comments: admits to not thinking as  clearly as prior to syncope, but oriented x 4    General Comments General comments (skin integrity, edema, etc.): daughter in law present and relates equipment pt has at home as well as having assist available.    Exercises     Assessment/Plan    PT  Assessment Patient needs continued PT services  PT Problem List Decreased strength;Decreased balance;Decreased knowledge of use of DME;Decreased mobility;Decreased safety awareness       PT Treatment Interventions DME instruction;Therapeutic activities;Therapeutic exercise;Patient/family education;Gait training;Stair training;Balance training;Functional mobility training    PT Goals (Current goals can be found in the Care Plan section)  Acute Rehab PT Goals Patient Stated Goal: To return to independent PT Goal Formulation: With patient/family Time For Goal Achievement: 12/30/16 Potential to Achieve Goals: Good    Frequency Min 3X/week   Barriers to discharge        Co-evaluation               End of Session Equipment Utilized During Treatment: Gait belt;Oxygen Activity Tolerance: Patient tolerated treatment well Patient left: in chair;with call bell/phone within reach;with family/visitor present   PT Visit Diagnosis: Unsteadiness on feet (R26.81);Other abnormalities of gait and mobility (R26.89)         Time: [id number] PT Time Calculation (min) (ACUTE ONLY): 36 min   Charges:   PT Evaluation $PT Eval Moderate Complexity: 1 Procedure PT Treatments $Gait Training: 8-22 mins   PT G Codes:         Reginia Naas 01/06/17, 3:33 PM  Magda Kiel, [place] Jan 06, 2017

## 2016-12-23 NOTE — Progress Notes (Signed)
[facility] Progress Note  Chief Complaint: melena and anemia  Subjective  History:  No reports of melena or BRBPR Patient awakens easily and does not complain of abdominal pain. Hungry. Appears to have rec'd one unit of PRBCs yesterday ROS: Cardiovascular:  no chest pain Respiratory: no dyspnea  Objective:  Med list reviewed  Vital signs in last 24 hrs: Vitals:   12/22/16 2130 12/23/16 0435  BP: (!) 139/57 (!) 116/54  Pulse: 79 74  Resp: 16 16  Temp: 98.9 F (37.2 C) 98.1 F (36.7 C)    Physical Exam  Poor muscle mass  HEENT: sclera anicteric, oral mucosa moist without lesions  Neck: supple, no thyromegaly, JVD or lymphadenopathy  Cardiac: RRR without murmurs, S1S2 heard, no peripheral edema  Pulm: clear to auscultation bilaterally, normal RR and effort noted  Abdomen: soft, no tenderness, with active bowel sounds. No guarding or palpable hepatosplenomegaly  Skin; warm and dry, no jaundice or rash  Recent Labs:   Recent Labs Lab 12/20/16 2111  12/22/16 0523 12/22/16 1808 12/23/16 0527  WBC 3.9*  --  2.9*  --  3.5*  HGB 8.8*  < > 7.9* 9.8* 9.1*  HCT 27.0*  < > 24.0* 29.8* 27.3*  PLT 240  --  178  --  177  < > = values in this interval not displayed.  Recent Labs Lab 12/20/16 2112  12/23/16 0527  NA 138  < > 142  K 3.7  < > 3.9  CL 104  < > 109  CO2 27  < > 28  BUN 15  < > 7  ALBUMIN 3.3*  --   --   ALKPHOS 51  --   --   ALT 19  --   --   AST 18  --   --   GLUCOSE 145*  < > 94  < > = values in this interval not displayed. No results for input(s): INR in the last 168 hours.   @ASSESSMENTPLANBEGIN @ Assessment:  Melena Anemia of acute GI blood loss Small bowel vascular lesion bleeding, treated endoscopically. A fib with chronic OAC which is now discontinued (probably indefinitely)  She is stable and does not appear to have on going GI bleeding.  Hgb equilibrating.  Plan: Agree with discontinuation of [place]is - risks appear to outweigh  expected benefits in this patient. I have written a diet order Hgb tonight and tomorrow AM. If Hgb stable tomorrow , I feel she can go home from a GI perspective for as-needed outpatient follow up with Dr Silverio Decamp.   Nelida Meuse III Pager [phone] Mon-Fri 8a-5p [phone] after 5p, weekends, holidays

## 2016-12-24 LAB — TYPE AND SCREEN
ABO/RH(D): B POS
ANTIBODY SCREEN: NEGATIVE
UNIT DIVISION: 0
Unit division: 0
Unit division: 0

## 2016-12-24 LAB — HEMOGLOBIN AND HEMATOCRIT, BLOOD
HCT: 26.8 % — ABNORMAL LOW (ref 36.0–46.0)
HCT: 28.2 % — ABNORMAL LOW (ref 36.0–46.0)
Hemoglobin: 8.9 g/dL — ABNORMAL LOW (ref 12.0–15.0)
Hemoglobin: 9.3 g/dL — ABNORMAL LOW (ref 12.0–15.0)

## 2016-12-24 MED ORDER — PANTOPRAZOLE SODIUM 40 MG PO TBEC: 40.0000 mg | DELAYED_RELEASE_TABLET | Freq: Every day | ORAL | Status: DC

## 2016-12-24 MED ORDER — OMEPRAZOLE 40 MG PO CPDR
40.0000 mg | DELAYED_RELEASE_CAPSULE | Freq: Two times a day (BID) | ORAL | 2 refills | Status: DC
Start: 2016-12-24 — End: 2017-09-15

## 2016-12-24 NOTE — Care Management Note (Signed)
Case Management Note  Patient Details  Name: Kimberly Horn MRN: [id number] Date of Birth: Nov 30, 1929  Subjective/Objective:            Patient with history of atrial fibrillation (status post ablation), permanent pacemaker placement, history of venous thromboembolism on chronic anticoagulation with Eliquis, prior history of PRBC transfusion for anemia (last-3 weeks back)-admitted for evaluation of 3-4 episodes of syncope along with melanotic. PTA independent with ADL's . DME: oxygen (pt can't remember provider) but provider is in [place].      Trinna Enzor [address] [place] [id number] [phone]     PCP:  Dr. Donnamarie Poag [place], [place])   Action/Plan: Plan is d/c to home with home health services(PT,RN). Plan is to live with son @ d/c, [place], [place] [id number].  Expected Discharge Date:  12/24/16               Expected Discharge Plan:  [place]  In-House Referral:     Discharge planning Services     Post Acute Care Choice:    Choice offered to:  Patient  DME Arranged:    DME Agency:     HH Arranged:   PT,RN [place] made referral with Butch Penny @ [phone]  Status of Service:     If discussed at Long Length of Stay Meetings, dates discussed:    Additional Comments:  Sharin Mons, RN 12/24/2016, 2:07 PM

## 2016-12-24 NOTE — Discharge Instructions (Signed)
Follow with Primary MD in 3  days   Get CBC, CMP, 2 view Chest X ray checked  by Primary MD or SNF MD in 3 days ( we routinely change or add medications that can affect your baseline labs and fluid status, therefore we recommend that you get the mentioned basic workup next visit with your PCP, your PCP may decide not to get them or add new tests based on their clinical decision)  Activity: As tolerated with Full fall precautions use walker/cane & assistance as needed  Disposition Home    Diet: Heart Healthy  .  For Heart failure patients - Check your Weight same time everyday, if you gain over 2 pounds, or you develop in leg swelling, experience more shortness of breath or chest pain, call your Primary MD immediately. Follow Cardiac Low Salt Diet and 1.5 lit/day fluid restriction.  On your next visit with your primary care physician please Get Medicines reviewed and adjusted.  Please request your Prim.MD to go over all Hospital Tests and Procedure/Radiological results at the follow up, please get all Hospital records sent to your Prim MD by signing hospital release before you go home.  If you experience worsening of your admission symptoms, develop shortness of breath, life threatening emergency, suicidal or homicidal thoughts you must seek medical attention immediately by calling 911 or calling your MD immediately  if symptoms less severe.  You Must read complete instructions/literature along with all the possible adverse reactions/side effects for all the Medicines you take and that have been prescribed to you. Take any new Medicines after you have completely understood and accpet all the possible adverse reactions/side effects.   Do not drive, operate heavy machinery, perform activities at heights, swimming or participation in water activities or provide baby sitting services if your were admitted for syncope or siezures until you have seen by Primary MD or a Neurologist and advised to do so  again.  Do not drive when taking Pain medications.    Do not take more than prescribed Pain, Sleep and Anxiety Medications  Special Instructions: If you have smoked or chewed Tobacco  in the last 2 yrs please stop smoking, stop any regular Alcohol  and or any Recreational drug use.  Wear Seat belts while driving.   Please note  You were cared for by a hospitalist during your hospital stay. If you have any questions about your discharge medications or the care you received while you were in the hospital after you are discharged, you can call the unit and asked to speak with the hospitalist on call if the hospitalist that took care of you is not available. Once you are discharged, your primary care physician will handle any further medical issues. Please note that NO REFILLS for any discharge medications will be authorized once you are discharged, as it is imperative that you return to your primary care physician (or establish a relationship with a primary care physician if you do not have one) for your aftercare needs so that they can reassess your need for medications and monitor your lab values.

## 2016-12-24 NOTE — Discharge Summary (Addendum)
Kimberly Horn [id number] DOB: 1930-01-18 DOA: 12/20/2016  PCP: Pcp Not In System  Admit date: 12/20/2016  Discharge date: 12/24/2016  Admitted From: Home  Disposition:  Home   Recommendations for Outpatient Follow-up:   Follow up with PCP in 1-2 weeks  PCP Please obtain BMP/CBC, 2 view CXR in 1week,  (see Discharge instructions)   PCP Please follow up on the following pending results: None   Home Health: None   Equipment/Devices: None  Consultations: GI Discharge Condition: Stable   CODE STATUS: Full   Diet Recommendation:  Heart Healthy    Chief Complaint  Patient presents with  . Loss of Consciousness  . Hypotension     Brief history of present illness from the day of admission and additional interim summary    Patient is a 83 y.o. female with history of atrial fibrillation (status post ablation), permanent pacemaker placement, history of venous thromboembolism on chronic anticoagulation with Eliquis, prior history of PRBC transfusion for anemia (last-3 weeks back)-admitted for evaluation of 3-4 episodes of syncope along with melanotic stools.                                                                 Hospital Course    Syncope: Likely secondary to volume depletion/GI bleeding/orthostatic mechanism-her initial blood pressure in the ED was 87/45. EKG/telemetry showed a paced rhythm. With IV fluids, packed RBC transfusion blood pressure has stabilized and she is now symptom-free ambulating without any problems.  Upper GI bleed with acute blood loss anemia: Continue with IV PPI twice a day, she was transfused 1 unit of PRBC. GI was consulted, she underwent EGD which showed Dieulafoy lesion in the duodenum which was clipped, she has remained stable since then. DC'd Eliquis, risks and benefits discussed  with patient and daughter [name] who is a Engineer, drilling. They agree with the plan of discontinuing anticoagulation at this time. Request her PCP to recheck H&H within a week.  Transient hypotension: Suspect secondary to above, resolved with supportive measures.  History of prior venous thromboembolism: 3 of 2 provoked DVTs in the past per daughter who is a physician, as dictated above discontinuing anticoagulation due to recurrent GI bleed. Patient received her third transfusion in 9 months..  History of atrial fibrillation/permanent pacemaker placement: Per family-patient has undergone ablation in the past-and required pacemaker placement sometime in 2016. She had [place] vasc 2 score of at least 3. Anticoagulation stopped. Patient understands the risks and benefits and agrees with the plan, daughter who is a physician understands the risks and benefits and agrees with the plan. Follow with PCP within a week along with primary cardiologist in 1-2 weeks.  Essential hypertension: Continue home regimen as before.  Hypothyroidism: Continue Synthroid   Discharge diagnosis     Principal Problem:   Gastrointestinal hemorrhage  with melena Active Problems:   Syncope   Acute blood loss anemia   Atrial fibrillation (HCC)   Chronic anticoagulation   Peptic ulcer disease    Discharge instructions    Discharge Instructions    Diet - low sodium heart healthy    Complete by:  As directed    Discharge instructions    Complete by:  As directed    Follow with Primary MD in 3  days   Get CBC, CMP, 2 view Chest X ray checked  by Primary MD or SNF MD in 3 days ( we routinely change or add medications that can affect your baseline labs and fluid status, therefore we recommend that you get the mentioned basic workup next visit with your PCP, your PCP may decide not to get them or add new tests based on their clinical decision)  Activity: As tolerated with Full fall precautions use walker/cane &  assistance as needed  Disposition Home    Diet: Heart Healthy  .  For Heart failure patients - Check your Weight same time everyday, if you gain over 2 pounds, or you develop in leg swelling, experience more shortness of breath or chest pain, call your Primary MD immediately. Follow Cardiac Low Salt Diet and 1.5 lit/day fluid restriction.  On your next visit with your primary care physician please Get Medicines reviewed and adjusted.  Please request your Prim.MD to go over all Hospital Tests and Procedure/Radiological results at the follow up, please get all Hospital records sent to your Prim MD by signing hospital release before you go home.  If you experience worsening of your admission symptoms, develop shortness of breath, life threatening emergency, suicidal or homicidal thoughts you must seek medical attention immediately by calling 911 or calling your MD immediately  if symptoms less severe.  You Must read complete instructions/literature along with all the possible adverse reactions/side effects for all the Medicines you take and that have been prescribed to you. Take any new Medicines after you have completely understood and accpet all the possible adverse reactions/side effects.   Do not drive, operate heavy machinery, perform activities at heights, swimming or participation in water activities or provide baby sitting services if your were admitted for syncope or siezures until you have seen by Primary MD or a Neurologist and advised to do so again.  Do not drive when taking Pain medications.    Do not take more than prescribed Pain, Sleep and Anxiety Medications  Special Instructions: If you have smoked or chewed Tobacco  in the last 2 yrs please stop smoking, stop any regular Alcohol  and or any Recreational drug use.  Wear Seat belts while driving.   Please note  You were cared for by a hospitalist during your hospital stay. If you have any questions about your discharge  medications or the care you received while you were in the hospital after you are discharged, you can call the unit and asked to speak with the hospitalist on call if the hospitalist that took care of you is not available. Once you are discharged, your primary care physician will handle any further medical issues. Please note that NO REFILLS for any discharge medications will be authorized once you are discharged, as it is imperative that you return to your primary care physician (or establish a relationship with a primary care physician if you do not have one) for your aftercare needs so that they can reassess your need for medications and monitor your lab  values.      Discharge Medications   Allergies as of 12/24/2016      Reactions   Demerol [meperidine] Other (See Comments)   INTENSIFIED FEELINGS   Hydroxyzine Other (See Comments)   INTENSIFIED FEELINGS   Talwin [pentazocine] Other (See Comments)   INTENSIFIED FEELINGS      Medication List    STOP taking these medications   ELIQUIS 5 MG Tabs tablet Generic drug:  apixaban     TAKE these medications   DULoxetine 30 MG capsule Commonly known as:  CYMBALTA Take 30 mg by mouth 2 (two) times daily.   furosemide 20 MG tablet Commonly known as:  LASIX Take 20 mg by mouth daily.   levothyroxine 100 MCG tablet Commonly known as:  SYNTHROID, LEVOTHROID Take 100 mcg by mouth every evening.   multivitamin with minerals Tabs tablet Take 1 tablet by mouth daily.   omeprazole 40 MG capsule Commonly known as:  PRILOSEC Take 1 capsule (40 mg total) by mouth 2 (two) times daily.   potassium chloride SA 20 MEQ tablet Commonly known as:  K-DUR,KLOR-CON Take 20 mEq by mouth daily.   sucralfate 1 GM/10ML suspension Commonly known as:  CARAFATE Take 1 g by mouth 2 (two) times daily.       Follow-up Information    Your PCP. Schedule an appointment as soon as possible for a visit in 3 day(s).        Harl Bowie, MD. Schedule  an appointment as soon as possible for a visit on 01/29/2017.   Specialty:  Gastroenterology Why:  Appointment is on 01/29/17 at 9:45 Contact information: [place] [id number] [phone]           Major procedures and Radiology Reports - PLEASE review detailed and final reports thoroughly  -     EGD - Dieulafoy lesion in the duodenum.   Dg Chest 2 View  Result Date: 12/20/2016 CLINICAL DATA:  Shortness of breath since a fall yesterday morning. EXAM: CHEST  2 VIEW COMPARISON:  None. FINDINGS: Cardiac pacemaker. Normal heart size and pulmonary vascularity. No focal airspace disease or consolidation in the lungs. Mild bronchiectasis and bronchial wall thickening suggesting bronchitis. This may be acute or chronic. No blunting of costophrenic angles. No pneumothorax. Mediastinal contours appear intact. Calcification of the aorta. Degenerative changes in the shoulders and in the spine. Anterior compression of T12 with about 50% loss of height. Age is indeterminate. IMPRESSION: No focal consolidation or airspace disease. Bronchitic changes in the lungs may be acute or chronic. Anterior compression of T12 is age-indeterminate. Electronically Signed   By: Lucienne Capers M.D.   On: 12/20/2016 21:42    Micro Results     No results found for this or any previous visit (from the past 240 hour(s)).  Today   Subjective    Kimberly Horn today has no headache,no chest abdominal pain,no new weakness tingling or numbness, feels much better wants to go home today.     Objective   Blood pressure (!) 110/53, pulse 78, temperature 97.8 F (36.6 C), temperature source Oral, resp. rate 18, height 5\' 4"  (1.626 m), weight 62.8 kg (138 lb 8 oz), SpO2 100 %.   Intake/Output Summary (Last 24 hours) at 12/24/16 1330 Last data filed at 12/23/16 2205  Gross per 24 hour  Intake                0 ml  Output  1000 ml  Net            -1000 ml    Exam Awake Alert, Oriented x  3, No new F.N deficits, Normal affect [place].AT,PERRAL Supple Neck,No JVD, No cervical lymphadenopathy appriciated.  Symmetrical Chest wall movement, Good air movement bilaterally, CTAB RRR,No Gallops,Rubs or new Murmurs, No Parasternal Heave +ve B.Sounds, Abd Soft, Non tender, No organomegaly appriciated, No rebound -guarding or rigidity. No Cyanosis, Clubbing or edema, No new Rash or bruise   Data Review   CBC w Diff:  Lab Results  Component Value Date   WBC 3.5 (L) 12/23/2016   HGB 9.3 (L) 12/24/2016   HCT 28.2 (L) 12/24/2016   PLT 177 12/23/2016    CMP:  Lab Results  Component Value Date   NA 142 12/23/2016   K 3.9 12/23/2016   CL 109 12/23/2016   CO2 28 12/23/2016   BUN 7 12/23/2016   CREATININE 0.78 12/23/2016   PROT 6.1 (L) 12/20/2016   ALBUMIN 3.3 (L) 12/20/2016   BILITOT 0.5 12/20/2016   ALKPHOS 51 12/20/2016   AST 18 12/20/2016   ALT 19 12/20/2016  .   Total Time in preparing paper work, data evaluation and todays exam - 35 minutes  Thurnell Lose M.D on 12/24/2016 at 1:30 PM  [facility]   Office  [phone]

## 2016-12-24 NOTE — Progress Notes (Signed)
Family and provider requested patient receive a shower. Patient refused because I had helped her wash up at the sink this morning.

## 2016-12-24 NOTE — Progress Notes (Signed)
          Daily Rounding Note  12/24/2016, 10:02 AM  LOS: 3 days   SUBJECTIVE:   Chief complaint:  Dark stool and weakness.  Stool yesterday was dark, no weakness.  Feels well     OBJECTIVE:         Vital signs in last 24 hours:    Temp:  [97.8 F (36.6 C)] 97.8 F (36.6 C) (02/20 0554) Pulse Rate:  [78] 78 (02/20 0554) Resp:  [18] 18 (02/20 0554) BP: (110)/(53) 110/53 (02/20 0554) SpO2:  [100 %] 100 % (02/20 0554) Last BM Date: 12/23/16 Filed Weights   12/21/16 0233 12/22/16 0840  Weight: 62.8 kg (138 lb 8 oz) 62.8 kg (138 lb 8 oz)   General: pleasant, looks well.    Heart: RRR Chest: Clear bil Abdomen: NT,ND.  Active BS  Extremities: no CCE Neuro/Psych:  Oriented x 3.  Fully alert.   No gross deficits.   Intake/Output from previous day: 02/19 0701 - 02/20 0700 In: 360 [P.O.:360] Out: 1000 [Urine:1000]  Intake/Output this shift: No intake/output data recorded.  Lab Results:  Recent Labs  12/22/16 0523  12/23/16 0527 12/23/16 0942 12/23/16 1838 12/24/16 0801  WBC 2.9*  --  3.5*  --   --   --   HGB 7.9*  < > 9.1* 9.4* 9.4* 8.9*  HCT 24.0*  < > 27.3* 28.0* 28.8* 26.8*  PLT 178  --  177  --   --   --   < > = values in this interval not displayed. BMET  Recent Labs  12/22/16 0523 12/23/16 0527  NA 140 142  K 3.6 3.9  CL 108 109  CO2 25 28  GLUCOSE 93 94  BUN 12 7  CREATININE 0.79 0.78  CALCIUM 8.2* 8.1*   LFT No results for input(s): PROT, ALBUMIN, AST, ALT, ALKPHOS, BILITOT, BILIDIR, IBILI in the last 72 hours. PT/INR No results for input(s): LABPROT, INR in the last 72 hours. Hepatitis Panel No results for input(s): HEPBSAG, HCVAB, HEPAIGM, HEPBIGM in the last 72 hours.  Studies/Results: No results found.  Scheduled Meds: . DULoxetine  30 mg Oral BID  . levothyroxine  100 mcg Oral QPM  . multivitamin with minerals  1 tablet Oral Daily  . pantoprazole  40 mg Oral BID AC  . potassium  chloride SA  20 mEq Oral Daily  . sodium chloride flush  3 mL Intravenous Q12H  . sucralfate  1 g Oral BID WC   Continuous Infusions: PRN Meds:.albuterol, diphenhydrAMINE, [DISCONTINUED] ondansetron **OR** ondansetron (ZOFRAN) IV  ASSESMENT:   * Dark stools for 3 weeks PTA.    EGD 12/22/16: superficial mucosal tear (occurred prior to endoscopy) at gastric cardia.  Duodenal Dieulafoy lesion lasered and clipped.   *  Acute blood loss on top of chronic anemia.  Transfusions PRBCs summer 2017, and mid to late 12/2016.  Hgb down 0.5 gm c/w 2/19.    *  Afib, Hx DVT/PE summer 2017 on Eliquis which is now on hold.    PLAN   *  Has ROV with GI, Dr Delane Ginger on3/28 at 9:45 AM.  Will get CBC within 2 weeks at PMD office.  Once daily PPI.  Stop carafate at discharge.     Kimberly Horn  12/24/2016, 10:02 AM Pager: [phone]

## 2016-12-24 NOTE — Progress Notes (Signed)
Kimberly Horn to be D/C'd Home per MD order.  Discussed with the patient and all questions fully answered.  VSS, Skin clean, dry and intact without evidence of skin break down, no evidence of skin tears noted. IV catheter discontinued intact. Site without signs and symptoms of complications. Dressing and pressure applied.  An After Visit Summary was printed and given to the patient. Patient received prescription.  D/c education completed with patient/family including follow up instructions, medication list, d/c activities limitations if indicated, with other d/c instructions as indicated by MD - patient able to verbalize understanding, all questions fully answered.   Patient instructed to return to ED, call 911, or call MD for any changes in condition.   Patient escorted via [place], and D/C home via private auto.  Kimberly Horn 12/24/2016 7:53 PM

## 2017-01-22 DIAGNOSIS — G4700 Insomnia, unspecified: Secondary | ICD-10-CM | POA: Diagnosis not present

## 2017-01-22 DIAGNOSIS — Z8249 Family history of ischemic heart disease and other diseases of the circulatory system: Secondary | ICD-10-CM | POA: Diagnosis not present

## 2017-01-22 DIAGNOSIS — Z950 Presence of cardiac pacemaker: Secondary | ICD-10-CM | POA: Diagnosis not present

## 2017-01-22 DIAGNOSIS — K219 Gastro-esophageal reflux disease without esophagitis: Secondary | ICD-10-CM | POA: Diagnosis not present

## 2017-01-22 DIAGNOSIS — J849 Interstitial pulmonary disease, unspecified: Secondary | ICD-10-CM | POA: Diagnosis not present

## 2017-01-22 DIAGNOSIS — Z8719 Personal history of other diseases of the digestive system: Secondary | ICD-10-CM | POA: Diagnosis not present

## 2017-01-22 DIAGNOSIS — E784 Other hyperlipidemia: Secondary | ICD-10-CM | POA: Diagnosis not present

## 2017-01-22 DIAGNOSIS — E039 Hypothyroidism, unspecified: Secondary | ICD-10-CM | POA: Diagnosis not present

## 2017-01-22 DIAGNOSIS — I10 Essential (primary) hypertension: Secondary | ICD-10-CM | POA: Diagnosis not present

## 2017-01-23 DIAGNOSIS — D7589 Other specified diseases of blood and blood-forming organs: Secondary | ICD-10-CM | POA: Diagnosis not present

## 2017-01-23 DIAGNOSIS — Z8719 Personal history of other diseases of the digestive system: Secondary | ICD-10-CM | POA: Diagnosis not present

## 2017-01-23 DIAGNOSIS — E538 Deficiency of other specified B group vitamins: Secondary | ICD-10-CM | POA: Diagnosis not present

## 2017-01-23 DIAGNOSIS — E039 Hypothyroidism, unspecified: Secondary | ICD-10-CM | POA: Diagnosis not present

## 2017-01-23 DIAGNOSIS — J84112 Idiopathic pulmonary fibrosis: Secondary | ICD-10-CM | POA: Diagnosis not present

## 2017-01-23 DIAGNOSIS — D649 Anemia, unspecified: Secondary | ICD-10-CM | POA: Diagnosis not present

## 2017-01-27 DIAGNOSIS — D649 Anemia, unspecified: Secondary | ICD-10-CM | POA: Diagnosis not present

## 2017-01-27 DIAGNOSIS — E538 Deficiency of other specified B group vitamins: Secondary | ICD-10-CM | POA: Diagnosis not present

## 2017-01-27 DIAGNOSIS — E039 Hypothyroidism, unspecified: Secondary | ICD-10-CM | POA: Diagnosis not present

## 2017-01-27 DIAGNOSIS — D7589 Other specified diseases of blood and blood-forming organs: Secondary | ICD-10-CM | POA: Diagnosis not present

## 2017-01-27 DIAGNOSIS — Z8719 Personal history of other diseases of the digestive system: Secondary | ICD-10-CM | POA: Diagnosis not present

## 2017-01-27 DIAGNOSIS — J84112 Idiopathic pulmonary fibrosis: Secondary | ICD-10-CM | POA: Diagnosis not present

## 2017-01-29 ENCOUNTER — Encounter (INDEPENDENT_AMBULATORY_CARE_PROVIDER_SITE_OTHER): Payer: Self-pay

## 2017-01-29 ENCOUNTER — Encounter: Payer: Self-pay | Admitting: Gastroenterology

## 2017-01-29 ENCOUNTER — Ambulatory Visit (INDEPENDENT_AMBULATORY_CARE_PROVIDER_SITE_OTHER): Payer: Medicare Other | Admitting: Gastroenterology

## 2017-01-29 VITALS — BP 102/54 | Ht 63.0 in | Wt 143.5 lb

## 2017-01-29 DIAGNOSIS — D649 Anemia, unspecified: Secondary | ICD-10-CM

## 2017-01-29 DIAGNOSIS — Z8719 Personal history of other diseases of the digestive system: Secondary | ICD-10-CM | POA: Diagnosis not present

## 2017-01-29 NOTE — Progress Notes (Signed)
Kimberly Horn    [id number]    Jan 04, 1930  Primary Care Physician:Pcp Not In System  Referring Physician: No referring provider defined for this encounter.  Chief complaint:  Anemia due to upper GI blood loss  HPI: 50 yr F with h/o afib, congestive heart failure, pulmonary fibrosis on continuous home O2, here for follow up visit after recent hospitalization with severe anemia in the setting of upper GI blood loss. EGD findings suggestive of Diuelafoy lesion in duodenum treated with cautery and hemostatic clips. She was also taken off anticoagulation given multiple admissions with significant GI bleed and severe anemia.  Post hospitalization Hgb has improved to 10, she has followed with Hematologist in [place], office consult note is not available during this visit. She is not taking PO iron, hematologist was considering Iron infusion per patient. She has no GI complaints. Denies any nausea, vomiting, abdominal pain, melena or bright red blood per rectum    Outpatient Encounter Prescriptions as of 01/29/2017  Medication Sig  . DULoxetine (CYMBALTA) 30 MG capsule Take 30 mg by mouth 2 (two) times daily.  . furosemide (LASIX) 20 MG tablet Take 20 mg by mouth daily.  Marland Kitchen HYDROcodone-acetaminophen (NORCO/VICODIN) 5-325 MG tablet Take 1 tablet by mouth every 6 (six) hours as needed for moderate pain. Only takes as needed  . levothyroxine (SYNTHROID, LEVOTHROID) 100 MCG tablet Take 100 mcg by mouth every evening.  . Multiple Vitamin (MULTIVITAMIN WITH MINERALS) TABS tablet Take 1 tablet by mouth daily.  Marland Kitchen omeprazole (PRILOSEC) 40 MG capsule Take 1 capsule (40 mg total) by mouth 2 (two) times daily.  . potassium chloride SA (K-DUR,KLOR-CON) 20 MEQ tablet Take 20 mEq by mouth daily.  . sucralfate (CARAFATE) 1 GM/10ML suspension Take 1 g by mouth 2 (two) times daily.   No facility-administered encounter medications on file as of 01/29/2017.     Allergies as of 01/29/2017 - Review  Complete 01/29/2017  Allergen Reaction Noted  . Demerol [meperidine] Other (See Comments) 12/20/2016  . Hydroxyzine Other (See Comments) 12/20/2016  . Talwin [pentazocine] Other (See Comments) 12/20/2016    Past Medical History:  Diagnosis Date  . Atrial fibrillation ([place])   . CHF (congestive heart failure) ([place])   . DVT (deep venous thrombosis) ([place])   . GI bleed   . Hypothyroidism   . Pacemaker   . Pulmonary embolism ([place])   . Pulmonary fibrosis ([place])     Past Surgical History:  Procedure Laterality Date  . ABLATION    . ESOPHAGOGASTRODUODENOSCOPY (EGD) WITH PROPOFOL N/A 12/22/2016   Procedure: ESOPHAGOGASTRODUODENOSCOPY (EGD) WITH PROPOFOL;  Surgeon: Mauri Pole, MD;  Location: [facility];  Service: Endoscopy;  Laterality: N/A;  . PACEMAKER INSERTION      History reviewed. No pertinent family history.  Social History   Social History  . Marital status: Widowed    Spouse name: N/A  . Number of children: 5  . Years of education: N/A   Occupational History  . Not on file.   Social History Main Topics  . Smoking status: Never Smoker  . Smokeless tobacco: Never Used  . Alcohol use No  . Drug use: No  . Sexual activity: Not on file   Other Topics Concern  . Not on file   Social History Narrative  . No narrative on file      Review of systems: Review of Systems  Constitutional: Negative for fever and chills.  HENT: Positive for difficulty  hearing   Eyes: Negative for blurred vision.  Respiratory: Positive for cough, shortness of breath and wheezing.  On Continuous Home O2 Cardiovascular: Negative for chest pain and positive for Afib, palpitations.  Gastrointestinal: as per HPI Genitourinary: Negative for dysuria, urgency, frequency and hematuria.  Musculoskeletal: Positive for myalgias, back pain and joint pain.  Skin: Negative for itching and rash.  Neurological: Negative for dizziness, tremors, focal weakness, seizures and loss of  consciousness.  Endo/Heme/Allergies: Positive for seasonal allergies.  Psychiatric/Behavioral: Negative for depression, suicidal ideas and hallucinations.  All other systems reviewed and are negative.   Physical Exam: Vitals:   01/29/17 0958  BP: (!) 102/54   Body mass index is 25.42 kg/m. Gen:      No acute distress, wheel chair on continuous O2 HEENT:  EOMI, sclera anicteric Neck:     No masses; no thyromegaly Lungs:    Clear to auscultation bilaterally with basal crackles; normal respiratory effort CV:         Regular rate and rhythm; no murmurs Abd:      + bowel sounds; soft, non-tender; no palpable masses, no distension Ext:    No edema; adequate peripheral perfusion Skin:      Warm and dry; no rash Neuro: alert and oriented x 3 Psych: normal mood and affect  Data Reviewed:  Reviewed labs, radiology imaging, old records and pertinent past GI work up   Assessment and Plan/Recommendations:  63 yr F with h/o afib, CHF, Pulmonary fibrosis on continuous oxygen here for follow up visit after recent hospitalization with severe anemia and upper GI bleed secondary to Dieulafoy lesion treated during egd. Hgb has improved No Melena or evidence of ongoing GI bleed Advised patient to have serial monitoring of Hgb every 3 months PO iron Ferrous sulphate 325 mg BID if she is not going to get IV iron infusion She is planning to relocate to [place], advised her to establish with GI there and we can send her records to new physician   25 minutes was spent face-to-face with the patient. Greater than 50% of the time used for counseling as well as treatment plan and follow-up or iron deficiency anemia. She had multiple questions which were answered to her satisfaction  K. Denzil Magnuson , MD [phone] Mon-Fri 8a-5p [phone] after 5p, weekends, holidays  CC: No ref. provider found

## 2017-01-29 NOTE — Patient Instructions (Addendum)
Follow up as needed  We will obtain your labs from [place]

## 2017-02-10 ENCOUNTER — Encounter: Payer: Self-pay | Admitting: Internal Medicine

## 2017-02-10 DIAGNOSIS — Z5181 Encounter for therapeutic drug level monitoring: Secondary | ICD-10-CM | POA: Diagnosis not present

## 2017-02-10 DIAGNOSIS — M542 Cervicalgia: Secondary | ICD-10-CM | POA: Diagnosis not present

## 2017-02-10 DIAGNOSIS — J8410 Pulmonary fibrosis, unspecified: Secondary | ICD-10-CM | POA: Diagnosis not present

## 2017-02-10 DIAGNOSIS — N3090 Cystitis, unspecified without hematuria: Secondary | ICD-10-CM | POA: Diagnosis not present

## 2017-02-10 DIAGNOSIS — Z8711 Personal history of peptic ulcer disease: Secondary | ICD-10-CM | POA: Diagnosis not present

## 2017-02-10 DIAGNOSIS — Z862 Personal history of diseases of the blood and blood-forming organs and certain disorders involving the immune mechanism: Secondary | ICD-10-CM | POA: Diagnosis not present

## 2017-02-10 DIAGNOSIS — M4852XA Collapsed vertebra, not elsewhere classified, cervical region, initial encounter for fracture: Secondary | ICD-10-CM | POA: Diagnosis not present

## 2017-02-10 DIAGNOSIS — E039 Hypothyroidism, unspecified: Secondary | ICD-10-CM | POA: Diagnosis not present

## 2017-02-10 DIAGNOSIS — M4312 Spondylolisthesis, cervical region: Secondary | ICD-10-CM | POA: Diagnosis not present

## 2017-02-10 DIAGNOSIS — M50322 Other cervical disc degeneration at C5-C6 level: Secondary | ICD-10-CM | POA: Diagnosis not present

## 2017-02-10 DIAGNOSIS — M5136 Other intervertebral disc degeneration, lumbar region: Secondary | ICD-10-CM | POA: Diagnosis not present

## 2017-02-10 DIAGNOSIS — M5126 Other intervertebral disc displacement, lumbar region: Secondary | ICD-10-CM | POA: Diagnosis not present

## 2017-02-10 DIAGNOSIS — M4316 Spondylolisthesis, lumbar region: Secondary | ICD-10-CM | POA: Diagnosis not present

## 2017-02-11 LAB — LIPID PANEL
Cholesterol: 174 (ref 0–200)
HDL: 37 (ref 35–70)
LDL CALC: 106
Triglycerides: 194 — AB (ref 40–160)

## 2017-02-18 DIAGNOSIS — J8410 Pulmonary fibrosis, unspecified: Secondary | ICD-10-CM | POA: Diagnosis not present

## 2017-02-18 DIAGNOSIS — E039 Hypothyroidism, unspecified: Secondary | ICD-10-CM | POA: Diagnosis not present

## 2017-02-18 DIAGNOSIS — Z8711 Personal history of peptic ulcer disease: Secondary | ICD-10-CM | POA: Diagnosis not present

## 2017-02-18 DIAGNOSIS — D649 Anemia, unspecified: Secondary | ICD-10-CM | POA: Diagnosis not present

## 2017-02-21 DIAGNOSIS — I480 Paroxysmal atrial fibrillation: Secondary | ICD-10-CM | POA: Diagnosis not present

## 2017-03-13 DIAGNOSIS — R0602 Shortness of breath: Secondary | ICD-10-CM | POA: Diagnosis not present

## 2017-03-13 DIAGNOSIS — R079 Chest pain, unspecified: Secondary | ICD-10-CM | POA: Diagnosis not present

## 2017-03-13 DIAGNOSIS — N289 Disorder of kidney and ureter, unspecified: Secondary | ICD-10-CM | POA: Diagnosis not present

## 2017-03-13 DIAGNOSIS — D649 Anemia, unspecified: Secondary | ICD-10-CM | POA: Diagnosis not present

## 2017-03-13 DIAGNOSIS — J8410 Pulmonary fibrosis, unspecified: Secondary | ICD-10-CM | POA: Diagnosis not present

## 2017-03-13 DIAGNOSIS — E538 Deficiency of other specified B group vitamins: Secondary | ICD-10-CM | POA: Diagnosis not present

## 2017-03-14 LAB — CBC AND DIFFERENTIAL
HCT: 26 — AB (ref 36–46)
HEMOGLOBIN: 8.8 — AB (ref 12.0–16.0)
Platelets: 266 (ref 150–399)

## 2017-03-14 LAB — HEPATIC FUNCTION PANEL
ALK PHOS: 55 (ref 25–125)
ALT: 12 (ref 7–35)
AST: 14 (ref 13–35)
BILIRUBIN, TOTAL: 0.5

## 2017-03-14 LAB — BASIC METABOLIC PANEL
BUN: 24 — AB (ref 4–21)
CREATININE: 1 (ref 0.5–1.1)
GLUCOSE: 95
POTASSIUM: 4.8 (ref 3.4–5.3)
SODIUM: 138 (ref 137–147)

## 2017-04-01 DIAGNOSIS — J8410 Pulmonary fibrosis, unspecified: Secondary | ICD-10-CM | POA: Diagnosis not present

## 2017-04-01 DIAGNOSIS — R0902 Hypoxemia: Secondary | ICD-10-CM | POA: Diagnosis not present

## 2017-04-01 DIAGNOSIS — G4700 Insomnia, unspecified: Secondary | ICD-10-CM | POA: Diagnosis not present

## 2017-04-01 DIAGNOSIS — K219 Gastro-esophageal reflux disease without esophagitis: Secondary | ICD-10-CM | POA: Diagnosis not present

## 2017-04-01 DIAGNOSIS — K257 Chronic gastric ulcer without hemorrhage or perforation: Secondary | ICD-10-CM | POA: Diagnosis not present

## 2017-04-01 DIAGNOSIS — Z9981 Dependence on supplemental oxygen: Secondary | ICD-10-CM | POA: Diagnosis not present

## 2017-04-01 DIAGNOSIS — R0602 Shortness of breath: Secondary | ICD-10-CM | POA: Diagnosis not present

## 2017-04-03 DIAGNOSIS — J8410 Pulmonary fibrosis, unspecified: Secondary | ICD-10-CM | POA: Diagnosis not present

## 2017-04-03 DIAGNOSIS — K259 Gastric ulcer, unspecified as acute or chronic, without hemorrhage or perforation: Secondary | ICD-10-CM | POA: Diagnosis not present

## 2017-04-03 DIAGNOSIS — K30 Functional dyspepsia: Secondary | ICD-10-CM | POA: Diagnosis not present

## 2017-04-03 DIAGNOSIS — K219 Gastro-esophageal reflux disease without esophagitis: Secondary | ICD-10-CM | POA: Diagnosis not present

## 2017-04-09 DIAGNOSIS — K3184 Gastroparesis: Secondary | ICD-10-CM | POA: Diagnosis not present

## 2017-04-09 DIAGNOSIS — K219 Gastro-esophageal reflux disease without esophagitis: Secondary | ICD-10-CM | POA: Diagnosis not present

## 2017-04-09 DIAGNOSIS — Z8711 Personal history of peptic ulcer disease: Secondary | ICD-10-CM | POA: Diagnosis not present

## 2017-04-09 DIAGNOSIS — R1314 Dysphagia, pharyngoesophageal phase: Secondary | ICD-10-CM | POA: Diagnosis not present

## 2017-04-18 DIAGNOSIS — M810 Age-related osteoporosis without current pathological fracture: Secondary | ICD-10-CM | POA: Diagnosis not present

## 2017-04-18 DIAGNOSIS — M8580 Other specified disorders of bone density and structure, unspecified site: Secondary | ICD-10-CM | POA: Diagnosis not present

## 2017-04-18 DIAGNOSIS — Z13820 Encounter for screening for osteoporosis: Secondary | ICD-10-CM | POA: Diagnosis not present

## 2017-04-18 LAB — HM DEXA SCAN

## 2017-05-06 DIAGNOSIS — J8410 Pulmonary fibrosis, unspecified: Secondary | ICD-10-CM | POA: Diagnosis not present

## 2017-05-06 DIAGNOSIS — E039 Hypothyroidism, unspecified: Secondary | ICD-10-CM | POA: Diagnosis not present

## 2017-05-06 DIAGNOSIS — D649 Anemia, unspecified: Secondary | ICD-10-CM | POA: Diagnosis not present

## 2017-05-06 DIAGNOSIS — Z8711 Personal history of peptic ulcer disease: Secondary | ICD-10-CM | POA: Diagnosis not present

## 2017-05-08 DIAGNOSIS — K219 Gastro-esophageal reflux disease without esophagitis: Secondary | ICD-10-CM | POA: Diagnosis not present

## 2017-05-08 DIAGNOSIS — K449 Diaphragmatic hernia without obstruction or gangrene: Secondary | ICD-10-CM | POA: Diagnosis not present

## 2017-05-08 DIAGNOSIS — K228 Other specified diseases of esophagus: Secondary | ICD-10-CM | POA: Diagnosis not present

## 2017-05-22 DIAGNOSIS — K3184 Gastroparesis: Secondary | ICD-10-CM | POA: Diagnosis not present

## 2017-05-22 DIAGNOSIS — K219 Gastro-esophageal reflux disease without esophagitis: Secondary | ICD-10-CM | POA: Diagnosis not present

## 2017-05-22 DIAGNOSIS — R1314 Dysphagia, pharyngoesophageal phase: Secondary | ICD-10-CM | POA: Diagnosis not present

## 2017-05-22 DIAGNOSIS — Z8711 Personal history of peptic ulcer disease: Secondary | ICD-10-CM | POA: Diagnosis not present

## 2017-05-26 DIAGNOSIS — Z950 Presence of cardiac pacemaker: Secondary | ICD-10-CM | POA: Diagnosis not present

## 2017-05-26 DIAGNOSIS — I4891 Unspecified atrial fibrillation: Secondary | ICD-10-CM | POA: Diagnosis not present

## 2017-05-26 DIAGNOSIS — I480 Paroxysmal atrial fibrillation: Secondary | ICD-10-CM | POA: Diagnosis not present

## 2017-05-26 DIAGNOSIS — Z45018 Encounter for adjustment and management of other part of cardiac pacemaker: Secondary | ICD-10-CM | POA: Diagnosis not present

## 2017-06-02 DIAGNOSIS — K219 Gastro-esophageal reflux disease without esophagitis: Secondary | ICD-10-CM | POA: Diagnosis not present

## 2017-06-02 DIAGNOSIS — N289 Disorder of kidney and ureter, unspecified: Secondary | ICD-10-CM | POA: Diagnosis not present

## 2017-06-02 DIAGNOSIS — E538 Deficiency of other specified B group vitamins: Secondary | ICD-10-CM | POA: Diagnosis not present

## 2017-06-02 DIAGNOSIS — R410 Disorientation, unspecified: Secondary | ICD-10-CM | POA: Diagnosis not present

## 2017-06-02 DIAGNOSIS — Z9981 Dependence on supplemental oxygen: Secondary | ICD-10-CM | POA: Diagnosis not present

## 2017-06-02 DIAGNOSIS — E039 Hypothyroidism, unspecified: Secondary | ICD-10-CM | POA: Diagnosis not present

## 2017-06-02 DIAGNOSIS — R296 Repeated falls: Secondary | ICD-10-CM | POA: Diagnosis not present

## 2017-06-02 DIAGNOSIS — G4700 Insomnia, unspecified: Secondary | ICD-10-CM | POA: Diagnosis not present

## 2017-06-02 DIAGNOSIS — J8410 Pulmonary fibrosis, unspecified: Secondary | ICD-10-CM | POA: Diagnosis not present

## 2017-06-02 DIAGNOSIS — Z8711 Personal history of peptic ulcer disease: Secondary | ICD-10-CM | POA: Diagnosis not present

## 2017-06-02 DIAGNOSIS — Z5181 Encounter for therapeutic drug level monitoring: Secondary | ICD-10-CM | POA: Diagnosis not present

## 2017-06-02 DIAGNOSIS — D649 Anemia, unspecified: Secondary | ICD-10-CM | POA: Diagnosis not present

## 2017-06-02 DIAGNOSIS — R8299 Other abnormal findings in urine: Secondary | ICD-10-CM | POA: Diagnosis not present

## 2017-06-03 DIAGNOSIS — E538 Deficiency of other specified B group vitamins: Secondary | ICD-10-CM | POA: Diagnosis not present

## 2017-06-03 DIAGNOSIS — D649 Anemia, unspecified: Secondary | ICD-10-CM | POA: Diagnosis not present

## 2017-06-03 LAB — BASIC METABOLIC PANEL
BUN: 24 — AB (ref 4–21)
CREATININE: 1.1 (ref 0.5–1.1)
Glucose: 102
POTASSIUM: 4.8 (ref 3.4–5.3)
Sodium: 136 — AB (ref 137–147)

## 2017-06-03 LAB — CBC AND DIFFERENTIAL
HEMATOCRIT: 18 — AB (ref 36–46)
Hemoglobin: 5.9 — AB (ref 12.0–16.0)
PLATELETS: 211 (ref 150–399)

## 2017-06-03 LAB — HEPATIC FUNCTION PANEL
ALK PHOS: 56 (ref 25–125)
ALT: 21 (ref 7–35)
AST: 15 (ref 13–35)
BILIRUBIN, TOTAL: 0.6

## 2017-06-03 LAB — LIPID PANEL: LDl/HDL Ratio: 4.2

## 2017-06-04 DIAGNOSIS — I5033 Acute on chronic diastolic (congestive) heart failure: Secondary | ICD-10-CM | POA: Diagnosis not present

## 2017-06-04 DIAGNOSIS — D464 Refractory anemia, unspecified: Secondary | ICD-10-CM | POA: Diagnosis not present

## 2017-06-04 DIAGNOSIS — D649 Anemia, unspecified: Secondary | ICD-10-CM | POA: Diagnosis not present

## 2017-06-04 DIAGNOSIS — J811 Chronic pulmonary edema: Secondary | ICD-10-CM | POA: Diagnosis not present

## 2017-06-04 DIAGNOSIS — R531 Weakness: Secondary | ICD-10-CM | POA: Diagnosis not present

## 2017-06-04 DIAGNOSIS — I10 Essential (primary) hypertension: Secondary | ICD-10-CM | POA: Diagnosis not present

## 2017-06-04 DIAGNOSIS — J8410 Pulmonary fibrosis, unspecified: Secondary | ICD-10-CM | POA: Diagnosis not present

## 2017-06-04 DIAGNOSIS — K219 Gastro-esophageal reflux disease without esophagitis: Secondary | ICD-10-CM | POA: Diagnosis not present

## 2017-06-04 DIAGNOSIS — Z950 Presence of cardiac pacemaker: Secondary | ICD-10-CM | POA: Diagnosis not present

## 2017-06-04 DIAGNOSIS — I482 Chronic atrial fibrillation: Secondary | ICD-10-CM | POA: Diagnosis not present

## 2017-06-04 DIAGNOSIS — R079 Chest pain, unspecified: Secondary | ICD-10-CM | POA: Diagnosis not present

## 2017-06-04 DIAGNOSIS — N289 Disorder of kidney and ureter, unspecified: Secondary | ICD-10-CM | POA: Diagnosis not present

## 2017-06-04 DIAGNOSIS — E039 Hypothyroidism, unspecified: Secondary | ICD-10-CM | POA: Diagnosis not present

## 2017-06-04 DIAGNOSIS — I509 Heart failure, unspecified: Secondary | ICD-10-CM | POA: Diagnosis not present

## 2017-06-04 DIAGNOSIS — D638 Anemia in other chronic diseases classified elsewhere: Secondary | ICD-10-CM | POA: Diagnosis not present

## 2017-06-04 DIAGNOSIS — R918 Other nonspecific abnormal finding of lung field: Secondary | ICD-10-CM | POA: Diagnosis not present

## 2017-06-04 DIAGNOSIS — D72819 Decreased white blood cell count, unspecified: Secondary | ICD-10-CM | POA: Diagnosis not present

## 2017-06-04 DIAGNOSIS — D469 Myelodysplastic syndrome, unspecified: Secondary | ICD-10-CM | POA: Diagnosis not present

## 2017-06-04 DIAGNOSIS — R7989 Other specified abnormal findings of blood chemistry: Secondary | ICD-10-CM | POA: Diagnosis not present

## 2017-06-04 DIAGNOSIS — I080 Rheumatic disorders of both mitral and aortic valves: Secondary | ICD-10-CM | POA: Diagnosis not present

## 2017-06-04 DIAGNOSIS — I110 Hypertensive heart disease with heart failure: Secondary | ICD-10-CM | POA: Diagnosis not present

## 2017-06-04 LAB — CBC AND DIFFERENTIAL
HEMATOCRIT: 21 — AB (ref 36–46)
Hemoglobin: 6.1 — AB (ref 12.0–16.0)
PLATELETS: 268 (ref 150–399)
WBC: 5.1

## 2017-06-05 DIAGNOSIS — J84112 Idiopathic pulmonary fibrosis: Secondary | ICD-10-CM | POA: Diagnosis not present

## 2017-06-05 DIAGNOSIS — E785 Hyperlipidemia, unspecified: Secondary | ICD-10-CM | POA: Diagnosis not present

## 2017-06-05 DIAGNOSIS — Z950 Presence of cardiac pacemaker: Secondary | ICD-10-CM | POA: Diagnosis not present

## 2017-06-05 DIAGNOSIS — E039 Hypothyroidism, unspecified: Secondary | ICD-10-CM | POA: Diagnosis not present

## 2017-06-05 DIAGNOSIS — I482 Chronic atrial fibrillation: Secondary | ICD-10-CM | POA: Diagnosis not present

## 2017-06-05 DIAGNOSIS — I495 Sick sinus syndrome: Secondary | ICD-10-CM | POA: Diagnosis not present

## 2017-06-06 DIAGNOSIS — D464 Refractory anemia, unspecified: Secondary | ICD-10-CM | POA: Diagnosis not present

## 2017-06-06 DIAGNOSIS — D7589 Other specified diseases of blood and blood-forming organs: Secondary | ICD-10-CM | POA: Diagnosis not present

## 2017-06-06 DIAGNOSIS — M89551 Osteolysis, right thigh: Secondary | ICD-10-CM | POA: Diagnosis not present

## 2017-06-06 LAB — BASIC METABOLIC PANEL
BUN: 25 — AB (ref 4–21)
Creatinine: 1 (ref 0.5–1.1)
Glucose: 116
POTASSIUM: 4.8 (ref 3.4–5.3)
Sodium: 140 (ref 137–147)

## 2017-06-06 LAB — HEPATIC FUNCTION PANEL
ALT: 25 (ref 7–35)
AST: 18 (ref 13–35)
Alkaline Phosphatase: 71 (ref 25–125)
Bilirubin, Total: 0.6

## 2017-06-06 LAB — TSH: TSH: 2.36 (ref 0.41–5.90)

## 2017-06-06 LAB — VITAMIN B12: VITAMIN B 12: 588

## 2017-06-10 DIAGNOSIS — D649 Anemia, unspecified: Secondary | ICD-10-CM | POA: Diagnosis not present

## 2017-06-10 DIAGNOSIS — J8410 Pulmonary fibrosis, unspecified: Secondary | ICD-10-CM | POA: Diagnosis not present

## 2017-06-10 DIAGNOSIS — E538 Deficiency of other specified B group vitamins: Secondary | ICD-10-CM | POA: Diagnosis not present

## 2017-06-10 DIAGNOSIS — E039 Hypothyroidism, unspecified: Secondary | ICD-10-CM | POA: Diagnosis not present

## 2017-06-10 LAB — CBC AND DIFFERENTIAL
HEMATOCRIT: 23 — AB (ref 36–46)
Hemoglobin: 7.6 — AB (ref 12.0–16.0)
Platelets: 31 — AB (ref 150–399)
WBC: 4.3

## 2017-06-10 LAB — VITAMIN B12: VITAMIN B 12: 24

## 2017-06-12 DIAGNOSIS — T80A19A Non-ABO incompatibility with hemolytic transfusion reaction, unspecified, initial encounter: Secondary | ICD-10-CM | POA: Diagnosis not present

## 2017-06-12 DIAGNOSIS — D464 Refractory anemia, unspecified: Secondary | ICD-10-CM | POA: Diagnosis not present

## 2017-06-12 LAB — CBC AND DIFFERENTIAL
HCT: 23 — AB (ref 36–46)
Hemoglobin: 7.3 — AB (ref 12.0–16.0)
Platelets: 225 (ref 150–399)
WBC: 4.1

## 2017-06-16 DIAGNOSIS — R0789 Other chest pain: Secondary | ICD-10-CM | POA: Diagnosis not present

## 2017-06-16 DIAGNOSIS — I495 Sick sinus syndrome: Secondary | ICD-10-CM | POA: Diagnosis not present

## 2017-06-16 DIAGNOSIS — R531 Weakness: Secondary | ICD-10-CM | POA: Diagnosis not present

## 2017-06-16 DIAGNOSIS — D72819 Decreased white blood cell count, unspecified: Secondary | ICD-10-CM | POA: Diagnosis not present

## 2017-06-16 DIAGNOSIS — I509 Heart failure, unspecified: Secondary | ICD-10-CM | POA: Diagnosis not present

## 2017-06-16 DIAGNOSIS — R918 Other nonspecific abnormal finding of lung field: Secondary | ICD-10-CM | POA: Diagnosis not present

## 2017-06-16 DIAGNOSIS — I10 Essential (primary) hypertension: Secondary | ICD-10-CM | POA: Diagnosis not present

## 2017-06-16 DIAGNOSIS — R030 Elevated blood-pressure reading, without diagnosis of hypertension: Secondary | ICD-10-CM | POA: Diagnosis not present

## 2017-06-16 DIAGNOSIS — I482 Chronic atrial fibrillation: Secondary | ICD-10-CM | POA: Diagnosis not present

## 2017-06-16 DIAGNOSIS — R079 Chest pain, unspecified: Secondary | ICD-10-CM | POA: Diagnosis not present

## 2017-06-16 DIAGNOSIS — J811 Chronic pulmonary edema: Secondary | ICD-10-CM | POA: Diagnosis not present

## 2017-06-17 DIAGNOSIS — I509 Heart failure, unspecified: Secondary | ICD-10-CM | POA: Diagnosis not present

## 2017-06-17 DIAGNOSIS — I361 Nonrheumatic tricuspid (valve) insufficiency: Secondary | ICD-10-CM | POA: Diagnosis not present

## 2017-06-17 DIAGNOSIS — I482 Chronic atrial fibrillation: Secondary | ICD-10-CM | POA: Diagnosis not present

## 2017-06-17 DIAGNOSIS — I495 Sick sinus syndrome: Secondary | ICD-10-CM | POA: Diagnosis not present

## 2017-06-17 DIAGNOSIS — Z8709 Personal history of other diseases of the respiratory system: Secondary | ICD-10-CM | POA: Diagnosis not present

## 2017-06-25 DIAGNOSIS — E039 Hypothyroidism, unspecified: Secondary | ICD-10-CM | POA: Diagnosis not present

## 2017-06-25 DIAGNOSIS — I10 Essential (primary) hypertension: Secondary | ICD-10-CM | POA: Diagnosis not present

## 2017-06-25 DIAGNOSIS — Z950 Presence of cardiac pacemaker: Secondary | ICD-10-CM | POA: Diagnosis not present

## 2017-06-25 DIAGNOSIS — E784 Other hyperlipidemia: Secondary | ICD-10-CM | POA: Diagnosis not present

## 2017-06-25 DIAGNOSIS — Z8719 Personal history of other diseases of the digestive system: Secondary | ICD-10-CM | POA: Diagnosis not present

## 2017-06-25 DIAGNOSIS — J849 Interstitial pulmonary disease, unspecified: Secondary | ICD-10-CM | POA: Diagnosis not present

## 2017-06-25 DIAGNOSIS — K219 Gastro-esophageal reflux disease without esophagitis: Secondary | ICD-10-CM | POA: Diagnosis not present

## 2017-06-25 DIAGNOSIS — Z8249 Family history of ischemic heart disease and other diseases of the circulatory system: Secondary | ICD-10-CM | POA: Diagnosis not present

## 2017-06-25 DIAGNOSIS — I509 Heart failure, unspecified: Secondary | ICD-10-CM | POA: Diagnosis not present

## 2017-06-25 DIAGNOSIS — G4700 Insomnia, unspecified: Secondary | ICD-10-CM | POA: Diagnosis not present

## 2017-06-28 DIAGNOSIS — R0902 Hypoxemia: Secondary | ICD-10-CM | POA: Diagnosis not present

## 2017-06-28 DIAGNOSIS — Z8719 Personal history of other diseases of the digestive system: Secondary | ICD-10-CM | POA: Diagnosis not present

## 2017-06-28 DIAGNOSIS — G8929 Other chronic pain: Secondary | ICD-10-CM | POA: Diagnosis present

## 2017-06-28 DIAGNOSIS — D46C Myelodysplastic syndrome with isolated del(5q) chromosomal abnormality: Secondary | ICD-10-CM | POA: Diagnosis present

## 2017-06-28 DIAGNOSIS — K219 Gastro-esophageal reflux disease without esophagitis: Secondary | ICD-10-CM | POA: Diagnosis not present

## 2017-06-28 DIAGNOSIS — R4781 Slurred speech: Secondary | ICD-10-CM | POA: Diagnosis not present

## 2017-06-28 DIAGNOSIS — G9349 Other encephalopathy: Secondary | ICD-10-CM | POA: Diagnosis present

## 2017-06-28 DIAGNOSIS — D649 Anemia, unspecified: Secondary | ICD-10-CM | POA: Diagnosis not present

## 2017-06-28 DIAGNOSIS — J8410 Pulmonary fibrosis, unspecified: Secondary | ICD-10-CM | POA: Diagnosis present

## 2017-06-28 DIAGNOSIS — R4182 Altered mental status, unspecified: Secondary | ICD-10-CM | POA: Diagnosis not present

## 2017-06-28 DIAGNOSIS — Z79891 Long term (current) use of opiate analgesic: Secondary | ICD-10-CM | POA: Diagnosis not present

## 2017-06-28 DIAGNOSIS — R531 Weakness: Secondary | ICD-10-CM | POA: Diagnosis not present

## 2017-06-28 DIAGNOSIS — I10 Essential (primary) hypertension: Secondary | ICD-10-CM | POA: Diagnosis not present

## 2017-06-28 DIAGNOSIS — E7800 Pure hypercholesterolemia, unspecified: Secondary | ICD-10-CM | POA: Diagnosis not present

## 2017-06-28 DIAGNOSIS — M1990 Unspecified osteoarthritis, unspecified site: Secondary | ICD-10-CM | POA: Diagnosis not present

## 2017-06-28 DIAGNOSIS — M810 Age-related osteoporosis without current pathological fracture: Secondary | ICD-10-CM | POA: Diagnosis not present

## 2017-06-28 DIAGNOSIS — H9190 Unspecified hearing loss, unspecified ear: Secondary | ICD-10-CM | POA: Diagnosis not present

## 2017-06-28 DIAGNOSIS — J9611 Chronic respiratory failure with hypoxia: Secondary | ICD-10-CM | POA: Diagnosis present

## 2017-06-28 DIAGNOSIS — I340 Nonrheumatic mitral (valve) insufficiency: Secondary | ICD-10-CM | POA: Diagnosis not present

## 2017-06-28 DIAGNOSIS — Z79899 Other long term (current) drug therapy: Secondary | ICD-10-CM | POA: Diagnosis not present

## 2017-06-29 DIAGNOSIS — J9611 Chronic respiratory failure with hypoxia: Secondary | ICD-10-CM | POA: Diagnosis not present

## 2017-06-29 DIAGNOSIS — G9349 Other encephalopathy: Secondary | ICD-10-CM | POA: Diagnosis not present

## 2017-06-29 DIAGNOSIS — H9190 Unspecified hearing loss, unspecified ear: Secondary | ICD-10-CM | POA: Diagnosis not present

## 2017-06-29 DIAGNOSIS — G8929 Other chronic pain: Secondary | ICD-10-CM | POA: Diagnosis not present

## 2017-06-29 DIAGNOSIS — D46C Myelodysplastic syndrome with isolated del(5q) chromosomal abnormality: Secondary | ICD-10-CM | POA: Diagnosis not present

## 2017-06-29 DIAGNOSIS — D649 Anemia, unspecified: Secondary | ICD-10-CM | POA: Diagnosis not present

## 2017-06-29 DIAGNOSIS — J8410 Pulmonary fibrosis, unspecified: Secondary | ICD-10-CM | POA: Diagnosis not present

## 2017-07-04 DIAGNOSIS — D6189 Other specified aplastic anemias and other bone marrow failure syndromes: Secondary | ICD-10-CM | POA: Diagnosis not present

## 2017-07-04 DIAGNOSIS — D46C Myelodysplastic syndrome with isolated del(5q) chromosomal abnormality: Secondary | ICD-10-CM | POA: Diagnosis not present

## 2017-07-04 DIAGNOSIS — D469 Myelodysplastic syndrome, unspecified: Secondary | ICD-10-CM | POA: Diagnosis not present

## 2017-07-04 DIAGNOSIS — E038 Other specified hypothyroidism: Secondary | ICD-10-CM | POA: Diagnosis not present

## 2017-07-04 DIAGNOSIS — R0902 Hypoxemia: Secondary | ICD-10-CM | POA: Diagnosis not present

## 2017-07-04 DIAGNOSIS — I509 Heart failure, unspecified: Secondary | ICD-10-CM | POA: Diagnosis not present

## 2017-07-04 DIAGNOSIS — J84112 Idiopathic pulmonary fibrosis: Secondary | ICD-10-CM | POA: Diagnosis not present

## 2017-07-04 DIAGNOSIS — I10 Essential (primary) hypertension: Secondary | ICD-10-CM | POA: Diagnosis not present

## 2017-07-04 DIAGNOSIS — I059 Rheumatic mitral valve disease, unspecified: Secondary | ICD-10-CM | POA: Diagnosis not present

## 2017-07-05 DIAGNOSIS — D46C Myelodysplastic syndrome with isolated del(5q) chromosomal abnormality: Secondary | ICD-10-CM | POA: Diagnosis not present

## 2017-07-05 DIAGNOSIS — D469 Myelodysplastic syndrome, unspecified: Secondary | ICD-10-CM | POA: Diagnosis not present

## 2017-07-08 ENCOUNTER — Ambulatory Visit: Payer: Medicare Other

## 2017-07-16 ENCOUNTER — Ambulatory Visit (INDEPENDENT_AMBULATORY_CARE_PROVIDER_SITE_OTHER): Payer: Medicare Other | Admitting: Internal Medicine

## 2017-07-16 VITALS — BP 104/60 | HR 92 | Temp 98.0°F | Ht 62.5 in | Wt 138.0 lb

## 2017-07-16 DIAGNOSIS — I482 Chronic atrial fibrillation, unspecified: Secondary | ICD-10-CM

## 2017-07-16 DIAGNOSIS — D619 Aplastic anemia, unspecified: Secondary | ICD-10-CM | POA: Diagnosis not present

## 2017-07-16 DIAGNOSIS — Z86711 Personal history of pulmonary embolism: Secondary | ICD-10-CM | POA: Insufficient documentation

## 2017-07-16 DIAGNOSIS — Z1211 Encounter for screening for malignant neoplasm of colon: Secondary | ICD-10-CM | POA: Diagnosis not present

## 2017-07-16 DIAGNOSIS — E039 Hypothyroidism, unspecified: Secondary | ICD-10-CM

## 2017-07-16 DIAGNOSIS — G894 Chronic pain syndrome: Secondary | ICD-10-CM | POA: Insufficient documentation

## 2017-07-16 DIAGNOSIS — I509 Heart failure, unspecified: Secondary | ICD-10-CM | POA: Diagnosis not present

## 2017-07-16 DIAGNOSIS — Z23 Encounter for immunization: Secondary | ICD-10-CM

## 2017-07-16 DIAGNOSIS — G4700 Insomnia, unspecified: Secondary | ICD-10-CM | POA: Insufficient documentation

## 2017-07-16 DIAGNOSIS — G4701 Insomnia due to medical condition: Secondary | ICD-10-CM

## 2017-07-16 DIAGNOSIS — K219 Gastro-esophageal reflux disease without esophagitis: Secondary | ICD-10-CM

## 2017-07-16 DIAGNOSIS — J84112 Idiopathic pulmonary fibrosis: Secondary | ICD-10-CM | POA: Insufficient documentation

## 2017-07-16 DIAGNOSIS — D46C Myelodysplastic syndrome with isolated del(5q) chromosomal abnormality: Secondary | ICD-10-CM | POA: Diagnosis not present

## 2017-07-16 DIAGNOSIS — M810 Age-related osteoporosis without current pathological fracture: Secondary | ICD-10-CM

## 2017-07-16 LAB — CBC WITH DIFFERENTIAL/PLATELET
BASOS PCT: 1.7 %
Basophils Absolute: 51 cells/uL (ref 0–200)
EOS PCT: 0 %
Eosinophils Absolute: 0 cells/uL — ABNORMAL LOW (ref 15–500)
HEMATOCRIT: 30.5 % — AB (ref 35.0–45.0)
HEMOGLOBIN: 10.2 g/dL — AB (ref 11.7–15.5)
LYMPHS ABS: 945 {cells}/uL (ref 850–3900)
MCH: 31.8 pg (ref 27.0–33.0)
MCHC: 33.4 g/dL (ref 32.0–36.0)
MCV: 95 fL (ref 80.0–100.0)
MPV: 12.8 fL — ABNORMAL HIGH (ref 7.5–12.5)
Monocytes Relative: 10.3 %
NEUTROS ABS: 1695 {cells}/uL (ref 1500–7800)
Neutrophils Relative %: 56.5 %
Platelets: 222 10*3/uL (ref 140–400)
RBC: 3.21 10*6/uL — AB (ref 3.80–5.10)
RDW: 18.9 % — ABNORMAL HIGH (ref 11.0–15.0)
Total Lymphocyte: 31.5 %
WBC mixed population: 309 cells/uL (ref 200–950)
WBC: 3 10*3/uL — AB (ref 3.8–10.8)

## 2017-07-16 LAB — COMPLETE METABOLIC PANEL WITH GFR
AG Ratio: 1.3 (calc) (ref 1.0–2.5)
ALKALINE PHOSPHATASE (APISO): 76 U/L (ref 33–130)
ALT: 17 U/L (ref 6–29)
AST: 21 U/L (ref 10–35)
Albumin: 3.8 g/dL (ref 3.6–5.1)
BILIRUBIN TOTAL: 0.7 mg/dL (ref 0.2–1.2)
BUN/Creatinine Ratio: 21 (calc) (ref 6–22)
BUN: 21 mg/dL (ref 7–25)
CHLORIDE: 104 mmol/L (ref 98–110)
CO2: 29 mmol/L (ref 20–32)
Calcium: 9.6 mg/dL (ref 8.6–10.4)
Creat: 0.99 mg/dL — ABNORMAL HIGH (ref 0.60–0.88)
GFR, Est African American: 60 mL/min/{1.73_m2} (ref >=60)
GFR, Est Non African American: 52 mL/min/{1.73_m2} — ABNORMAL LOW (ref >=60)
GLUCOSE: 101 mg/dL — AB (ref 65–99)
Globulin: 3 g/dL (calc) (ref 1.9–3.7)
Potassium: 4.8 mmol/L (ref 3.5–5.3)
Sodium: 140 mmol/L (ref 135–146)
TOTAL PROTEIN: 6.8 g/dL (ref 6.1–8.1)

## 2017-07-16 LAB — TSH: TSH: 0.85 m[IU]/L (ref 0.40–4.50)

## 2017-07-16 LAB — T4, FREE: FREE T4: 1.2 ng/dL (ref 0.8–1.8)

## 2017-07-16 NOTE — Patient Instructions (Addendum)
Continue current medications as ordered  Will call with hematology referral  Continue O2 as ordered  Will call with lab results  Get old records  Influenza vaccine given today  Cologuard for colon cancer screening  Follow up in 1 month for AWV/EV (ECG, [facility]).

## 2017-07-16 NOTE — Progress Notes (Signed)
Patient ID: Kimberly Horn, female   DOB: 20-Jan-1930, 83 y.o.   MRN: [id number]    Location:  [facility] Place of Service: OFFICE    Advanced Directive information Does Patient Have a Medical Advance Directive?: No, Would patient like information on creating a medical advance directive?: No - Patient declined  Chief Complaint  Patient presents with  . Establish Care    New patient establish care, + fall risk,  patient has frequent blood transfusions, last transfusion was 1.5 weeks ago in [place]. Patient relocated from [place]. Here with daughter-in-law [place]   . Oxygen    Patient is currently on oxygen 3.5 liters, patient titrates to 4 when active   . FYI    Daughter in [place], [place] is a Doctor- [name]    HPI:  83 yo female seen today as a new pt. She has frequent blood transfusion 2/2 MDS that was dx about 1 mo ago. She was admitted to hospital  In 12/2016 with GI bleed. eliquis stopped at that time. She did require PRBCs at that time. SHE REQUIRES KJA ANTIBODY B+ BLOOD. She rec'd 4 units PRBCs on 06/16/17; 1-2 units transfused in [place], [place]; 2 units transfused 07/05/17. GOAL HGB 9-10 (when she drops <9, she gets lightheaded, frequent falls and SOB).  PAF - s/p pacer. eliquis stopped 2/2 anemia.  CHF - on prn lasix with KCl. Last 2D echo unknown. Followed by cardio in [place], [place]  Idiopathic pulmonary fibrosis - O2 dependent @ 3L/min and increases to 4L/min with moderate exertion. Takes singulair. Has seen pulmonary in the past  Seasonal allergy - takes singulair. She has allergy to dust and sycamore trees.  Anemia/MDS with 5q deletion - requires frequent PRBCs 2/2 GI bleeds. She had BM bx about 1 month ago that revealed MDS with 5q deletion  Hypothyroidism - takes levothyroxine  PE/DVT hx - she does not have an IVC filter. eliquis stopped 2/2 anemia  GERD - stable on omeprazole and carafate. Belching controlled  Chronic pain syndrome - due to back. She has a hx back sx. Takes norco qhs  and daily cymbalta  Insomnia - problems falling asleep. stable on melatonin 15m  Osteoporosis - takes calcium and Vit D. She was taking prolia injections and states she was due 2 mos ago.  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  Past Medical History:  Diagnosis Date  . Atrial fibrillation ([facility]   . CHF (congestive heart failure) ([facility]   . Difficulty hearing   . Diverticulitis   . DVT (deep venous thrombosis) ([facility]   . Flu 11/04/2014  . GI bleed   . Hypothyroid   . Hypothyroidism   . Low blood pressure    when hbg is bekow 9  . Myelodysplastic syndrome with 5q deletion ([facility]   . Pacemaker   . Peptic ulcer    with GERD  . Pulmonary embolism ([facility]   . Pulmonary fibrosis ([facility]     Past Surgical History:  Procedure Laterality Date  . ABLATION    . APPENDECTOMY    . BACK SURGERY    . BILATERAL CARPAL TUNNEL RELEASE    . CATARACT EXTRACTION, BILATERAL    . CHOLECYSTECTOMY    . ESOPHAGOGASTRODUODENOSCOPY (EGD) WITH PROPOFOL N/A 12/22/2016   Procedure: ESOPHAGOGASTRODUODENOSCOPY (EGD) WITH PROPOFOL;  Surgeon: KMauri Pole MD;  [facility];  Service: Endoscopy;  Laterality: N/A;  .  HERNIA REPAIR    . PACEMAKER INSERTION      Patient Care Team: System, Pcp Not In as PCP - General  Social History   Social History  . Marital status: Widowed    Spouse name: N/A  . Number of children: 5  . Years of education: N/A   Occupational History  . Not on file.   Social History Main Topics  . Smoking status: Never Smoker  . Smokeless tobacco: Never Used  . Alcohol use No  . Drug use: No  . Sexual activity: Not Currently   Other Topics Concern  . Not on file   Social History Narrative   Social History      Diet? I eat what I want      Do you drink/eat things with caffeine? yes      Marital status?            widowed                        What  year were you married? 1950      Do you live in a house, apartment, assisted living, condo, trailer, etc.? With family      Is it one or more stories? 2 stories but stay on 1st floor       How many persons live in your home?       Do you have any pets in your home? (please list) usually 0 (but currently 1 cat)      Highest level of education completed?       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         some                             Type & how often? When I think about it      Advanced Directives      Do you have a living will? no      Do you have a DNR form?     no                             If not, do you want to discuss one? no      Do you have signed POA/HPOA for forms?  no      Functional Status      Do you have difficulty bathing or dressing yourself?      Do you have difficulty preparing food or eating?       Do you have difficulty managing your medications?      Do you have difficulty managing your finances?      Do you have difficulty affording your medications?        reports that she has never smoked. She has never used smokeless tobacco. She reports that she does not drink alcohol or use drugs.  Family History  Problem Relation Age of Onset  . Heart attack Mother   . Cancer Father        throat  . Arthritis Sister   . Lung disease Brother   . Heart disease Brother   . Cancer Brother   . Gout Brother   . Arthritis Brother   . Stroke Son   . Diabetes Son    Family Status  Relation  Status  . Mother Deceased  . Father Deceased  . Sister Alive  . Brother Deceased  . Brother Deceased  . Brother Alive  . Sister Deceased  . Sister Alive  . Brother Alive  . Daughter Alive  . Daughter Alive  . Daughter Alive  . Son Alive  . Son Alive    Immunization History  Administered Date(s) Administered  . Influenza,inj,Quad PF,6+ Mos 07/16/2017    Allergies  Allergen Reactions  . Demerol  [Meperidine] Other (See Comments)    INTENSIFIED FEELINGS  . Hydroxyzine Other (See Comments)    INTENSIFIED FEELINGS  . Talwin [Pentazocine] Other (See Comments)    INTENSIFIED FEELINGS    Medications: Patient's Medications  New Prescriptions   No medications on file  Previous Medications   CALCIUM-VITAMIN D-VITAMIN K (VIACTIV PO)    Take by mouth. Plus D and K 2 by mouth at lunch   CHOLECALCIFEROL (VITAMIN D3) 5000 UNITS CAPS    Take 1 capsule by mouth daily.   DULOXETINE (CYMBALTA) 30 MG CAPSULE    Take 30 mg by mouth 2 (two) times daily.   FUROSEMIDE (LASIX) 20 MG TABLET    Take 20 mg by mouth as needed.    HYDROCODONE-ACETAMINOPHEN (NORCO/VICODIN) 5-325 MG TABLET    Take 1 tablet by mouth at bedtime. Only takes as needed    LEVOTHYROXINE (SYNTHROID, LEVOTHROID) 100 MCG TABLET    Take 100 mcg by mouth daily. In the morning   MELATONIN 10 MG CAPS    Take 1 tablet by mouth daily.   MONTELUKAST (SINGULAIR) 10 MG TABLET    Take 10 mg by mouth daily. In the morning   MULTIPLE VITAMINS-MINERALS (CENTRUM SILVER ULTRA MENS PO)    Take by mouth daily.   OMEPRAZOLE (PRILOSEC) 40 MG CAPSULE    Take 1 capsule (40 mg total) by mouth 2 (two) times daily.   POTASSIUM CHLORIDE SA (K-DUR,KLOR-CON) 20 MEQ TABLET    Take 20 mEq by mouth as needed. When taking furosemide   SUCRALFATE (CARAFATE) 1 GM/10ML SUSPENSION    Take 1 g by mouth 2 (two) times daily.  Modified Medications   No medications on file  Discontinued Medications   CALCIUM CARBONATE (OSCAL) 1500 (600 CA) MG TABS TABLET    Take 600 mg of elemental calcium by mouth 2 (two) times daily with a meal.   MULTIPLE VITAMIN (MULTIVITAMIN WITH MINERALS) TABS TABLET    Take 1 tablet by mouth daily.    Review of Systems  Constitutional: Positive for fatigue.  HENT: Positive for hearing loss (wears hearing aids) and tinnitus.        Loss of smell  Eyes: Positive for visual disturbance (wears glasses).  Respiratory: Positive for cough and  shortness of breath.   Gastrointestinal:       Diverticulosis; hemorrhoids  Musculoskeletal: Positive for back pain and gait problem.       Rib pain s/p fall  Neurological: Positive for syncope (and fainting).  Psychiatric/Behavioral:       Stress  All other systems reviewed and are negative. per NP packet  Vitals:   07/16/17 0848  BP: 104/60  Pulse: 92  Temp: 98 F (36.7 C)  TempSrc: Oral  SpO2: 99%  Weight: 138 lb (62.6 kg)  Height: 5' 2.5" (1.588 m)   Body mass index is 24.84 kg/m.  Physical Exam  Constitutional: She is oriented to person, place, and time. She appears well-developed and well-nourished.  HOH; frail appearing in NAD, White O2  intact; no conversational dyspnea  HENT:  Mouth/Throat: Oropharynx is clear and moist. No oropharyngeal exudate.  MMM; no oral thrush  Eyes: Pupils are equal, round, and reactive to light. No scleral icterus.  Neck: Neck supple. Carotid bruit is not present. No tracheal deviation present. No thyromegaly present.  Cardiovascular: Normal rate, regular rhythm and intact distal pulses.  Exam reveals no gallop and no friction rub.   Murmur (1/6 SEM) heard. No LE edema b/l. no calf TTP.   Pulmonary/Chest: Effort normal. No stridor. No respiratory distress. She has no wheezes. She has rales (L>R basilar expiratory). She exhibits tenderness (left costal angle stuck up).  ACW pacer palpable  Abdominal: Soft. Normal appearance and bowel sounds are normal. She exhibits no distension and no mass. There is no hepatomegaly. There is tenderness (LUQ). There is no rigidity, no rebound and no guarding. No hernia.  Musculoskeletal: She exhibits edema, tenderness and deformity (kyphosis).  Lymphadenopathy:    She has no cervical adenopathy.  Neurological: She is alert and oriented to person, place, and time.  Skin: Skin is warm and dry. No rash noted.  Psychiatric: She has a normal mood and affect. Her behavior is normal. Judgment and thought content  normal.     Labs reviewed: No visits with results within 3 Month(s) from this visit.  Latest known visit with results is:  Admission on 12/20/2016, Discharged on 12/24/2016  Component Date Value Ref Range Status  . WBC 12/20/2016 3.9* 4.0 - 10.5 K/uL Final  . RBC 12/20/2016 2.47* 3.87 - 5.11 MIL/uL Final  . Hemoglobin 12/20/2016 8.8* 12.0 - 15.0 g/dL Final  . HCT 12/20/2016 27.0* 36.0 - 46.0 % Final  . MCV 12/20/2016 109.3* 78.0 - 100.0 fL Final  . MCH 12/20/2016 35.6* 26.0 - 34.0 pg Final  . MCHC 12/20/2016 32.6  30.0 - 36.0 g/dL Final  . RDW 12/20/2016 18.3* 11.5 - 15.5 % Final  . Platelets 12/20/2016 240  150 - 400 K/uL Final  . Sodium 12/20/2016 138  135 - 145 mmol/L Final  . Potassium 12/20/2016 3.7  3.5 - 5.1 mmol/L Final  . Chloride 12/20/2016 104  101 - 111 mmol/L Final  . CO2 12/20/2016 27  22 - 32 mmol/L Final  . Glucose, Bld 12/20/2016 145* 65 - 99 mg/dL Final  . BUN 12/20/2016 15  6 - 20 mg/dL Final  . Creatinine, Ser 12/20/2016 0.85  0.44 - 1.00 mg/dL Final  . Calcium 12/20/2016 9.0  8.9 - 10.3 mg/dL Final  . Total Protein 12/20/2016 6.1* 6.5 - 8.1 g/dL Final  . Albumin 12/20/2016 3.3* 3.5 - 5.0 g/dL Final  . AST 12/20/2016 18  15 - 41 U/L Final  . ALT 12/20/2016 19  14 - 54 U/L Final  . Alkaline Phosphatase 12/20/2016 51  38 - 126 U/L Final  . Total Bilirubin 12/20/2016 0.5  0.3 - 1.2 mg/dL Final  . GFR calc non Af Amer 12/20/2016 >60  >60 mL/min Final  . GFR calc Af Amer 12/20/2016 >60  >60 mL/min Final   Comment: (NOTE) The eGFR has been calculated using the CKD EPI equation. This calculation has not been validated in all clinical situations. eGFR's persistently <60 mL/min signify possible Chronic Kidney Disease.   . Anion gap 12/20/2016 7  5 - 15 Final  . ABO/RH(D) 12/20/2016 B POS   Final  . Antibody Screen 12/20/2016 NEG   Final  . Sample Expiration 12/20/2016 12/23/2016   Final  . Unit Number 12/20/2016 [id number]  Final  . Blood Component Type  12/20/2016 RED CELLS,LR   Final  . Unit division 12/20/2016 00   Final  . Status of Unit 12/20/2016 ISSUED,FINAL   Final  . Transfusion Status 12/20/2016 OK TO TRANSFUSE   Final  . Crossmatch Result 12/20/2016 Compatible   Final  . Unit Number 12/20/2016 [id number]   Final  . Blood Component Type 12/20/2016 RED CELLS,LR   Final  . Unit division 12/20/2016 00   Final  . Status of Unit 12/20/2016 ISSUED,FINAL   Final  . Transfusion Status 12/20/2016 OK TO TRANSFUSE   Final  . Crossmatch Result 12/20/2016 Compatible   Final  . Unit Number 12/20/2016 [id number]   Final  . Blood Component Type 12/20/2016 RED CELLS,LR   Final  . Unit division 12/20/2016 00   Final  . Status of Unit 12/20/2016 REL FROM [facility]   Final  . Transfusion Status 12/20/2016 OK TO TRANSFUSE   Final  . Crossmatch Result 12/20/2016 Compatible   Final  . Fecal Occult Bld 12/21/2016 POSITIVE* NEGATIVE Final  . ABO/RH(D) 12/20/2016 B POS   Final  . Troponin I 12/21/2016 <0.03  <0.03 ng/mL Final  . Hemoglobin 12/21/2016 8.4* 12.0 - 15.0 g/dL Final  . HCT 12/21/2016 25.5* 36.0 - 46.0 % Final  . TSH 12/21/2016 2.207  0.350 - 4.500 uIU/mL Final   Performed by a 3rd Generation assay with a functional sensitivity of <=0.01 uIU/mL.  . Order Confirmation 12/21/2016 ORDER PROCESSED BY BLOOD BANK   Final  . Hemoglobin 12/21/2016 8.5* 12.0 - 15.0 g/dL Final  . HCT 12/21/2016 26.0* 36.0 - 46.0 % Final  . Troponin I 12/21/2016 <0.03  <0.03 ng/mL Final  . Vitamin B-12 12/21/2016 306  180 - 914 pg/mL Final   Comment: (NOTE) This assay is not validated for testing neonatal or myeloproliferative syndrome specimens for Vitamin B12 levels.   . Iron 12/21/2016 209* 28 - 170 ug/dL Final  . TIBC 12/21/2016 263  250 - 450 ug/dL Final  . Saturation Ratios 12/21/2016 79* 10.4 - 31.8 % Final  . UIBC 12/21/2016 54  ug/dL Final  . Ferritin 12/21/2016 220  11 - 307 ng/mL Final  . Folate 12/21/2016 28.5  >5.9 ng/mL Final  . Retic Ct Pct  12/21/2016 2.8  0.4 - 3.1 % Final  . RBC. 12/21/2016 2.44* 3.87 - 5.11 MIL/uL Final  . Retic Count, Absolute 12/21/2016 68.3  19.0 - 186.0 K/uL Final  . WBC 12/22/2016 2.9* 4.0 - 10.5 K/uL Final  . RBC 12/22/2016 2.26* 3.87 - 5.11 MIL/uL Final  . Hemoglobin 12/22/2016 7.9* 12.0 - 15.0 g/dL Final  . HCT 12/22/2016 24.0* 36.0 - 46.0 % Final  . MCV 12/22/2016 106.2* 78.0 - 100.0 fL Final  . MCH 12/22/2016 35.0* 26.0 - 34.0 pg Final  . MCHC 12/22/2016 32.9  30.0 - 36.0 g/dL Final  . RDW 12/22/2016 20.9* 11.5 - 15.5 % Final  . Platelets 12/22/2016 178  150 - 400 K/uL Final  . Sodium 12/22/2016 140  135 - 145 mmol/L Final  . Potassium 12/22/2016 3.6  3.5 - 5.1 mmol/L Final  . Chloride 12/22/2016 108  101 - 111 mmol/L Final  . CO2 12/22/2016 25  22 - 32 mmol/L Final  . Glucose, Bld 12/22/2016 93  65 - 99 mg/dL Final  . BUN 12/22/2016 12  6 - 20 mg/dL Final  . Creatinine, Ser 12/22/2016 0.79  0.44 - 1.00 mg/dL Final  . Calcium 12/22/2016 8.2* 8.9 - 10.3 mg/dL Final  .  GFR calc non Af Amer 12/22/2016 >60  >60 mL/min Final  . GFR calc Af Amer 12/22/2016 >60  >60 mL/min Final   Comment: (NOTE) The eGFR has been calculated using the CKD EPI equation. This calculation has not been validated in all clinical situations. eGFR's persistently <60 mL/min signify possible Chronic Kidney Disease.   . Anion gap 12/22/2016 7  5 - 15 Final  . Order Confirmation 12/22/2016 ORDER PROCESSED BY BLOOD BANK BB SAMPLE OR UNITS ALREADY AVAILABLE   Final  . Sodium 12/23/2016 142  135 - 145 mmol/L Final  . Potassium 12/23/2016 3.9  3.5 - 5.1 mmol/L Final  . Chloride 12/23/2016 109  101 - 111 mmol/L Final  . CO2 12/23/2016 28  22 - 32 mmol/L Final  . Glucose, Bld 12/23/2016 94  65 - 99 mg/dL Final  . BUN 12/23/2016 7  6 - 20 mg/dL Final  . Creatinine, Ser 12/23/2016 0.78  0.44 - 1.00 mg/dL Final  . Calcium 12/23/2016 8.1* 8.9 - 10.3 mg/dL Final  . GFR calc non Af Amer 12/23/2016 >60  >60 mL/min Final  . GFR calc  Af Amer 12/23/2016 >60  >60 mL/min Final   Comment: (NOTE) The eGFR has been calculated using the CKD EPI equation. This calculation has not been validated in all clinical situations. eGFR's persistently <60 mL/min signify possible Chronic Kidney Disease.   . Anion gap 12/23/2016 5  5 - 15 Final  . WBC 12/23/2016 3.5* 4.0 - 10.5 K/uL Final  . RBC 12/23/2016 2.62* 3.87 - 5.11 MIL/uL Final  . Hemoglobin 12/23/2016 9.1* 12.0 - 15.0 g/dL Final  . HCT 12/23/2016 27.3* 36.0 - 46.0 % Final  . MCV 12/23/2016 104.2* 78.0 - 100.0 fL Final  . MCH 12/23/2016 34.7* 26.0 - 34.0 pg Final  . MCHC 12/23/2016 33.3  30.0 - 36.0 g/dL Final  . RDW 12/23/2016 21.7* 11.5 - 15.5 % Final  . Platelets 12/23/2016 177  150 - 400 K/uL Final  . Hemoglobin 12/22/2016 9.8* 12.0 - 15.0 g/dL Final  . HCT 12/22/2016 29.8* 36.0 - 46.0 % Final  . Hemoglobin 12/23/2016 9.4* 12.0 - 15.0 g/dL Final  . HCT 12/23/2016 28.0* 36.0 - 46.0 % Final  . Hemoglobin 12/23/2016 9.4* 12.0 - 15.0 g/dL Final  . HCT 12/23/2016 28.8* 36.0 - 46.0 % Final  . Color, Urine 12/23/2016 YELLOW  YELLOW Final  . APPearance 12/23/2016 CLEAR  CLEAR Final  . Specific Gravity, Urine 12/23/2016 1.015  1.005 - 1.030 Final  . pH 12/23/2016 5.0  5.0 - 8.0 Final  . Glucose, UA 12/23/2016 NEGATIVE  NEGATIVE mg/dL Final  . Hgb urine dipstick 12/23/2016 NEGATIVE  NEGATIVE Final  . Bilirubin Urine 12/23/2016 NEGATIVE  NEGATIVE Final  . Ketones, ur 12/23/2016 NEGATIVE  NEGATIVE mg/dL Final  . Protein, ur 12/23/2016 NEGATIVE  NEGATIVE mg/dL Final  . Nitrite 12/23/2016 NEGATIVE  NEGATIVE Final  . Leukocytes, UA 12/23/2016 NEGATIVE  NEGATIVE Final  . Hemoglobin 12/24/2016 8.9* 12.0 - 15.0 g/dL Final  . HCT 12/24/2016 26.8* 36.0 - 46.0 % Final  . Hemoglobin 12/24/2016 9.3* 12.0 - 15.0 g/dL Final  . HCT 12/24/2016 28.2* 36.0 - 46.0 % Final    No results found.   Assessment/Plan   ICD-10-CM   1. MDS (myelodysplastic syndrome) with 5q deletion ([place]) D46.C  CMP with eGFR    Ambulatory referral to Hematology  2. Anemia due to bone marrow failure, unspecified bone marrow failure type ([employer]) D61.9 CBC with Differential/Platelets    Ambulatory  referral to Hematology  3. Chronic atrial fibrillation (HCC) I48.2   4. Hypothyroidism (acquired) E03.9 TSH    T4, Free  5. Gastroesophageal reflux disease without esophagitis K21.9   6. Chronic pain syndrome G89.4 CMP with eGFR  7. Insomnia due to medical condition G47.01   8. Idiopathic pulmonary fibrosis ([place]) J84.112   9. Congestive heart failure, unspecified HF chronicity, unspecified heart failure type ([place]) I50.9 CMP with eGFR  10. Age-related osteoporosis without current pathological fracture M81.0   11. History of pulmonary embolus (PE) Z86.711   12. Colon cancer screening Z12.11   13. Need for immunization against influenza Z23 Flu Vaccine QUAD 36+ mos IM   Continue current medications as ordered  Will call with hematology referral  Continue O2 as ordered  Will call with lab results  Get old records  Influenza vaccine given today  Cologuard for colon cancer screening  Follow up in 1 month for AWV/EV (ECG, MMSE)   Bea Duren S. Perlie Gold  [facility] [address] [place],  [address] [phone] Cell (Monday-Friday 8 AM - 5 PM) [phone] After 5 PM and follow prompts

## 2017-07-17 ENCOUNTER — Other Ambulatory Visit: Payer: Self-pay

## 2017-07-17 ENCOUNTER — Telehealth: Payer: Self-pay | Admitting: *Deleted

## 2017-07-17 DIAGNOSIS — D62 Acute posthemorrhagic anemia: Secondary | ICD-10-CM

## 2017-07-17 NOTE — Telephone Encounter (Signed)
Lamona Curl, caregiver called requesting the results from the labs patient had done yesterday. Stated that she would like the Hemoglogin results because they have to stay up. Please Advise.

## 2017-07-17 NOTE — Telephone Encounter (Signed)
See results

## 2017-07-17 NOTE — Telephone Encounter (Signed)
Debbie spoke with patient's daughter.

## 2017-07-23 ENCOUNTER — Other Ambulatory Visit: Payer: Medicare Other

## 2017-07-23 DIAGNOSIS — D62 Acute posthemorrhagic anemia: Secondary | ICD-10-CM | POA: Diagnosis not present

## 2017-07-23 LAB — CBC WITH DIFFERENTIAL/PLATELET
BASOS PCT: 1 %
Basophils Absolute: 40 cells/uL (ref 0–200)
EOS ABS: 0 {cells}/uL — AB (ref 15–500)
Eosinophils Relative: 0 %
HEMATOCRIT: 28.4 % — AB (ref 35.0–45.0)
HEMOGLOBIN: 9.5 g/dL — AB (ref 11.7–15.5)
LYMPHS ABS: 1424 {cells}/uL (ref 850–3900)
MCH: 32.1 pg (ref 27.0–33.0)
MCHC: 33.5 g/dL (ref 32.0–36.0)
MCV: 95.9 fL (ref 80.0–100.0)
MPV: 12.7 fL — AB (ref 7.5–12.5)
Monocytes Relative: 10.2 %
NEUTROS ABS: 2128 {cells}/uL (ref 1500–7800)
Neutrophils Relative %: 53.2 %
PLATELETS: 232 10*3/uL (ref 140–400)
RBC: 2.96 10*6/uL — AB (ref 3.80–5.10)
RDW: 20 % — ABNORMAL HIGH (ref 11.0–15.0)
Total Lymphocyte: 35.6 %
WBC: 4 10*3/uL (ref 3.8–10.8)
WBCMIX: 408 {cells}/uL (ref 200–950)

## 2017-07-24 ENCOUNTER — Encounter: Payer: Self-pay | Admitting: *Deleted

## 2017-07-24 ENCOUNTER — Other Ambulatory Visit: Payer: Self-pay | Admitting: *Deleted

## 2017-07-30 ENCOUNTER — Ambulatory Visit (HOSPITAL_COMMUNITY)
Admission: RE | Admit: 2017-07-30 | Discharge: 2017-07-30 | Disposition: A | Discharge status: Home / Self Care | Payer: Medicare Other | Source: Ambulatory Visit | Attending: Hematology and Oncology | Admitting: Hematology and Oncology

## 2017-07-30 ENCOUNTER — Ambulatory Visit (HOSPITAL_BASED_OUTPATIENT_CLINIC_OR_DEPARTMENT_OTHER): Payer: Medicare Other | Admitting: Hematology and Oncology

## 2017-07-30 ENCOUNTER — Telehealth: Payer: Self-pay | Admitting: Hematology and Oncology

## 2017-07-30 ENCOUNTER — Other Ambulatory Visit: Payer: Self-pay

## 2017-07-30 ENCOUNTER — Telehealth: Payer: Self-pay

## 2017-07-30 ENCOUNTER — Ambulatory Visit (HOSPITAL_BASED_OUTPATIENT_CLINIC_OR_DEPARTMENT_OTHER): Payer: Medicare Other

## 2017-07-30 VITALS — BP 121/50 | HR 91 | Temp 98.2°F | Resp 16 | Ht 62.5 in | Wt 138.2 lb

## 2017-07-30 DIAGNOSIS — R5383 Other fatigue: Secondary | ICD-10-CM

## 2017-07-30 DIAGNOSIS — D46C Myelodysplastic syndrome with isolated del(5q) chromosomal abnormality: Secondary | ICD-10-CM | POA: Diagnosis not present

## 2017-07-30 DIAGNOSIS — D701 Agranulocytosis secondary to cancer chemotherapy: Secondary | ICD-10-CM

## 2017-07-30 DIAGNOSIS — I4891 Unspecified atrial fibrillation: Secondary | ICD-10-CM

## 2017-07-30 DIAGNOSIS — T451X5A Adverse effect of antineoplastic and immunosuppressive drugs, initial encounter: Secondary | ICD-10-CM

## 2017-07-30 DIAGNOSIS — Z86718 Personal history of other venous thrombosis and embolism: Secondary | ICD-10-CM

## 2017-07-30 DIAGNOSIS — J8410 Pulmonary fibrosis, unspecified: Secondary | ICD-10-CM | POA: Diagnosis not present

## 2017-07-30 DIAGNOSIS — D649 Anemia, unspecified: Secondary | ICD-10-CM

## 2017-07-30 DIAGNOSIS — I509 Heart failure, unspecified: Secondary | ICD-10-CM | POA: Diagnosis not present

## 2017-07-30 LAB — CBC & DIFF AND RETIC
BASO%: 1.1 % (ref 0.0–2.0)
Basophils Absolute: 0 10*3/uL (ref 0.0–0.1)
EOS%: 0 % (ref 0.0–7.0)
Eosinophils Absolute: 0 10*3/uL (ref 0.0–0.5)
HCT: 27.3 % — ABNORMAL LOW (ref 34.8–46.6)
HGB: 9 g/dL — ABNORMAL LOW (ref 11.6–15.9)
Immature Retic Fract: 11.9 % — ABNORMAL HIGH (ref 1.60–10.00)
LYMPH%: 37.3 % (ref 14.0–49.7)
MCH: 32.7 pg (ref 25.1–34.0)
MCHC: 33 g/dL (ref 31.5–36.0)
MCV: 99.3 fL (ref 79.5–101.0)
MONO#: 0.3 10*3/uL (ref 0.1–0.9)
MONO%: 7.1 % (ref 0.0–14.0)
NEUT#: 2.1 10*3/uL (ref 1.5–6.5)
NEUT%: 54.5 % (ref 38.4–76.8)
Platelets: 222 10*3/uL (ref 145–400)
RBC: 2.75 10*6/uL — ABNORMAL LOW (ref 3.70–5.45)
RDW: 21.7 % — ABNORMAL HIGH (ref 11.2–14.5)
Retic %: 2.26 % — ABNORMAL HIGH (ref 0.70–2.10)
Retic Ct Abs: 62.15 10*3/uL (ref 33.70–90.70)
WBC: 3.8 10*3/uL — ABNORMAL LOW (ref 3.9–10.3)
lymph#: 1.4 10*3/uL (ref 0.9–3.3)
nRBC: 0 % (ref 0–0)

## 2017-07-30 LAB — LACTATE DEHYDROGENASE: LDH: 272 U/L — ABNORMAL HIGH (ref 125–245)

## 2017-07-30 LAB — COMPREHENSIVE METABOLIC PANEL
ALT: 23 U/L (ref 0–55)
AST: 22 U/L (ref 5–34)
Albumin: 3.9 g/dL (ref 3.5–5.0)
Alkaline Phosphatase: 76 U/L (ref 40–150)
Anion Gap: 9 mEq/L (ref 3–11)
BUN: 20.4 mg/dL (ref 7.0–26.0)
CO2: 28 mEq/L (ref 22–29)
Calcium: 9.7 mg/dL (ref 8.4–10.4)
Chloride: 103 mEq/L (ref 98–109)
Creatinine: 0.9 mg/dL (ref 0.6–1.1)
EGFR: 57 mL/min/{1.73_m2} — ABNORMAL LOW (ref >90)
Glucose: 88 mg/dl (ref 70–140)
Potassium: 4.4 mEq/L (ref 3.5–5.1)
Sodium: 140 mEq/L (ref 136–145)
Total Bilirubin: 0.74 mg/dL (ref 0.20–1.20)
Total Protein: 7.6 g/dL (ref 6.4–8.3)

## 2017-07-30 NOTE — Telephone Encounter (Signed)
Gave avs and calendar for October  °

## 2017-07-30 NOTE — Telephone Encounter (Signed)
Pt to receive 1 unit PRBCs in [facility] on Friday at 10am. Pt and daughter given directions and instructions upon entering [place]. Orders placed for 1 unit with type and screen prior. Call admitting who is working on a [place] for the pt. Blood Bank called to notify of infusion on Friday.

## 2017-07-30 NOTE — Telephone Encounter (Signed)
Error

## 2017-07-31 LAB — IRON AND TIBC
%SAT: 82 % — ABNORMAL HIGH (ref 21–57)
Iron: 221 ug/dL — ABNORMAL HIGH (ref 41–142)
TIBC: 269 ug/dL (ref 236–444)
UIBC: 48 ug/dL — ABNORMAL LOW (ref 120–384)

## 2017-07-31 LAB — FOLATE: Folate: >20 ng/mL (ref >3.0)

## 2017-07-31 LAB — FERRITIN

## 2017-07-31 LAB — TSH: TSH: 0.458 m[IU]/L (ref 0.308–3.960)

## 2017-07-31 LAB — ERYTHROPOIETIN: Erythropoietin: 457.1 m[IU]/mL — ABNORMAL HIGH (ref 2.6–18.5)

## 2017-08-01 ENCOUNTER — Ambulatory Visit (HOSPITAL_COMMUNITY)
Admission: RE | Admit: 2017-08-01 | Discharge: 2017-08-01 | Disposition: A | Discharge status: Home / Self Care | Payer: Medicare Other | Source: Ambulatory Visit | Attending: Hematology and Oncology | Admitting: Hematology and Oncology

## 2017-08-01 ENCOUNTER — Encounter: Payer: Self-pay | Admitting: *Deleted

## 2017-08-01 DIAGNOSIS — D649 Anemia, unspecified: Secondary | ICD-10-CM

## 2017-08-01 DIAGNOSIS — D46C Myelodysplastic syndrome with isolated del(5q) chromosomal abnormality: Secondary | ICD-10-CM

## 2017-08-01 LAB — PREPARE RBC (CROSSMATCH)

## 2017-08-01 LAB — COPPER, SERUM: Copper: 99 ug/dL (ref 72–166)

## 2017-08-01 MED ORDER — FUROSEMIDE 10 MG/ML IJ SOLN
20.0000 mg | Freq: Once | INTRAMUSCULAR | Status: AC
  Administered 2017-08-01: 20 mg via INTRAVENOUS
  Filled 2017-08-01: qty 2

## 2017-08-01 MED ORDER — ACETAMINOPHEN 325 MG PO TABS
650.0000 mg | ORAL_TABLET | Freq: Once | ORAL | Status: AC
  Administered 2017-08-01: 650 mg via ORAL
  Filled 2017-08-01: qty 2

## 2017-08-01 MED ORDER — SODIUM CHLORIDE 0.9 % IV SOLN
250.0000 mL | Freq: Once | INTRAVENOUS | Status: AC
  Administered 2017-08-01: 250 mL via INTRAVENOUS

## 2017-08-01 NOTE — Progress Notes (Signed)
Pt presented to Patient Kimberly Horn for transfusion of 1 unit PRBC. Pt received transfusion without reaction or incident. Alert, oriented at time of discharge, accompanied by daughter in law, [place].  Coolidge Breeze, RN 08/01/2017 1400

## 2017-08-02 LAB — BPAM RBC
Blood Product Expiration Date: 201810142359
ISSUE DATE / TIME: 201809281048
Unit Type and Rh: 5100

## 2017-08-02 LAB — TYPE AND SCREEN
ABO/RH(D): B POS
Antibody Screen: POSITIVE
DAT, IGG: NEGATIVE
Donor AG Type: NEGATIVE
PT AG Type: NEGATIVE
UNIT DIVISION: 0

## 2017-08-04 ENCOUNTER — Ambulatory Visit (HOSPITAL_COMMUNITY)
Admission: RE | Admit: 2017-08-04 | Discharge: 2017-08-04 | Disposition: A | Discharge status: Home / Self Care | Payer: Medicare Other | Source: Ambulatory Visit | Attending: Hematology and Oncology | Admitting: Hematology and Oncology

## 2017-08-04 DIAGNOSIS — D46C Myelodysplastic syndrome with isolated del(5q) chromosomal abnormality: Secondary | ICD-10-CM | POA: Insufficient documentation

## 2017-08-05 NOTE — Assessment & Plan Note (Signed)
84 y.o. female with Del 5q MDS diagnosis, currently managed supportive care risk over initiation of lenalidomide therapy based on the previous history of DVT, peptic ulcer disease, and GI bleeding in addition to multiple other comorbidities including pulmonary fibrosis, congestive heart failure, and atrial fibrillation.  On reviewing the available information, wears to missing the diagnostic studies such as results of the bone marrow biopsy as well as the molecular and cytogenetic testing obtained by my predecessor. Oral, I do agree with his management strategy this time.  Plan: --Labs as below --Resume transfusion support: 1U pRBCs for Hgb <=9.0g/dL, all products leukoreduced and irradiated; --RTC 1 week to review labs and continue supportive care

## 2017-08-05 NOTE — Progress Notes (Signed)
[facility] New Visit:  Assessment: MDS (myelodysplastic syndrome) with 5q deletion ([place]) 83 y.o. female with Del 5q MDS diagnosis, currently managed supportive care risk over initiation of lenalidomide therapy based on the previous history of DVT, peptic ulcer disease, and GI bleeding in addition to multiple other comorbidities including pulmonary fibrosis, congestive heart failure, and atrial fibrillation.  On reviewing the available information, wears to missing the diagnostic studies such as results of the bone marrow biopsy as well as the molecular and cytogenetic testing obtained by my predecessor. Oral, I do agree with his management strategy this time.  Plan: --Labs as below --Resume transfusion support: 1U pRBCs for Hgb <=9.0g/dL, all products leukoreduced and irradiated; --RTC 1 week to review labs and continue supportive care  Voice recognition software was used and creation of this note. Despite my best effort at editing the text, some misspelling/errors may have occurred.  Orders Placed This Encounter  Procedures  . CBC & Diff and Retic    Standing Status:   Future    Number of Occurrences:   1    Standing Expiration Date:   07/30/2018  . Lactate dehydrogenase (LDH)    Standing Status:   Future    Number of Occurrences:   1    Standing Expiration Date:   07/30/2018  . Comprehensive metabolic panel    Standing Status:   Future    Number of Occurrences:   1    Standing Expiration Date:   07/30/2018  . Erythropoietin    Standing Status:   Future    Number of Occurrences:   1    Standing Expiration Date:   07/30/2018  . Ferritin    Standing Status:   Future    Number of Occurrences:   1    Standing Expiration Date:   07/30/2018  . Iron and TIBC    Standing Status:   Future    Number of Occurrences:   1    Standing Expiration Date:   07/30/2018  . Folate, Serum    Standing Status:   Future    Number of Occurrences:   1    Standing Expiration Date:    07/30/2018  . Copper, serum    Standing Status:   Future    Number of Occurrences:   1    Standing Expiration Date:   07/30/2018  . TSH    Standing Status:   Future    Number of Occurrences:   1    Standing Expiration Date:   07/30/2018  . Hold Tube, Blood Bank    Standing Status:   Future    Number of Occurrences:   1    Standing Expiration Date:   07/30/2018    All questions were answered.  . The patient knows to call the clinic with any problems, questions or concerns.  This note was electronically signed.    History of Presenting Illness Kimberly Horn 83 y.o. presenting to the [facility] for Myelodysplastic syndrome with deletion 5q. The patient was previously managed by Dr. Radene Knee in [place]. Lenalidomide therapy was considered, but wasn't started due to history of peptic ulcer disease with gastrointestinal bleeding causing significant risk of anticoagulation and previous history of DVT. So far, patient was managed symptomatically with transfusions. Patient usually develops symptoms of lightheadedness, dizziness, orthostatic lightheadedness at hemoglobin 9.0g/dL, and symptoms resolved while hemoglobin is above that target.  At the present time, patient reports starting to develop the symptoms that she associates  with her anemia. She denies active chest pain, her shortness of breath is at baseline for pulmonary fibrosis diagnosis, as well as congestive heart failure. Patient denies productive cough, fevers, chills, night sweats. Abdominal pain, nausea, vomiting, diarrhea, or constipation. No dysuria or hematuria. No current skin rash.  Oncological/hematological History: --Labs, 12/21/16:                                              Vit B12 306, Folate 28.5, Fe 209, FeSat 79%, TIBC 263, Ferritin 220; --Labs, 06/06/17:                  Hgb 6.1,              Vit B12 588  --Labs, 07/23/17: WBC 4.0, Hgb 9.5, Plt 323;  Medical History: Past Medical History:  Diagnosis Date  . Anxiety   . Atrial  fibrillation ([place])   . Benign neoplasm of brain ([place])   . CHF (congestive heart failure) ([place])   . Difficulty hearing   . Diverticulitis   . DVT (deep venous thrombosis) ([place])   . Esophageal reflux   . Flu 11/04/2014  . GI bleed   . Hyperlipidemia   . Hypothyroid   . Hypothyroidism   . Low blood pressure    when hbg is bekow 9  . Myelodysplastic syndrome with 5q deletion ([place])   . Osteoarthritis 10/09/2010  . Osteoporosis 10/09/2010  . Pacemaker   . Peptic ulcer    with GERD  . Peptic ulcer   . Pulmonary embolism ([place])   . Pulmonary fibrosis [facility])     Surgical History: Past Surgical History:  Procedure Laterality Date  . ABLATION    . APPENDECTOMY    . BACK SURGERY    . BILATERAL CARPAL TUNNEL RELEASE    . CATARACT EXTRACTION, BILATERAL    . CHOLECYSTECTOMY    . ESOPHAGOGASTRODUODENOSCOPY (EGD) WITH PROPOFOL N/A 12/22/2016   Procedure: ESOPHAGOGASTRODUODENOSCOPY (EGD) WITH PROPOFOL;  Surgeon: Mauri Pole, MD;  Location: [facility];  Service: Endoscopy;  Laterality: N/A;  . HERNIA REPAIR    . PACEMAKER INSERTION    . TONSILLECTOMY    . TUBAL LIGATION Bilateral     Family History: Family History  Problem Relation Age of Onset  . Heart attack Mother   . Cancer Father        throat  . Arthritis Sister   . Lung disease Brother   . Heart disease Brother   . Cancer Brother   . Gout Brother   . Arthritis Brother   . Stroke Son   . Diabetes Son     Social History: Social History   Social History  . Marital status: Widowed    Spouse name: N/A  . Number of children: 5  . Years of education: N/A   Occupational History  . Not on file.   Social History Main Topics  . Smoking status: Never Smoker  . Smokeless tobacco: Never Used  . Alcohol use No  . Drug use: No  . Sexual activity: Not Currently   Other Topics Concern  . Not on file   Social History Narrative   Social History      Diet? I eat what I want      Do you drink/eat things with  caffeine? yes      Marital status?  widowed                        What year were you married? 1950      Do you live in a house, apartment, assisted living, condo, trailer, etc.? With family      Is it one or more stories? 2 stories but stay on 1st floor       How many persons live in your home?       Do you have any pets in your home? (please list) usually 0 (but currently 1 cat)      Highest level of education completed?       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         some                             Type & how often? When I think about it      Advanced Directives      Do you have a living will? no      Do you have a DNR form?     no                             If not, do you want to discuss one? no      Do you have signed POA/HPOA for forms?  no      Functional Status      Do you have difficulty bathing or dressing yourself?      Do you have difficulty preparing food or eating?       Do you have difficulty managing your medications?      Do you have difficulty managing your finances?      Do you have difficulty affording your medications?       Allergies: Allergies  Allergen Reactions  . Demerol [Meperidine] Other (See Comments)    INTENSIFIED FEELINGS  . Hydroxyzine Other (See Comments)    INTENSIFIED FEELINGS  . Talwin [Pentazocine] Other (See Comments)    INTENSIFIED FEELINGS    Medications:  Current Outpatient Prescriptions  Medication Sig Dispense Refill  . Calcium-Vitamin D-Vitamin K (VIACTIV PO) Take by mouth. Plus D and K 2 by mouth at lunch    . Cholecalciferol (VITAMIN D3) 5000 units CAPS Take 1 capsule by mouth daily.    . DULoxetine (CYMBALTA) 30 MG capsule Take 30 mg by mouth 2 (two) times daily.    . furosemide (LASIX) 20 MG tablet Take 20 mg by mouth as needed.     Marland Kitchen HYDROcodone-acetaminophen (NORCO/VICODIN) 5-325 MG tablet Take 1 tablet by mouth at bedtime. Only takes as needed      . levothyroxine (SYNTHROID, LEVOTHROID) 100 MCG tablet Take 100 mcg by mouth daily. In the morning    . Melatonin 10 MG CAPS Take 1 tablet by mouth daily.    . montelukast (SINGULAIR) 10 MG tablet Take 10 mg by mouth daily. In the morning    . Multiple Vitamins-Minerals (CENTRUM SILVER ULTRA MENS PO) Take by mouth daily.    Marland Kitchen omeprazole (PRILOSEC) 40 MG capsule Take 1 capsule (40 mg total) by mouth 2 (two) times daily. 60 capsule 2  . potassium chloride SA (K-DUR,KLOR-CON) 20 MEQ tablet Take 20 mEq by mouth as needed. When taking furosemide    .  sucralfate (CARAFATE) 1 GM/10ML suspension Take 1 g by mouth 2 (two) times daily.     No current facility-administered medications for this visit.     Review of Systems: Review of Systems  Musculoskeletal: Positive for neck pain.  Neurological: Positive for light-headedness.  All other systems reviewed and are negative.    PHYSICAL EXAMINATION Blood pressure (!) 121/50, pulse 91, temperature 98.2 F (36.8 C), temperature source Oral, resp. rate 16, height 5' 2.5" (1.588 m), weight 138 lb 3.2 oz (62.7 kg), SpO2 100 %.  ECOG PERFORMANCE STATUS: 2 - Symptomatic, <50% confined to bed  Physical Exam  Constitutional: She is oriented to person, place, and time and well-developed, well-nourished, and in no distress. No distress.  HENT:  Head: Normocephalic and atraumatic.  Mouth/Throat: Oropharynx is clear and moist. No oropharyngeal exudate.  Eyes: Pupils are equal, round, and reactive to light. Conjunctivae and EOM are normal. No scleral icterus.  Neck: Normal range of motion. Neck supple. No thyromegaly present.  Cardiovascular: Normal rate, regular rhythm and normal heart sounds.   No murmur heard. Pulmonary/Chest: Effort normal and breath sounds normal. No respiratory distress. She has no wheezes. She has no rales.  Abdominal: Soft. Bowel sounds are normal. She exhibits no distension and no mass. There is no tenderness. There is no rebound  and no guarding.  Musculoskeletal: Normal range of motion. She exhibits no edema.  Lymphadenopathy:    She has no cervical adenopathy.  Neurological: She is alert and oriented to person, place, and time. She has normal reflexes. No cranial nerve deficit.  Skin: Skin is warm and dry. No rash noted. She is not diaphoretic. No erythema.     LABORATORY DATA: I have personally reviewed the data as listed: Appointment on 07/30/2017  Component Date Value Ref Range Status  . WBC 07/30/2017 3.8* 3.9 - 10.3 10e3/uL Final  . NEUT# 07/30/2017 2.1  1.5 - 6.5 10e3/uL Final  . HGB 07/30/2017 9.0* 11.6 - 15.9 g/dL Final  . HCT 07/30/2017 27.3* 34.8 - 46.6 % Final  . Platelets 07/30/2017 222  145 - 400 10e3/uL Final  . MCV 07/30/2017 99.3  79.5 - 101.0 fL Final  . MCH 07/30/2017 32.7  25.1 - 34.0 pg Final  . MCHC 07/30/2017 33.0  31.5 - 36.0 g/dL Final  . RBC 07/30/2017 2.75* 3.70 - 5.45 10e6/uL Final  . RDW 07/30/2017 21.7* 11.2 - 14.5 % Final  . lymph# 07/30/2017 1.4  0.9 - 3.3 10e3/uL Final  . MONO# 07/30/2017 0.3  0.1 - 0.9 10e3/uL Final  . Eosinophils Absolute 07/30/2017 0.0  0.0 - 0.5 10e3/uL Final  . Basophils Absolute 07/30/2017 0.0  0.0 - 0.1 10e3/uL Final  . NEUT% 07/30/2017 54.5  38.4 - 76.8 % Final  . LYMPH% 07/30/2017 37.3  14.0 - 49.7 % Final  . MONO% 07/30/2017 7.1  0.0 - 14.0 % Final  . EOS% 07/30/2017 0.0  0.0 - 7.0 % Final  . BASO% 07/30/2017 1.1  0.0 - 2.0 % Final  . nRBC 07/30/2017 0  0 - 0 % Final  . Retic % 07/30/2017 2.26* 0.70 - 2.10 % Final  . Retic Ct Abs 07/30/2017 62.15  33.70 - 90.70 10e3/uL Final  . Immature Retic Fract 07/30/2017 11.90* 1.60 - 10.00 % Final  . LDH 07/30/2017 272* 125 - 245 U/L Final  . Sodium 07/30/2017 140  136 - 145 mEq/L Final  . Potassium 07/30/2017 4.4  3.5 - 5.1 mEq/L Final  . Chloride 07/30/2017 103  98 - 109 mEq/L Final  . CO2 07/30/2017 28  22 - 29 mEq/L Final  . Glucose 07/30/2017 88  70 - 140 mg/dl Final   Glucose reference range is  for nonfasting patients. Fasting glucose reference range is 70- 100.  Marland Kitchen BUN 07/30/2017 20.4  7.0 - 26.0 mg/dL Final  . Creatinine 07/30/2017 0.9  0.6 - 1.1 mg/dL Final  . Total Bilirubin 07/30/2017 0.74  0.20 - 1.20 mg/dL Final  . Alkaline Phosphatase 07/30/2017 76  40 - 150 U/L Final  . AST 07/30/2017 22  5 - 34 U/L Final  . ALT 07/30/2017 23  0 - 55 U/L Final  . Total Protein 07/30/2017 7.6  6.4 - 8.3 g/dL Final  . Albumin 07/30/2017 3.9  3.5 - 5.0 g/dL Final  . Calcium 07/30/2017 9.7  8.4 - 10.4 mg/dL Final  . Anion Gap 07/30/2017 9  3 - 11 mEq/L Final  . EGFR 07/30/2017 57* >90 ml/min/1.73 m2 Final   eGFR is calculated using the CKD-EPI Creatinine Equation (2009)  . Erythropoietin 07/30/2017 457.1* 2.6 - 18.5 mIU/mL Final   Beck[employer] Coulter UniCel DxI 800 Immunoassay System  . Ferritin 07/30/2017 1,222* 9 - 269 ng/ml Final  . Iron 07/30/2017 221* 41 - 142 ug/dL Final  . TIBC 07/30/2017 269  236 - 444 ug/dL Final  . UIBC 07/30/2017 48* 120 - 384 ug/dL Final  . %SAT 07/30/2017 82* 21 - 57 % Final  . Folate 07/30/2017 >20.0  >3.0 ng/mL Final   Comment: A serum folate concentration of less than 3.1 ng/mL is considered to represent clinical deficiency.   . Copper 07/30/2017 99  72 - 166 ug/dL Final                                                  Detection Limit = 5  . TSH 07/30/2017 0.458  0.308 - 3.960 m(IU)/L Final  Hospital Outpatient Visit on 07/30/2017  Component Date Value Ref Range Status  . ABO/RH(D) 07/30/2017 B POS   Final  . Antibody Screen 07/30/2017 POS   Final  . Sample Expiration 07/30/2017 08/02/2017   Final  . DAT, IgG 07/30/2017 NEG   Final  . Antibody Identification 76/28/3151 ANTI K   Final  . PT AG Type 07/30/2017 NEGATIVE FOR KELL ANTIGEN   Final  . Unit Number 07/30/2017 [id number]   Final  . Blood Component Type 07/30/2017 RED CELLS,LR   Final  . Unit division 07/30/2017 00   Final  . Status of Unit 07/30/2017 ISSUED,FINAL   Final  . Donor AG Type  07/30/2017 NEGATIVE FOR KELL ANTIGEN   Final  . Transfusion Status 07/30/2017 OK TO TRANSFUSE   Final  . Crossmatch Result 07/30/2017 COMPATIBLE   Final  . ISSUE DATE / TIME 07/30/2017 [id number]   Final  . Blood Product Unit Number 07/30/2017 [id number]   Final  . PRODUCT CODE 07/30/2017 [id number]   Final  . Unit Type and Rh 07/30/2017 5100   Final  . Blood Product Expiration Date 07/30/2017 [id number]   Final  . Order Confirmation 08/01/2017 ORDER PROCESSED BY BLOOD BANK   Final  Abstract on 07/30/2017  Component Date Value Ref Range Status  . HM Dexa Scan 04/18/2017 osteopenia   Final   [facility]  . Hemoglobin 12/03/2016 12.3  12.0 - 16.0 Final  .  HCT 12/03/2016 38  36 - 46 Final  . Hemoglobin 11/26/2016 9.0* 12.0 - 16.0 Final  . HCT 11/26/2016 28* 36 - 46 Final  . Platelets 11/26/2016 192  150 - 399 Final  . WBC 11/26/2016 3.6   Final  . Glucose 11/26/2016 93   Final  . BUN 11/26/2016 13  4 - 21 Final  . Creatinine 11/26/2016 1.0  0.5 - 1.1 Final  . Potassium 11/26/2016 3.3* 3.4 - 5.3 Final  . Sodium 11/26/2016 140  137 - 147 Final  . Alkaline Phosphatase 11/26/2016 56  25 - 125 Final  . ALT 11/26/2016 22  7 - 35 Final  . AST 11/26/2016 19  13 - 35 Final  . Bilirubin, Total 11/26/2016 0.5   Final  . Glucose 02/23/2016 105   Final  . BUN 02/23/2016 39* 4 - 21 Final  . Creatinine 02/23/2016 0.9  0.5 - 1.1 Final  . Potassium 02/23/2016 5.2  3.4 - 5.3 Final  . Sodium 02/23/2016 139  137 - 147 Final  . Alkaline Phosphatase 02/23/2016 45  25 - 125 Final  . ALT 02/23/2016 51* 7 - 35 Final  . AST 02/23/2016 17  13 - 35 Final  . Bilirubin, Total 02/23/2016 0.4   Final  Abstract on 07/24/2017  Component Date Value Ref Range Status  . Hemoglobin 06/12/2017 7.3* 12.0 - 16.0 Final  . HCT 06/12/2017 23* 36 - 46 Final  . Platelets 06/12/2017 225  150 - 399 Final  . WBC 06/12/2017 4.1   Final  . Glucose 06/06/2017 116   Final  . BUN 06/06/2017 25* 4 - 21  Final  . Creatinine 06/06/2017 1.0  0.5 - 1.1 Final  . Potassium 06/06/2017 4.8  3.4 - 5.3 Final  . Sodium 06/06/2017 140  137 - 147 Final  . Alkaline Phosphatase 06/06/2017 71  25 - 125 Final  . ALT 06/06/2017 25  7 - 35 Final  . AST 06/06/2017 18  13 - 35 Final  . Bilirubin, Total 06/06/2017 0.6   Final  . Vitamin B-12 06/06/2017 588   Final  . TSH 06/06/2017 2.36  0.41 - 5.90 Final  . Hemoglobin 06/04/2017 6.1* 12.0 - 16.0 Final  . HCT 06/04/2017 21* 36 - 46 Final  . Platelets 06/04/2017 268  150 - 399 Final  . WBC 06/04/2017 5.1   Final         Ardath Sax, MD

## 2017-08-06 ENCOUNTER — Ambulatory Visit (INDEPENDENT_AMBULATORY_CARE_PROVIDER_SITE_OTHER): Payer: Medicare Other

## 2017-08-06 VITALS — BP 124/54 | HR 80 | Temp 98.2°F | Ht 63.0 in | Wt 140.0 lb

## 2017-08-06 DIAGNOSIS — Z0000 Encounter for general adult medical examination without abnormal findings: Secondary | ICD-10-CM

## 2017-08-06 NOTE — Progress Notes (Signed)
Subjective:   Kimberly Horn is a 83 y.o. female who presents for a Subsequent Medicare Annual Wellness Visit.    Last AWV-11/2012    Objective:    Today's Vitals   08/06/17 1457  BP: (!) 124/54  Pulse: 80  Temp: 98.2 F (36.8 C)  TempSrc: Oral  SpO2: 98%  Weight: 140 lb (63.5 kg)  Height: 5\' 3"  (1.6 m)   Body mass index is 24.8 kg/m.   Current Medications (verified) Outpatient Encounter Prescriptions as of 08/06/2017  Medication Sig  . Calcium-Vitamin D-Vitamin K (VIACTIV PO) Take by mouth. Plus D and K 2 by mouth at lunch  . Cholecalciferol (VITAMIN D3) 5000 units CAPS Take 1 capsule by mouth daily.  . DULoxetine (CYMBALTA) 30 MG capsule Take 30 mg by mouth 2 (two) times daily.  . furosemide (LASIX) 20 MG tablet Take 20 mg by mouth as needed.   Marland Kitchen HYDROcodone-acetaminophen (NORCO/VICODIN) 5-325 MG tablet Take 1 tablet by mouth at bedtime. Only takes as needed   . levothyroxine (SYNTHROID, LEVOTHROID) 100 MCG tablet Take 100 mcg by mouth daily. In the morning  . Melatonin 10 MG CAPS Take 1 tablet by mouth daily.  . montelukast (SINGULAIR) 10 MG tablet Take 10 mg by mouth daily. In the morning  . Multiple Vitamins-Minerals (CENTRUM SILVER ULTRA MENS PO) Take by mouth daily.  Marland Kitchen omeprazole (PRILOSEC) 40 MG capsule Take 1 capsule (40 mg total) by mouth 2 (two) times daily.  . potassium chloride SA (K-DUR,KLOR-CON) 20 MEQ tablet Take 20 mEq by mouth as needed. When taking furosemide  . sucralfate (CARAFATE) 1 GM/10ML suspension Take 1 g by mouth 2 (two) times daily.   No facility-administered encounter medications on file as of 08/06/2017.     Allergies (verified) Demerol [meperidine]; Hydroxyzine; and Talwin [pentazocine]   History: Past Medical History:  Diagnosis Date  . Anxiety   . Atrial fibrillation ([place])   . Benign neoplasm of brain ([place])   . CHF (congestive heart failure) ([place])   . Difficulty hearing   . Diverticulitis   . DVT (deep venous thrombosis) ([place])   .  Esophageal reflux   . Flu 11/04/2014  . GI bleed   . Hyperlipidemia   . Hypothyroid   . Hypothyroidism   . Low blood pressure    when hbg is bekow 9  . Myelodysplastic syndrome with 5q deletion ([place])   . Osteoarthritis 10/09/2010  . Osteoporosis 10/09/2010  . Pacemaker   . Peptic ulcer    with GERD  . Peptic ulcer   . Pulmonary embolism ([place])   . Pulmonary fibrosis ([place])    Past Surgical History:  Procedure Laterality Date  . ABLATION    . APPENDECTOMY    . BACK SURGERY    . BILATERAL CARPAL TUNNEL RELEASE    . CATARACT EXTRACTION, BILATERAL    . CHOLECYSTECTOMY    . ESOPHAGOGASTRODUODENOSCOPY (EGD) WITH PROPOFOL N/A 12/22/2016   Procedure: ESOPHAGOGASTRODUODENOSCOPY (EGD) WITH PROPOFOL;  Surgeon: Mauri Pole, MD;  Location: [facility];  Service: Endoscopy;  Laterality: N/A;  . HERNIA REPAIR    . PACEMAKER INSERTION    . TONSILLECTOMY    . TUBAL LIGATION Bilateral    Family History  Problem Relation Age of Onset  . Heart attack Mother   . Cancer Father        throat  . Arthritis Sister   . Lung disease Brother   . Heart disease Brother   . Cancer Brother   . Gout  Brother   . Arthritis Brother   . Stroke Son   . Diabetes Son    Social History   Occupational History  . Not on file.   Social History Main Topics  . Smoking status: Never Smoker  . Smokeless tobacco: Never Used  . Alcohol use No  . Drug use: No  . Sexual activity: Not Currently    Tobacco Counseling Counseling given: Not Answered   Activities of Daily Living In your present state of health, do you have any difficulty performing the following activities: 08/06/2017 12/21/2016  Hearing? Tempie Donning  Vision? Y N  Difficulty concentrating or making decisions? Y N  Walking or climbing stairs? Y Y  Comment - d/t CHF  Dressing or bathing? N N  Doing errands, shopping? Y Y  Comment - d/t her CHF  Preparing Food and eating ? Y -  Using the Toilet? Y -  In the past six months, have you  accidently leaked urine? N -  Do you have problems with loss of bowel control? N -  Managing your Medications? Y -  Managing your Finances? Y -  Housekeeping or managing your Housekeeping? Y -    Immunizations and Health Maintenance Immunization History  Administered Date(s) Administered  . Influenza,inj,Quad PF,6+ Mos 07/16/2017   Health Maintenance Due  Topic Date Due  . TETANUS/TDAP  01/23/1949  . PNA vac Low Risk Adult (1 of 2 - PCV13) 01/22/1997    Patient Care Team: Gildardo Cranker, DO as PCP - General (Internal Medicine)  Indicate any recent Medical Services you may have received from other than [facility] providers in the past year (date may be approximate).     Assessment:   This is a routine wellness examination for Kimberly Horn.   Hearing/Vision screen No exam data present  Dietary issues and exercise activities discussed: Current Exercise Habits: The patient does not participate in regular exercise at present, Exercise limited by: respiratory conditions(s)  Goals    . Exercise 3x per week (30 min per time)          Start silver sneaker a couple days a week      Depression Screen PHQ 2/9 Scores 08/06/2017 07/16/2017  PHQ - 2 Score 0 0    Fall Risk Fall Risk  08/06/2017 07/16/2017  Falls in the past year? Yes Yes  Number falls in past yr: 2 or more 2 or more  Injury with Fall? Yes Yes  Comment - Fractured ribs     Cognitive Function: MMSE - Mini Mental State Exam 08/06/2017  Orientation to time 4  Orientation to Place 3  Registration 3  Attention/ Calculation 5  Recall 1  Language- name 2 objects 2  Language- repeat 1  Language- follow 3 step command 3  Language- read & follow direction 1  Write a sentence 1  Copy design 1  Total score 25        Screening Tests Health Maintenance  Topic Date Due  . TETANUS/TDAP  01/23/1949  . PNA vac Low Risk Adult (1 of 2 - PCV13) 01/22/1997  . INFLUENZA VACCINE  Completed  . DEXA SCAN  Completed      Plan:    I  have personally reviewed and addressed the Medicare Annual Wellness questionnaire and have noted the following in the patient's chart:  A. Medical and social history B. Use of alcohol, tobacco or illicit drugs  C. Current medications and supplements D. Functional ability and status E.  Nutritional status F.  Physical activity G. Advance directives H. List of other physicians I.  Hospitalizations, surgeries, and ER visits in previous 12 months J.  [place] to include hearing, vision, cognitive, depression L. Referrals and appointments - none  In addition, I have reviewed and discussed with patient certain preventive protocols, quality metrics, and best practice recommendations. A written personalized care plan for preventive services as well as general preventive health recommendations were provided to patient.  See attached scanned questionnaire for additional information.   Signed,   Rich Reining, RN Nurse Health Advisor   Quick Notes   Health Maintenance: Pt family stated she has pneumonia and tdap and will double check.     Abnormal Screen: MMSE 25/30. Passed clock drawing     Patient Concerns: Would like to discuss vitamin D     Nurse Concerns: None

## 2017-08-06 NOTE — Patient Instructions (Signed)
Kimberly Horn , Thank you for taking time to come for your Medicare Wellness Visit. I appreciate your ongoing commitment to your health goals. Please review the following plan we discussed and let me know if I can assist you in the future.   Screening recommendations/referrals: Colonoscopy excluded, you are over age 81 Mammogram excluded, you are over age 60 Bone Density up to date Recommended yearly ophthalmology/optometry visit for glaucoma screening and checkup Recommended yearly dental visit for hygiene and checkup  Vaccinations: Influenza vaccine up to date Pneumococcal vaccine up to date, per family Tdap vaccine up to date, per family Shingles vaccine due, if you decide you want this we will send the prescription to your pharmacy    Advanced directives: Advance directive discussed with you today. I have provided a copy for you to complete at home and have notarized. Once this is complete please bring a copy in to our office so we can scan it into your chart.  Conditions/risks identified: none  Next appointment: Dr. Eulas Post 09/02/17 @ 2:45pm   Preventive Care 65 Years and Older, Female Preventive care refers to lifestyle choices and visits with your health care provider that can promote health and wellness. What does preventive care include?  A yearly physical exam. This is also called an annual well check.  Dental exams once or twice a year.  Routine eye exams. Ask your health care provider how often you should have your eyes checked.  Personal lifestyle choices, including:  Daily care of your teeth and gums.  Regular physical activity.  Eating a healthy diet.  Avoiding tobacco and drug use.  Limiting alcohol use.  Practicing safe sex.  Taking low-dose aspirin every day.  Taking vitamin and mineral supplements as recommended by your health care provider. What happens during an annual well check? The services and screenings done by your health care provider during  your annual well check will depend on your age, overall health, lifestyle risk factors, and family history of disease. Counseling  Your health care provider may ask you questions about your:  Alcohol use.  Tobacco use.  Drug use.  Emotional well-being.  Home and relationship well-being.  Sexual activity.  Eating habits.  History of falls.  Memory and ability to understand (cognition).  Work and work Statistician.  Reproductive health. Screening  You may have the following tests or measurements:  Height, weight, and BMI.  Blood pressure.  Lipid and cholesterol levels. These may be checked every 5 years, or more frequently if you are over 74 years old.  Skin check.  Lung cancer screening. You may have this screening every year starting at age 30 if you have a 30-pack-year history of smoking and currently smoke or have quit within the past 15 years.  Fecal occult blood test (FOBT) of the stool. You may have this test every year starting at age 44.  Flexible sigmoidoscopy or colonoscopy. You may have a sigmoidoscopy every 5 years or a colonoscopy every 10 years starting at age 74.  Hepatitis C blood test.  Hepatitis B blood test.  Sexually transmitted disease (STD) testing.  Diabetes screening. This is done by checking your blood sugar (glucose) after you have not eaten for a while (fasting). You may have this done every 1-3 years.  Bone density scan. This is done to screen for osteoporosis. You may have this done starting at age 76.  Mammogram. This may be done every 1-2 years. Talk to your health care provider about how often you  should have regular mammograms. Talk with your health care provider about your test results, treatment options, and if necessary, the need for more tests. Vaccines  Your health care provider may recommend certain vaccines, such as:  Influenza vaccine. This is recommended every year.  Tetanus, diphtheria, and acellular pertussis (Tdap,  Td) vaccine. You may need a Td booster every 10 years.  Zoster vaccine. You may need this after age 4.  Pneumococcal 13-valent conjugate (PCV13) vaccine. One dose is recommended after age 63.  Pneumococcal polysaccharide (PPSV23) vaccine. One dose is recommended after age 18. Talk to your health care provider about which screenings and vaccines you need and how often you need them. This information is not intended to replace advice given to you by your health care provider. Make sure you discuss any questions you have with your health care provider. Document Released: 11/17/2015 Document Revised: 07/10/2016 Document Reviewed: 08/22/2015 Elsevier Interactive Patient Education  2017 [place] Prevention in the Home Falls can cause injuries. They can happen to people of all ages. There are many things you can do to make your home safe and to help prevent falls. What can I do on the outside of my home?  Regularly fix the edges of walkways and driveways and fix any cracks.  Remove anything that might make you trip as you walk through a door, such as a raised step or threshold.  Trim any bushes or trees on the path to your home.  Use bright outdoor lighting.  Clear any walking paths of anything that might make someone trip, such as rocks or tools.  Regularly check to see if handrails are loose or broken. Make sure that both sides of any steps have handrails.  Any raised decks and porches should have guardrails on the edges.  Have any leaves, snow, or ice cleared regularly.  Use sand or salt on walking paths during winter.  Clean up any spills in your garage right away. This includes oil or grease spills. What can I do in the bathroom?  Use night lights.  Install grab bars by the toilet and in the tub and shower. Do not use towel bars as grab bars.  Use non-skid mats or decals in the tub or shower.  If you need to sit down in the shower, use a plastic, non-slip  stool.  Keep the floor dry. Clean up any water that spills on the floor as soon as it happens.  Remove soap buildup in the tub or shower regularly.  Attach bath mats securely with double-sided non-slip rug tape.  Do not have throw rugs and other things on the floor that can make you trip. What can I do in the bedroom?  Use night lights.  Make sure that you have a light by your bed that is easy to reach.  Do not use any sheets or blankets that are too big for your bed. They should not hang down onto the floor.  Have a firm chair that has side arms. You can use this for support while you get dressed.  Do not have throw rugs and other things on the floor that can make you trip. What can I do in the kitchen?  Clean up any spills right away.  Avoid walking on wet floors.  Keep items that you use a lot in easy-to-reach places.  If you need to reach something above you, use a strong step stool that has a grab bar.  Keep electrical cords out  of the way.  Do not use floor polish or wax that makes floors slippery. If you must use wax, use non-skid floor wax.  Do not have throw rugs and other things on the floor that can make you trip. What can I do with my stairs?  Do not leave any items on the stairs.  Make sure that there are handrails on both sides of the stairs and use them. Fix handrails that are broken or loose. Make sure that handrails are as long as the stairways.  Check any carpeting to make sure that it is firmly attached to the stairs. Fix any carpet that is loose or worn.  Avoid having throw rugs at the top or bottom of the stairs. If you do have throw rugs, attach them to the floor with carpet tape.  Make sure that you have a light switch at the top of the stairs and the bottom of the stairs. If you do not have them, ask someone to add them for you. What else can I do to help prevent falls?  Wear shoes that:  Do not have high heels.  Have rubber bottoms.  Are  comfortable and fit you well.  Are closed at the toe. Do not wear sandals.  If you use a stepladder:  Make sure that it is fully opened. Do not climb a closed stepladder.  Make sure that both sides of the stepladder are locked into place.  Ask someone to hold it for you, if possible.  Clearly mark and make sure that you can see:  Any grab bars or handrails.  First and last steps.  Where the edge of each step is.  Use tools that help you move around (mobility aids) if they are needed. These include:  Canes.  Walkers.  Scooters.  Crutches.  Turn on the lights when you go into a dark area. Replace any light bulbs as soon as they burn out.  Set up your furniture so you have a clear path. Avoid moving your furniture around.  If any of your floors are uneven, fix them.  If there are any pets around you, be aware of where they are.  Review your medicines with your doctor. Some medicines can make you feel dizzy. This can increase your chance of falling. Ask your doctor what other things that you can do to help prevent falls. This information is not intended to replace advice given to you by your health care provider. Make sure you discuss any questions you have with your health care provider. Document Released: 08/17/2009 Document Revised: 03/28/2016 Document Reviewed: 11/25/2014 Elsevier Interactive Patient Education  2017 [employer].

## 2017-08-07 ENCOUNTER — Telehealth: Payer: Self-pay | Admitting: Hematology and Oncology

## 2017-08-07 ENCOUNTER — Other Ambulatory Visit (HOSPITAL_BASED_OUTPATIENT_CLINIC_OR_DEPARTMENT_OTHER): Payer: Medicare Other

## 2017-08-07 ENCOUNTER — Encounter: Payer: Self-pay | Admitting: Hematology and Oncology

## 2017-08-07 ENCOUNTER — Ambulatory Visit (HOSPITAL_BASED_OUTPATIENT_CLINIC_OR_DEPARTMENT_OTHER): Payer: Medicare Other | Admitting: Hematology and Oncology

## 2017-08-07 VITALS — BP 121/58 | HR 87 | Temp 98.0°F | Resp 18 | Ht 63.0 in | Wt 140.3 lb

## 2017-08-07 DIAGNOSIS — D46C Myelodysplastic syndrome with isolated del(5q) chromosomal abnormality: Secondary | ICD-10-CM | POA: Diagnosis not present

## 2017-08-07 LAB — CBC WITH DIFFERENTIAL/PLATELET
BASO%: 1 % (ref 0.0–2.0)
BASOS ABS: 0 10*3/uL (ref 0.0–0.1)
EOS ABS: 0 10*3/uL (ref 0.0–0.5)
EOS%: 0 % (ref 0.0–7.0)
HCT: 30.9 % — ABNORMAL LOW (ref 34.8–46.6)
HEMOGLOBIN: 10 g/dL — AB (ref 11.6–15.9)
LYMPH#: 1.4 10*3/uL (ref 0.9–3.3)
LYMPH%: 34.2 % (ref 14.0–49.7)
MCH: 32.3 pg (ref 25.1–34.0)
MCHC: 32.4 g/dL (ref 31.5–36.0)
MCV: 99.7 fL (ref 79.5–101.0)
MONO#: 0.3 10*3/uL (ref 0.1–0.9)
MONO%: 7.8 % (ref 0.0–14.0)
NEUT%: 57 % (ref 38.4–76.8)
NEUTROS ABS: 2.4 10*3/uL (ref 1.5–6.5)
NRBC: 1 % — AB (ref 0–0)
PLATELETS: 195 10*3/uL (ref 145–400)
RBC: 3.1 10*6/uL — ABNORMAL LOW (ref 3.70–5.45)
RDW: 21.8 % — AB (ref 11.2–14.5)
WBC: 4.1 10*3/uL (ref 3.9–10.3)

## 2017-08-07 NOTE — Telephone Encounter (Signed)
Gave patient avs and calendar per 10/4 los

## 2017-08-11 NOTE — Progress Notes (Signed)
[name]:  Assessment: MDS (myelodysplastic syndrome) with 5q deletion ([place]) 83 y.o. female with del 5q MDS diagnosis, currently managed supportive care risk over initiation of lenalidomide therapy based on the previous history of DVT, peptic ulcer disease, and GI bleeding in addition to multiple other comorbidities including pulmonary fibrosis, congestive heart failure, and atrial fibrillation.  We have received previous elevation. Report confirming the diagnosis based on the morphology of the bone marrow, cytogenetic analysis, and FISH panel. All initial assessment demonstrates elevated erythropoietin making additional erythropoietin supplementation unlikely to be effective, we'll started taking significant iron overload with ferritin exceeding 1200. Today, I have reviewed potential initiation of iron chelation therapy with the patient using Exjade. This point in time, she is hesitant to initiate the treatment and would like to continue supportive care for her MDS with serial ferritin assessment.  Plan: --CBC QWk  --Continue transfusion support: 1U pRBCs for Hgb <=9.0g/dL, all products leukoreduced and irradiated; --Labs prior to RTC including ferritin values  Presented to clinic in 1 month to review the labwork and adequacy of support measures  Voice recognition software was used and creation of this note. Despite my best effort at editing the text, some misspelling/errors may have occurred.  Orders Placed This Encounter  Procedures  . CBC with Differential    Standing Status:   Future    Number of Occurrences:   1    Standing Expiration Date:   08/07/2018  . CBC with Differential    Standing Status:   Standing    Number of Occurrences:   20    Standing Expiration Date:   08/07/2018  . CBC with Differential    Standing Status:   Future    Standing Expiration Date:   08/07/2018  . Comprehensive metabolic panel    Standing Status:   Future     Standing Expiration Date:   08/07/2018  . Lactate dehydrogenase (LDH)    Standing Status:   Future    Standing Expiration Date:   08/07/2018  . Uric acid    Standing Status:   Future    Standing Expiration Date:   08/07/2018  . Ferritin    Standing Status:   Future    Standing Expiration Date:   08/07/2018  . Iron and TIBC    Standing Status:   Future    Standing Expiration Date:   08/07/2018  . Practitioner attestation of consent    I, the ordering practitioner, attest that I have discussed with the patient the benefits, risks, side effects, alternatives, likelihood of achieving goals and potential problems during recovery for the procedure listed.    Standing Status:   Future    Standing Expiration Date:   08/07/2018    Order Specific Question:   Procedure    Answer:   Blood Product(s)  . Complete patient signature process for consent form    Standing Status:   Future    Standing Expiration Date:   08/07/2018  . Care order/instruction    Transfuse Parameters --Hgb <=9.0g/dL -- transfuse 1 unit pRBCs, irradiated    Standing Status:   Future    Standing Expiration Date:   08/07/2018  . Hold Tube, Blood Bank    Standing Status:   Future    Number of Occurrences:   1    Standing Expiration Date:   08/07/2018  . Type and screen    Standing Status:   Standing    Number of Occurrences:  20    Standing Expiration Date:   08/07/2018    All questions were answered.  . The patient knows to call the clinic with any problems, questions or concerns.  This note was electronically signed.    History of Presenting Illness Kimberly Horn is an 83 y.o. female followed in the [facility] for Myelodysplastic syndrome with deletion 5q. The patient was previously managed by Dr. Radene Knee in [place]. Lenalidomide therapy was considered, but wasn't started due to history of peptic ulcer disease with gastrointestinal bleeding causing significant risk of anticoagulation and previous history of DVT. So far, patient was  managed symptomatically with transfusions. Patient usually develops symptoms of lightheadedness, dizziness, orthostatic lightheadedness at hemoglobin 9.0g/dL, and symptoms resolved while hemoglobin is above that target.  At the present time, patient denies active chest pain, her shortness of breath is at baseline for pulmonary fibrosis diagnosis, as well as congestive heart failure. Patient denies productive cough, fevers, chills, night sweats. Abdominal pain, nausea, vomiting, diarrhea, or constipation. No dysuria or hematuria. No current skin rash.  Oncological/hematological History: --Labs, 12/21/16:                                                             Vit B12 306, Folate 28.5, Fe 209, FeSat 79%, TIBC 263, Ferritin 220; --Labs, 06/06/17:                                  Hgb 6.1,              Vit B12 588  --BM Bx, 06/07/17: Hypercellular bone marrow with 40% cellularity with megakaryocytic dysplasia and increased to renewing sideroblasts consistent with myelodysplastic syndrome. 3% atypical blasts present; CytoGen -- 46,XX,del(5)(q15q33)/46,XX; no other colon abnormalities identified; FISH -- positive for del5q & negative for any other significant abnormalities --Labs, 07/23/17: WBC 4.0,                 Hgb 9.5, Plt 323; --Labs, 07/30/17:                                                                                                      Fe 221, FeSat 82%, TIBC 269, Ferritin 1222; Epo 457; 1U pRBC transfused --Labs, 08/07/17: WBC 4.1, ANC 2.4, Hgb 10.0, Plt 195   Medical History: Past Medical History:  Diagnosis Date  . Anxiety   . Atrial fibrillation ([place])   . Benign neoplasm of brain ([place])   . CHF (congestive heart failure) ([place])   . Difficulty hearing   . Diverticulitis   . DVT (deep venous thrombosis) ([place])   . Esophageal reflux   . Flu 11/04/2014  . GI bleed   . Hyperlipidemia   . Hypothyroid   . Hypothyroidism   . Low blood pressure    when hbg is bekow 9  .  Myelodysplastic syndrome with 5q deletion ([place])   . Osteoarthritis 10/09/2010  . Osteoporosis 10/09/2010  . Pacemaker   . Peptic ulcer    with GERD  . Peptic ulcer   . Pulmonary embolism ([place])   . Pulmonary fibrosis [facility])     Surgical History: Past Surgical History:  Procedure Laterality Date  . ABLATION    . APPENDECTOMY    . BACK SURGERY    . BILATERAL CARPAL TUNNEL RELEASE    . CATARACT EXTRACTION, BILATERAL    . CHOLECYSTECTOMY    . ESOPHAGOGASTRODUODENOSCOPY (EGD) WITH PROPOFOL N/A 12/22/2016   Procedure: ESOPHAGOGASTRODUODENOSCOPY (EGD) WITH PROPOFOL;  Surgeon: Mauri Pole, MD;  Location: [facility];  Service: Endoscopy;  Laterality: N/A;  . HERNIA REPAIR    . PACEMAKER INSERTION    . TONSILLECTOMY    . TUBAL LIGATION Bilateral     Family History: Family History  Problem Relation Age of Onset  . Heart attack Mother   . Cancer Father        throat  . Arthritis Sister   . Lung disease Brother   . Heart disease Brother   . Cancer Brother   . Gout Brother   . Arthritis Brother   . Stroke Son   . Diabetes Son     Social History: Social History   Social History  . Marital status: Widowed    Spouse name: N/A  . Number of children: 5  . Years of education: N/A   Occupational History  . Not on file.   Social History Main Topics  . Smoking status: Never Smoker  . Smokeless tobacco: Never Used  . Alcohol use No  . Drug use: No  . Sexual activity: Not Currently   Other Topics Concern  . Not on file   Social History Narrative   Social History      Diet? I eat what I want      Do you drink/eat things with caffeine? yes      Marital status?            widowed                        What year were you married? 1950      Do you live in a house, apartment, assisted living, condo, trailer, etc.? With family      Is it one or more stories? 2 stories but stay on 1st floor       How many persons live in your home?       Do you have any pets in  your home? (please list) usually 0 (but currently 1 cat)      Highest level of education completed?       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         some                             Type & how often? When I think about it      Advanced Directives      Do you have a living will? no      Do you have a DNR form?     no  If not, do you want to discuss one? no      Do you have signed POA/HPOA for forms?  no      Functional Status      Do you have difficulty bathing or dressing yourself?      Do you have difficulty preparing food or eating?       Do you have difficulty managing your medications?      Do you have difficulty managing your finances?      Do you have difficulty affording your medications?       Allergies: Allergies  Allergen Reactions  . Demerol [Meperidine] Other (See Comments)    INTENSIFIED FEELINGS  . Hydroxyzine Other (See Comments)    INTENSIFIED FEELINGS  . Talwin [Pentazocine] Other (See Comments)    INTENSIFIED FEELINGS    Medications:  Current Outpatient Prescriptions  Medication Sig Dispense Refill  . Calcium-Vitamin D-Vitamin K (VIACTIV PO) Take by mouth. Plus D and K 2 by mouth at lunch    . Cholecalciferol (VITAMIN D3) 5000 units CAPS Take 1 capsule by mouth daily.    . DULoxetine (CYMBALTA) 30 MG capsule Take 30 mg by mouth 2 (two) times daily.    . furosemide (LASIX) 20 MG tablet Take 20 mg by mouth as needed.     Marland Kitchen HYDROcodone-acetaminophen (NORCO/VICODIN) 5-325 MG tablet Take 1 tablet by mouth at bedtime. Only takes as needed     . levothyroxine (SYNTHROID, LEVOTHROID) 100 MCG tablet Take 100 mcg by mouth daily. In the morning    . Melatonin 10 MG CAPS Take 1 tablet by mouth daily.    . montelukast (SINGULAIR) 10 MG tablet Take 10 mg by mouth daily. In the morning    . Multiple Vitamins-Minerals (CENTRUM SILVER ULTRA MENS PO) Take by mouth daily.    Marland Kitchen omeprazole  (PRILOSEC) 40 MG capsule Take 1 capsule (40 mg total) by mouth 2 (two) times daily. 60 capsule 2  . potassium chloride SA (K-DUR,KLOR-CON) 20 MEQ tablet Take 20 mEq by mouth as needed. When taking furosemide    . sucralfate (CARAFATE) 1 GM/10ML suspension Take 1 g by mouth 2 (two) times daily.     No current facility-administered medications for this visit.     Review of Systems: Review of Systems  Musculoskeletal: Positive for neck pain.  Neurological: Positive for light-headedness.  All other systems reviewed and are negative.    PHYSICAL EXAMINATION Blood pressure (!) 121/58, pulse 87, temperature 98 F (36.7 C), temperature source Oral, resp. rate 18, height 5\' 3"  (1.6 m), weight 140 lb 4.8 oz (63.6 kg), SpO2 98 %, peak flow (!) 3 L/min.  ECOG PERFORMANCE STATUS: 2 - Symptomatic, <50% confined to bed  Physical Exam  Constitutional: She is oriented to person, place, and time and well-developed, well-nourished, and in no distress. No distress.  HENT:  Head: Normocephalic and atraumatic.  Mouth/Throat: Oropharynx is clear and moist. No oropharyngeal exudate.  Eyes: Pupils are equal, round, and reactive to light. Conjunctivae and EOM are normal. No scleral icterus.  Neck: Normal range of motion. Neck supple. No thyromegaly present.  Cardiovascular: Normal rate, regular rhythm and normal heart sounds.   No murmur heard. Pulmonary/Chest: Effort normal and breath sounds normal. No respiratory distress. She has no wheezes. She has no rales.  Abdominal: Soft. Bowel sounds are normal. She exhibits no distension and no mass. There is no tenderness. There is no rebound and no guarding.  Musculoskeletal: Normal range of motion. She exhibits no edema.  Lymphadenopathy:    She has no cervical adenopathy.  Neurological: She is alert and oriented to person, place, and time. She has normal reflexes. No cranial nerve deficit.  Skin: Skin is warm and dry. No rash noted. She is not diaphoretic. No  erythema.     LABORATORY DATA: I have personally reviewed the data as listed: Appointment on 08/07/2017  Component Date Value Ref Range Status  . WBC 08/07/2017 4.1  3.9 - 10.3 10e3/uL Final  . NEUT# 08/07/2017 2.4  1.5 - 6.5 10e3/uL Final  . HGB 08/07/2017 10.0* 11.6 - 15.9 g/dL Final  . HCT 08/07/2017 30.9* 34.8 - 46.6 % Final  . Platelets 08/07/2017 195  145 - 400 10e3/uL Final  . MCV 08/07/2017 99.7  79.5 - 101.0 fL Final  . MCH 08/07/2017 32.3  25.1 - 34.0 pg Final  . MCHC 08/07/2017 32.4  31.5 - 36.0 g/dL Final  . RBC 08/07/2017 3.10* 3.70 - 5.45 10e6/uL Final  . RDW 08/07/2017 21.8* 11.2 - 14.5 % Final  . lymph# 08/07/2017 1.4  0.9 - 3.3 10e3/uL Final  . MONO# 08/07/2017 0.3  0.1 - 0.9 10e3/uL Final  . Eosinophils Absolute 08/07/2017 0.0  0.0 - 0.5 10e3/uL Final  . Basophils Absolute 08/07/2017 0.0  0.0 - 0.1 10e3/uL Final  . NEUT% 08/07/2017 57.0  38.4 - 76.8 % Final  . LYMPH% 08/07/2017 34.2  14.0 - 49.7 % Final  . MONO% 08/07/2017 7.8  0.0 - 14.0 % Final  . EOS% 08/07/2017 0.0  0.0 - 7.0 % Final  . BASO% 08/07/2017 1.0  0.0 - 2.0 % Final  . nRBC 08/07/2017 1* 0 - 0 % Final  Abstract on 08/01/2017  Component Date Value Ref Range Status  . Platelets 05/09/2016 168  150 - 399 Final  . WBC 05/09/2016 3.5   Final  . Glucose 05/09/2016 88   Final  . BUN 05/09/2016 9  4 - 21 Final  . Creatinine 05/09/2016 0.8  0.5 - 1.1 Final  . Potassium 05/09/2016 3.8  3.4 - 5.3 Final  . Sodium 05/09/2016 144  137 - 147 Final  . Alkaline Phosphatase 05/09/2016 49  25 - 125 Final  . ALT 05/09/2016 21  7 - 35 Final  . AST 05/09/2016 19  13 - 35 Final  . Bilirubin, Total 05/09/2016 0.6   Final       Ardath Sax, MD

## 2017-08-11 NOTE — Assessment & Plan Note (Signed)
83 y.o. female with del 5q MDS diagnosis, currently managed supportive care risk over initiation of lenalidomide therapy based on the previous history of DVT, peptic ulcer disease, and GI bleeding in addition to multiple other comorbidities including pulmonary fibrosis, congestive heart failure, and atrial fibrillation.  We have received previous elevation. Report confirming the diagnosis based on the morphology of the bone marrow, cytogenetic analysis, and FISH panel. All initial assessment demonstrates elevated erythropoietin making additional erythropoietin supplementation unlikely to be effective, we'll started taking significant iron overload with ferritin exceeding 1200. Today, I have reviewed potential initiation of iron chelation therapy with the patient using Exjade. This point in time, she is hesitant to initiate the treatment and would like to continue supportive care for her MDS with serial ferritin assessment.  Plan: --CBC QWk  --Continue transfusion support: 1U pRBCs for Hgb <=9.0g/dL, all products leukoreduced and irradiated; --Labs prior to RTC including ferritin values

## 2017-08-13 ENCOUNTER — Other Ambulatory Visit: Payer: Self-pay | Admitting: *Deleted

## 2017-08-13 MED ORDER — SUCRALFATE 1 GM/10ML PO SUSP: 1.0000 g | Freq: Two times a day (BID) | ORAL | 5 refills | Status: DC

## 2017-08-13 MED ORDER — DULOXETINE HCL 30 MG PO CPEP: 30.0000 mg | ORAL_CAPSULE | Freq: Two times a day (BID) | ORAL | 5 refills | Status: DC

## 2017-08-13 NOTE — Telephone Encounter (Signed)
Kimberly Horn, daughter in law requested Rx to be faxed to pharmacy.

## 2017-08-14 ENCOUNTER — Other Ambulatory Visit: Payer: Self-pay

## 2017-08-14 ENCOUNTER — Telehealth: Payer: Self-pay

## 2017-08-14 ENCOUNTER — Ambulatory Visit (HOSPITAL_COMMUNITY)
Admission: RE | Admit: 2017-08-14 | Discharge: 2017-08-14 | Disposition: A | Discharge status: Home / Self Care | Payer: Medicare Other | Source: Ambulatory Visit | Attending: Hematology and Oncology | Admitting: Hematology and Oncology

## 2017-08-14 ENCOUNTER — Other Ambulatory Visit (HOSPITAL_BASED_OUTPATIENT_CLINIC_OR_DEPARTMENT_OTHER): Payer: Medicare Other

## 2017-08-14 DIAGNOSIS — D46C Myelodysplastic syndrome with isolated del(5q) chromosomal abnormality: Secondary | ICD-10-CM | POA: Diagnosis not present

## 2017-08-14 DIAGNOSIS — D649 Anemia, unspecified: Secondary | ICD-10-CM

## 2017-08-14 LAB — CBC WITH DIFFERENTIAL/PLATELET
BASO%: 1.4 % (ref 0.0–2.0)
Basophils Absolute: 0.1 10*3/uL (ref 0.0–0.1)
EOS%: 0 % (ref 0.0–7.0)
Eosinophils Absolute: 0 10*3/uL (ref 0.0–0.5)
HCT: 27.2 % — ABNORMAL LOW (ref 34.8–46.6)
HEMOGLOBIN: 8.9 g/dL — AB (ref 11.6–15.9)
LYMPH#: 1.3 10*3/uL (ref 0.9–3.3)
LYMPH%: 36.8 % (ref 14.0–49.7)
MCH: 33.3 pg (ref 25.1–34.0)
MCHC: 32.7 g/dL (ref 31.5–36.0)
MCV: 101.9 fL — ABNORMAL HIGH (ref 79.5–101.0)
MONO#: 0.3 10*3/uL (ref 0.1–0.9)
MONO%: 7.5 % (ref 0.0–14.0)
NEUT#: 1.9 10*3/uL (ref 1.5–6.5)
NEUT%: 54.3 % (ref 38.4–76.8)
Platelets: 244 10*3/uL (ref 145–400)
RBC: 2.67 10*6/uL — ABNORMAL LOW (ref 3.70–5.45)
RDW: 23.4 % — AB (ref 11.2–14.5)
WBC: 3.5 10*3/uL — ABNORMAL LOW (ref 3.9–10.3)
nRBC: 0 % (ref 0–0)

## 2017-08-14 LAB — PREPARE RBC (CROSSMATCH)

## 2017-08-15 ENCOUNTER — Ambulatory Visit (HOSPITAL_BASED_OUTPATIENT_CLINIC_OR_DEPARTMENT_OTHER): Payer: Medicare Other

## 2017-08-15 ENCOUNTER — Other Ambulatory Visit: Payer: Self-pay

## 2017-08-15 ENCOUNTER — Telehealth: Payer: Self-pay | Admitting: Hematology

## 2017-08-15 DIAGNOSIS — D46C Myelodysplastic syndrome with isolated del(5q) chromosomal abnormality: Secondary | ICD-10-CM

## 2017-08-15 DIAGNOSIS — D649 Anemia, unspecified: Secondary | ICD-10-CM

## 2017-08-15 LAB — PREPARE RBC (CROSSMATCH)

## 2017-08-15 MED ORDER — SODIUM CHLORIDE 0.9 % IV SOLN
250.0000 mL | Freq: Once | INTRAVENOUS | Status: AC
  Administered 2017-08-15: 250 mL via INTRAVENOUS

## 2017-08-15 MED ORDER — FUROSEMIDE 10 MG/ML IJ SOLN
INTRAMUSCULAR | Status: AC
  Filled 2017-08-15: qty 2

## 2017-08-15 MED ORDER — FUROSEMIDE 10 MG/ML IJ SOLN
20.0000 mg | Freq: Once | INTRAMUSCULAR | Status: AC
  Administered 2017-08-15: 20 mg via INTRAVENOUS

## 2017-08-15 MED ORDER — ACETAMINOPHEN 325 MG PO TABS
650.0000 mg | ORAL_TABLET | Freq: Once | ORAL | Status: AC
  Administered 2017-08-15: 650 mg via ORAL

## 2017-08-15 MED ORDER — ACETAMINOPHEN 325 MG PO TABS
ORAL_TABLET | ORAL | Status: AC
  Filled 2017-08-15: qty 2

## 2017-08-15 NOTE — Progress Notes (Signed)
Pt able to receive 1U PRBC in Symptom Management today. Appt in infusion tomorrow (08/16/17) cancelled.

## 2017-08-15 NOTE — Patient Instructions (Signed)
Blood Transfusion, Adult, Care After This sheet gives you information about how to care for yourself after your procedure. Your health care provider may also give you more specific instructions. If you have problems or questions, contact your health care provider. What can I expect after the procedure? After your procedure, it is common to have:  Bruising and soreness where the IV tube was inserted.  Headache.  Follow these instructions at home:  Take over-the-counter and prescription medicines only as told by your health care provider.  Return to your normal activities as told by your health care provider.  Follow instructions from your health care provider about how to take care of your IV insertion site. Make sure you: ? Wash your hands with soap and water before you change your bandage (dressing). If soap and water are not available, use hand sanitizer. ? Change your dressing as told by your health care provider.  Check your IV insertion site every day for signs of infection. Check for: ? More redness, swelling, or pain. ? More fluid or blood. ? Warmth. ? Pus or a bad smell. Contact a health care provider if:  You have more redness, swelling, or pain around the IV insertion site.  You have more fluid or blood coming from the IV insertion site.  Your IV insertion site feels warm to the touch.  You have pus or a bad smell coming from the IV insertion site.  Your urine turns pink, red, or brown.  You feel weak after doing your normal activities. Get help right away if:  You have signs of a serious allergic or immune system reaction, including: ? Itchiness. ? Hives. ? Trouble breathing. ? Anxiety. ? Chest or lower back pain. ? Fever, flushing, and chills. ? Rapid pulse. ? Rash. ? Diarrhea. ? Vomiting. ? Dark urine. ? Serious headache. ? Dizziness. ? Stiff neck. ? Yellow coloration of the face or the white parts of the eyes (jaundice). This information is not  intended to replace advice given to you by your health care provider. Make sure you discuss any questions you have with your health care provider. Document Released: 11/11/2014 Document Revised: 06/19/2016 Document Reviewed: 05/06/2016 Elsevier Interactive Patient Education  2018 Elsevier Inc.  

## 2017-08-15 NOTE — Telephone Encounter (Signed)
Scheduled appt per 10/11 sch message - patient to get new schedule next visit.

## 2017-08-15 NOTE — Telephone Encounter (Signed)
Kimberly Horn CBC and Type and Screen drawn today. No appt for blood following labs. Kimberly Horn Hgb 8.9. Dr. Lebron Conners would like 1U PRBC transfused. No availability in afternoon 10/11 or Infusion/Sickle Cell on 10/12. Kimberly Horn added 0830 on 10/13 in infusion. Kimberly Horn daughter verbalized concern about the time. Reinforced need to arrive no later than 0930. Kimberly Horn and daughter reminded to keep blue bracelet on. [facility] called and confirmed with admitting. Blood Bank notified of 1U PRBC on Saturday that needs to be leukocyte reduced and irradiated. Blood bank staff verbalized agreement.

## 2017-08-15 NOTE — Progress Notes (Signed)
Tolerated blood transfusion well. Voided x3 during transfusion. Discharged home with son and daughter-in-law.

## 2017-08-18 LAB — TYPE AND SCREEN
ABO/RH(D): B POS
Antibody Screen: POSITIVE
DAT, IgG: NEGATIVE
Donor AG Type: NEGATIVE
Donor AG Type: NEGATIVE
Unit division: 0
Unit division: 0

## 2017-08-18 LAB — BPAM RBC
BLOOD PRODUCT EXPIRATION DATE: 201810312359
Blood Product Expiration Date: 201810302359
ISSUE DATE / TIME: 201810121606
UNIT TYPE AND RH: 7300
Unit Type and Rh: 7300

## 2017-08-20 ENCOUNTER — Other Ambulatory Visit: Payer: Self-pay | Admitting: Hematology and Oncology

## 2017-08-20 DIAGNOSIS — Z45018 Encounter for adjustment and management of other part of cardiac pacemaker: Secondary | ICD-10-CM | POA: Diagnosis not present

## 2017-08-20 DIAGNOSIS — T451X5A Adverse effect of antineoplastic and immunosuppressive drugs, initial encounter: Principal | ICD-10-CM | POA: Insufficient documentation

## 2017-08-20 DIAGNOSIS — I480 Paroxysmal atrial fibrillation: Secondary | ICD-10-CM | POA: Diagnosis not present

## 2017-08-20 DIAGNOSIS — D701 Agranulocytosis secondary to cancer chemotherapy: Secondary | ICD-10-CM | POA: Insufficient documentation

## 2017-08-21 ENCOUNTER — Other Ambulatory Visit: Payer: Medicare Other

## 2017-08-26 DIAGNOSIS — Z45018 Encounter for adjustment and management of other part of cardiac pacemaker: Secondary | ICD-10-CM | POA: Diagnosis not present

## 2017-08-26 DIAGNOSIS — I4891 Unspecified atrial fibrillation: Secondary | ICD-10-CM | POA: Diagnosis not present

## 2017-08-28 ENCOUNTER — Ambulatory Visit: Payer: Medicare Other

## 2017-08-28 ENCOUNTER — Other Ambulatory Visit (HOSPITAL_BASED_OUTPATIENT_CLINIC_OR_DEPARTMENT_OTHER): Payer: Medicare Other

## 2017-08-28 DIAGNOSIS — D46C Myelodysplastic syndrome with isolated del(5q) chromosomal abnormality: Secondary | ICD-10-CM

## 2017-08-28 LAB — CBC WITH DIFFERENTIAL/PLATELET
BASO%: 2.3 % — ABNORMAL HIGH (ref 0.0–2.0)
BASOS ABS: 0.1 10*3/uL (ref 0.0–0.1)
EOS ABS: 0 10*3/uL (ref 0.0–0.5)
EOS%: 0.1 % (ref 0.0–7.0)
HEMATOCRIT: 30.3 % — AB (ref 34.8–46.6)
HGB: 10.1 g/dL — ABNORMAL LOW (ref 11.6–15.9)
LYMPH#: 1.4 10*3/uL (ref 0.9–3.3)
LYMPH%: 28.5 % (ref 14.0–49.7)
MCH: 33 pg (ref 25.1–34.0)
MCHC: 33.2 g/dL (ref 31.5–36.0)
MCV: 99.2 fL (ref 79.5–101.0)
MONO#: 0.6 10*3/uL (ref 0.1–0.9)
MONO%: 11.5 % (ref 0.0–14.0)
NEUT%: 57.6 % (ref 38.4–76.8)
NEUTROS ABS: 2.9 10*3/uL (ref 1.5–6.5)
Platelets: 249 10*3/uL (ref 145–400)
RBC: 3.05 10*6/uL — ABNORMAL LOW (ref 3.70–5.45)
RDW: 26.8 % — ABNORMAL HIGH (ref 11.2–14.5)
WBC: 4.9 10*3/uL (ref 3.9–10.3)

## 2017-08-28 NOTE — Progress Notes (Signed)
No PRBC today per parameters on Dr Clydene Laming note. Hgb 10.1 today, asymptomatic. Used portable oximeter, as well as dinamap, unable to get accurate pulse or O2 reading. Pt's hands arthritic and cold. Pt was asymptomatic, normal color, mentation and not SOB. Pt to bring pulse oximeter from home to next visit, as it works well on pt. Pt D/C in stable condition.

## 2017-09-02 ENCOUNTER — Encounter: Payer: Self-pay | Admitting: Internal Medicine

## 2017-09-02 ENCOUNTER — Ambulatory Visit (INDEPENDENT_AMBULATORY_CARE_PROVIDER_SITE_OTHER): Payer: Medicare Other | Admitting: Internal Medicine

## 2017-09-02 VITALS — BP 122/70 | HR 83 | Temp 97.5°F | Ht 63.0 in | Wt 139.0 lb

## 2017-09-02 DIAGNOSIS — D46C Myelodysplastic syndrome with isolated del(5q) chromosomal abnormality: Secondary | ICD-10-CM

## 2017-09-02 DIAGNOSIS — R197 Diarrhea, unspecified: Secondary | ICD-10-CM | POA: Diagnosis not present

## 2017-09-02 DIAGNOSIS — D619 Aplastic anemia, unspecified: Secondary | ICD-10-CM

## 2017-09-02 DIAGNOSIS — M810 Age-related osteoporosis without current pathological fracture: Secondary | ICD-10-CM

## 2017-09-02 DIAGNOSIS — Z79899 Other long term (current) drug therapy: Secondary | ICD-10-CM | POA: Diagnosis not present

## 2017-09-02 DIAGNOSIS — H9193 Unspecified hearing loss, bilateral: Secondary | ICD-10-CM

## 2017-09-02 DIAGNOSIS — R1084 Generalized abdominal pain: Secondary | ICD-10-CM

## 2017-09-02 DIAGNOSIS — G894 Chronic pain syndrome: Secondary | ICD-10-CM

## 2017-09-02 DIAGNOSIS — R1032 Left lower quadrant pain: Secondary | ICD-10-CM

## 2017-09-02 DIAGNOSIS — L309 Dermatitis, unspecified: Secondary | ICD-10-CM

## 2017-09-02 MED ORDER — POTASSIUM CHLORIDE CRYS ER 20 MEQ PO TBCR: 20.0000 meq | EXTENDED_RELEASE_TABLET | ORAL | 2 refills | Status: DC | PRN

## 2017-09-02 NOTE — Patient Instructions (Addendum)
Will call with lab results and referral to audiology  Will call with CT abdomen/pelvis appt to eval for colitis  Will consider changing prilosec to protonix once CT done and stool sample results done  Will call with Prolia information  Use OTC hydrocortisone cream to left ear 2 times daily x 2 weeks for dermatitis  Continue other medications as ordered  Follow up with specialists as scheduled  Follow up in 2 mos for chronic pain, diarrhea, anemia, GERD and osteoporosis  Keeping You Healthy  Get These Tests  Blood Pressure- Have your blood pressure checked by your healthcare provider at least once a year.  Normal blood pressure is 120/80.  Weight- Have your body mass index (BMI) calculated to screen for obesity.  BMI is a measure of body fat based on height and weight.  You can calculate your own BMI at [employer].it  Cholesterol- Have your cholesterol checked every year.  Diabetes- Have your blood sugar checked every year if you have high blood pressure, high cholesterol, a family history of diabetes or if you are overweight.  Pap Test - Have a pap test every 1 to 5 years if you have been sexually active.  If you are older than 65 and recent pap tests have been normal you may not need additional pap tests.  In addition, if you have had a hysterectomy  for benign disease additional pap tests are not necessary.  Mammogram-Yearly mammograms are essential for early detection of breast cancer  Screening for Colon Cancer- Colonoscopy starting at age 48. Screening may begin sooner depending on your family history and other health conditions.  Follow up colonoscopy as directed by your Gastroenterologist.  Screening for Osteoporosis- Screening begins at age 35 with bone density scanning, sooner if you are at higher risk for developing Osteoporosis.  Get these medicines  Calcium with Vitamin D- Your body requires 1200-1500 mg of Calcium a day and [phone] IU of Vitamin D a  day.  You can only absorb 500 mg of Calcium at a time therefore Calcium must be taken in 2 or 3 separate doses throughout the day.  Hormones- Hormone therapy has been associated with increased risk for certain cancers and heart disease.  Talk to your healthcare provider about if you need relief from menopausal symptoms.  Aspirin- Ask your healthcare provider about taking Aspirin to prevent Heart Disease and Stroke.  Get these Immuniztions  Flu shot- Every fall  Pneumonia shot- Once after the age of 7; if you are younger ask your healthcare provider if you need a pneumonia shot.  Tetanus- Every ten years.  Zostavax- Once after the age of 43 to prevent shingles.  Take these steps  Don't smoke- Your healthcare provider can help you quit. For tips on how to quit, ask your healthcare provider or go to www.smokefree.gov or call 1-800 QUIT-NOW.  Be physically active- Exercise 5 days a week for a minimum of 30 minutes.  If you are not already physically active, start slow and gradually work up to 30 minutes of moderate physical activity.  Try walking, dancing, bike riding, swimming, etc.  Eat a healthy diet- Eat a variety of healthy foods such as fruits, vegetables, whole grains, low fat milk, low fat cheeses, yogurt, lean meats, chicken, fish, eggs, dried beans, tofu, etc.  For more information go to www.thenutritionsource.org  Dental visit- Brush and floss teeth twice daily; visit your dentist twice a year.  Eye exam- Visit your Optometrist or Ophthalmologist yearly.  Drink alcohol in  moderation- Limit alcohol intake to one drink or less a day.  Never drink and drive.  Depression- Your emotional health is as important as your physical health.  If you're feeling down or losing interest in things you normally enjoy, please talk to your healthcare provider.  Seat Belts- can save your life; always wear one  Smoke/Carbon Monoxide detectors- These detectors need to be installed on the  appropriate level of your home.  Replace batteries at least once a year.  Violence- If anyone is threatening or hurting you, please tell your healthcare provider.  Living Will/ Health care power of attorney- Discuss with your healthcare provider and family.

## 2017-09-02 NOTE — Progress Notes (Signed)
Patient ID: Kimberly Horn, female   DOB: [birth year], 83 y.o.   MRN: [id number]   Location:      Place of Service:    Provider: Dr. Eulas Post  Patient Care Team: Gildardo Cranker, DO as PCP - General (Internal Medicine)  Extended Emergency Contact Information Primary Emergency Contact: Meininger,[name] Address: [address], [place] [address] [place] Phone: [phone] Mobile Phone: [phone] Relation: Son Secondary Emergency Contact: Alegria,[name] Address: [address]  [address] [place] Phone: [phone] Mobile Phone: [phone] Relation: Other  Code Status:  Goals of Care: Advanced Directive information Advanced Directives 08/06/2017  Does Patient Have a Medical Advance Directive? No  Type of Advance Directive -  Does patient want to make changes to medical advance directive? -  Copy of [place] in Chart? -  Would patient like information on creating a medical advance directive? Yes (MAU/Ambulatory/Procedural Areas - Information given)     Chief Complaint  Patient presents with  . Medical Management of Chronic Issues    Extended visit, AWV completed on 08/06/17 (25/30 MMSE, passed clock drawing), - depression, + Fall risk( will try silver sneakers), and EKG up to date  . Medication Refill    Refill potassium and prilosec     HPI: Patient is a 83 y.o. female seen in today for extended visit. Has question about vitamin D. She wants to know if she can just take vitamin D and Calcium. Dr. Lebron Conners, the patient's hematologist, thinks Phyllicia may need to reduce her vitamin D down to 2500 units due to increased fall risks.   She states that she has been unable to complete Cologard due to bleeding hemorrhoids and that she has diarrhea.  Hematologist has done some ferratin levels and it was at 1222.   PAF - s/p pacer. eliquis stopped 2/2 anemia.  CHF - on prn lasix with KCl. Last  2D echo unknown. Followed by cardio in [place], [place]  Idiopathic pulmonary fibrosis - O2 dependent @ 3L/min and increases to 4L/min with moderate exertion. Takes singulair. Has seen pulmonary in the past  Seasonal allergy - takes singulair. She has allergy to dust and sycamore trees.  Anemia/MDS with 5q deletion - requires frequent PRBCs 2/2 GI bleeds. She had BM bx about 1 month ago that revealed MDS with 5q deletion  Hypothyroidism - takes levothyroxine   PE/DVT hx - she does not have an IVC filter. eliquis stopped 2/2 anemia  GERD - stable on omeprazole and carafate. Belching controlled  Chronic pain syndrome - due to back. She has a hx back sx. Takes norco qhs and daily cymbalta  Insomnia - problems falling asleep. stable on melatonin 10mg   Osteoporosis - takes calcium and Vit D. She was taking prolia injections and states she was due 2 mos ago.  She has a daughter, [name], who is an Regulatory affairs officer in [place], [place]. 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    Depression screen [facility] 2/9 09/02/2017 08/06/2017 07/16/2017  Decreased Interest 0 0 0  Down, Depressed, Hopeless 0 0 0  PHQ - 2 Score 0 0 0    Fall Risk  09/02/2017 08/06/2017 07/16/2017  Falls in the past year? Yes Yes Yes  Number falls in past yr: 2 or more 2 or more 2 or more  Injury with Fall? Yes Yes  Yes  Comment - - Fractured ribs    MMSE - Mini Mental State Exam 08/06/2017  Orientation to time 4  Orientation to Place 3  Registration 3  Attention/ Calculation 5  Recall 1  Language- name 2 objects 2  Language- repeat 1  Language- follow 3 step command 3  Language- read & follow direction 1  Write a sentence 1  Copy design 1  Total score 25     Health Maintenance  Topic Date Due  . TETANUS/TDAP  01/23/1949  . PNA vac Low Risk Adult (1 of 2 - PCV13) 01/22/1997  . INFLUENZA VACCINE  Completed  . DEXA SCAN  Completed    Urinary incontinence? Functional  Status Survey:   Exercise? Diet? No exam data present Hearing:   Dentition: Pain:  Past Medical History:  Diagnosis Date  . Anxiety   . Atrial fibrillation ([place])   . Benign neoplasm of brain ([place])   . CHF (congestive heart failure) ([place])   . Difficulty hearing   . Diverticulitis   . DVT (deep venous thrombosis) ([place] [facility])   . Esophageal reflux   . Flu 11/04/2014  . GI bleed   . Hyperlipidemia   . Hypothyroid   . Hypothyroidism   . Low blood pressure    when hbg is bekow 9  . Myelodysplastic syndrome with 5q deletion ([place])   . Osteoarthritis 10/09/2010  . Osteoporosis 10/09/2010  . Pacemaker   . Peptic ulcer    with GERD  . Peptic ulcer   . Pulmonary embolism ([place])   . Pulmonary fibrosis ([place])     Past Surgical History:  Procedure Laterality Date  . ABLATION    . APPENDECTOMY    . BACK SURGERY    . BILATERAL CARPAL TUNNEL RELEASE    . CATARACT EXTRACTION, BILATERAL    . CHOLECYSTECTOMY    . ESOPHAGOGASTRODUODENOSCOPY (EGD) WITH PROPOFOL N/A 12/22/2016   Procedure: ESOPHAGOGASTRODUODENOSCOPY (EGD) WITH PROPOFOL;  Surgeon: Mauri Pole, MD;  Location: [facility];  Service: Endoscopy;  Laterality: N/A;  . HERNIA REPAIR    . PACEMAKER INSERTION    . TONSILLECTOMY    . TUBAL LIGATION Bilateral     Family History  Problem Relation Age of Onset  . Heart attack Mother   . Cancer Father        throat  . Arthritis Sister   . Lung disease Brother   . Heart disease Brother   . Cancer Brother   . Gout Brother   . Arthritis Brother   . Stroke Son   . Diabetes Son     Social History   Social History  . Marital status: Widowed    Spouse name: N/A  . Number of children: 5  . Years of education: N/A   Social History Main Topics  . Smoking status: Never Smoker  . Smokeless tobacco: Never Used  . Alcohol use No  . Drug use: No  . Sexual activity: Not Currently   Other Topics Concern  . None   Social History Narrative   Social History      Diet? I  eat what I want      Do you drink/eat things with caffeine? yes      Marital status?            widowed                        What year were you married? [place]  Do you live in a house, apartment, assisted living, condo, trailer, etc.? With family      Is it one or more stories? 2 stories but stay on 1st floor       How many persons live in your home?       Do you have any pets in your home? (please list) usually 0 (but currently 1 cat)      Highest level of education completed?       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         some                             Type & how often? When I think about it      Advanced Directives      Do you have a living will? no      Do you have a DNR form?     no                             If not, do you want to discuss one? no      Do you have signed POA/HPOA for forms?  no      Functional Status      Do you have difficulty bathing or dressing yourself?      Do you have difficulty preparing food or eating?       Do you have difficulty managing your medications?      Do you have difficulty managing your finances?      Do you have difficulty affording your medications?       reports that she has never smoked. She has never used smokeless tobacco. She reports that she does not drink alcohol or use drugs.   Allergies  Allergen Reactions  . Demerol [Meperidine] Other (See Comments)    INTENSIFIED FEELINGS  . Hydroxyzine Other (See Comments)    INTENSIFIED FEELINGS  . Talwin [Pentazocine] Other (See Comments)    INTENSIFIED FEELINGS    Outpatient Encounter Prescriptions as of 09/02/2017  Medication Sig  . Calcium-Vitamin D-Vitamin K (VIACTIV PO) Take by mouth. Plus D and K 2 by mouth at lunch  . Cholecalciferol (VITAMIN D3) 5000 units CAPS Take 1 capsule by mouth daily.  . DULoxetine (CYMBALTA) 30 MG capsule Take 1 capsule (30 mg total) by mouth 2 (two) times daily.  .  furosemide (LASIX) 20 MG tablet Take 20 mg by mouth as needed.   Marland Kitchen HYDROcodone-acetaminophen (NORCO/VICODIN) 5-325 MG tablet Take 1 tablet by mouth at bedtime. Only takes as needed   . levothyroxine (SYNTHROID, LEVOTHROID) 100 MCG tablet Take 100 mcg by mouth daily. In the morning  . Melatonin 10 MG CAPS Take 1 tablet by mouth daily.  . montelukast (SINGULAIR) 10 MG tablet Take 10 mg by mouth daily. In the morning  . Multiple Vitamins-Minerals (CENTRUM SILVER ULTRA MENS PO) Take by mouth daily.  Marland Kitchen omeprazole (PRILOSEC) 40 MG capsule Take 1 capsule (40 mg total) by mouth 2 (two) times daily.  . potassium chloride SA (K-DUR,KLOR-CON) 20 MEQ tablet Take 20 mEq by mouth as needed. When taking furosemide  . sucralfate (CARAFATE) 1 GM/10ML suspension Take 10 mLs (1 g total) by mouth 2 (two) times daily.   No facility-administered encounter medications on file as of 09/02/2017.  Review of Systems:  Review of Systems  Constitutional: Negative for activity change, appetite change, chills, diaphoresis, fatigue, fever and unexpected weight change.  HENT: Positive for hearing loss (wears heaing aids). Negative for congestion, dental problem, drooling, ear discharge, ear pain, facial swelling, mouth sores, nosebleeds, postnasal drip, rhinorrhea, sinus pain, sinus pressure, sneezing, sore throat, trouble swallowing and voice change.   Eyes: Negative for photophobia, pain, discharge, redness, itching and visual disturbance.  Respiratory: Negative for apnea, cough, choking, chest tightness, shortness of breath, wheezing and stridor.   Cardiovascular: Negative for chest pain, palpitations and leg swelling.  Gastrointestinal: Positive for anal bleeding (States hemirrhoids). Negative for abdominal distention, abdominal pain, blood in stool, constipation, diarrhea, nausea, rectal pain and vomiting.  Endocrine: Negative for cold intolerance and heat intolerance.  Genitourinary: Negative for decreased urine  volume, difficulty urinating, dyspareunia, dysuria, flank pain, frequency, genital sores, hematuria, menstrual problem, pelvic pain, urgency, vaginal bleeding, vaginal discharge and vaginal pain.  Musculoskeletal: Positive for back pain (chronic pain). Negative for arthralgias, gait problem, joint swelling, myalgias, neck pain and neck stiffness.  Skin: Negative for color change, pallor and rash.  Allergic/Immunologic: Negative for environmental allergies and food allergies.  Neurological: Negative for dizziness, tremors, seizures, syncope, facial asymmetry, speech difficulty, weakness, light-headedness, numbness and headaches.  Hematological: Negative for adenopathy. Does not bruise/bleed easily.  Psychiatric/Behavioral: Negative for agitation, behavioral problems, confusion, hallucinations, sleep disturbance and suicidal ideas.    Physical Exam: Vitals:   09/02/17 1427  Weight: 139 lb (63 kg)  Height: 5\' 3"  (1.6 m)   Body mass index is 24.62 kg/m. Physical Exam  Constitutional: She is oriented to person, place, and time. She appears well-developed and well-nourished. No distress.  HENT:  Head: Normocephalic and atraumatic.  Right Ear: External ear normal.  Nose: Nose normal.  Mouth/Throat: Oropharynx is clear and moist. No oropharyngeal exudate.  Left ear cerumen impaction. (Wears hearing aid left ear)  Eyes: Pupils are equal, round, and reactive to light. Conjunctivae and EOM are normal. Right eye exhibits no discharge. Left eye exhibits no discharge. No scleral icterus.  Wears glasses   Neck: Normal range of motion. Neck supple. No JVD present. No thyromegaly present.  Cardiovascular: Normal rate, regular rhythm, normal heart sounds and intact distal pulses.   Pace maker left upper chest   Pulmonary/Chest: Effort normal and breath sounds normal. No respiratory distress. She has no wheezes. She has no rales. She exhibits no tenderness.  Abdominal: Soft. Bowel sounds are normal.  She exhibits no distension and no mass. There is tenderness (left lower quadrant and right lower quadrent). There is no rebound and no guarding.    Musculoskeletal: Normal range of motion. She exhibits no edema, tenderness or deformity.  Lymphadenopathy:    She has no cervical adenopathy.  Neurological: She is alert and oriented to person, place, and time. She has normal reflexes. No cranial nerve deficit. Coordination normal.  Skin: Skin is warm and dry. No rash noted. She is not diaphoretic. No erythema.  Psychiatric: She has a normal mood and affect. Her behavior is normal. Judgment and thought content normal.    Labs reviewed: Basic Metabolic Panel:  Recent Labs  12/22/16 0523 12/23/16 0527  06/06/17 07/16/17 1012 07/30/17 1521 07/30/17 1521  NA 140 142  < > 140 140 140  --   K 3.6 3.9  < > 4.8 4.8 4.4  --   CL 108 109  --   --  104  --   --  CO2 25 28  --   --  29 28  --   GLUCOSE 93 94  --   --  101* 88  --   BUN 12 7  < > 25* 21 20.4  --   CREATININE 0.79 0.78  < > 1.0 0.99* 0.9  --   CALCIUM 8.2* 8.1*  --   --  9.6 9.7  --   TSH  --   --   --  2.36 0.85  --  0.458  < > = values in this interval not displayed. Liver Function Tests:  Recent Labs  12/20/16 2112  06/03/17 06/06/17 07/16/17 1012 07/30/17 1521  AST 18  < > 15 18 21 22   ALT 19  < > 21 25 17 23   ALKPHOS 51  < > 56 71  --  76  BILITOT 0.5  --   --   --  0.7 0.74  PROT 6.1*  --   --   --  6.8 7.6  ALBUMIN 3.3*  --   --   --   --  3.9  < > = values in this interval not displayed. No results for input(s): LIPASE, AMYLASE in the last 8760 hours. No results for input(s): AMMONIA in the last 8760 hours. CBC:  Recent Labs  08/07/17 1237 08/14/17 1323 08/28/17 1251  WBC 4.1 3.5* 4.9  NEUTROABS 2.4 1.9 2.9  HGB 10.0* 8.9* 10.1*  HCT 30.9* 27.2* 30.3*  MCV 99.7 101.9* 99.2  PLT 195 244 249   Lipid Panel:  Recent Labs  02/11/17  CHOL 174  HDL 37  LDLCALC 106  TRIG 194*   No results found  for: HGBA1C  Procedures: No results found.  Assessment/Plan There are no diagnoses linked to this encounter.   Labs/tests ordered:    Next appt:

## 2017-09-02 NOTE — Progress Notes (Signed)
Patient ID: Kimberly Horn, female   DOB: 13-Mar-1930, 83 y.o.   MRN: [id number]   Location:  [facility]  Place of Service:  OFFICE  Provider: Arletha Grippe, DO  Patient Care Team: Kimberly Cranker, DO as PCP - General (Internal Medicine)  Extended Emergency Contact Information Primary Emergency Contact: Kimberly Horn Address: [address] [place] [address] [place] Phone: [phone] Mobile Phone: [phone] Relation: Son Secondary Emergency Contact: Kimberly Horn Address: [address] [place] [address] [place] Phone: [phone] Mobile Phone: [phone] Relation: Other  Code Status:  Goals of Care: Advanced Directive information Advanced Directives 08/06/2017  Does Patient Have a Medical Advance Directive? No  Type of Advance Directive -  Does patient want to make changes to medical advance directive? -  Copy of [place] in Chart? -  Would patient like information on creating a medical advance directive? Yes (MAU/Ambulatory/Procedural Areas - Information given)     Chief Complaint  Patient presents with  . Medical Management of Chronic Issues    Extended visit, AWV completed on 08/06/17 (25/30 MMSE, passed clock drawing), - depression, + Fall risk( will try silver sneakers), and EKG up to date  . Medication Refill    Refill potassium and prilosec   . Neck Pain    Neck pain off/on pain radiates into upper back and shoulders. Pain is present today   . Medication Management    Discuss Vit D3, patient questions if she needs for she is taking calcium with Vit D     HPI: Patient is a 83 y.o. female seen in today for an extended visit.  She completed her AWV (note reviewed) on 08/06/17. She has several concerns. She wonders if she needs to take 2 different Vit D doses. Hearing is poor and she only has 1 hearing aid. She has itching in left ear. She reports bouts of diarrhea and rectal  bleeding.she has been taking prilosec x several mos and has taken nexium in the past. She has intermittent dizziness and HA this week. No falls. She has rec'd 2 units PRBCs since Sept OV.  PAF - rate controlled s/p pacer. eliquis stopped 2/2 anemia.  CHF - stable on prn lasix with KCl. Last 2D echo unknown. Followed by cardio in [place], [place]  Idiopathic pulmonary fibrosis - O2 dependent @ 3L/min and increases to 4L/min with moderate exertion. Takes singulair. Has seen pulmonary in the past  Seasonal allergy - takes singulair. She has allergy to dust and sycamore trees.  Anemia/MDS with 5q deletion - requires frequent PRBCs 2/2 GI bleeds. 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 Followed by hematology Dr Kimberly Horn. Hgb 10.1.   Hypothyroidism - stable on levothyroxine. TSH 0.458; T4 free 1.2  PE/DVT hx - stable. No sx's of exacerbation/recurrence. she does not have an IVC filter. eliquis stopped 2/2 anemia  GERD - stable on omeprazole and carafate. Belching controlled  Chronic pain syndrome - due to back. She has a hx back sx. Takes norco qhs and daily cymbalta. She has worse pain in upper back between shoulder blades today.   Insomnia - problems falling asleep. stable on melatonin 10mg   Osteoporosis - takes calcium and Vit D. Insurance has approved prolia injection every 6 mos  She has a daughter, Kimberly Horn, who is an Regulatory affairs officer in [place], [place]. 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  Depression screen [name] 2/9 09/02/2017 08/06/2017 07/16/2017  Decreased Interest 0 0 0  Down, Depressed, Hopeless 0 0 0  PHQ - 2 Score 0 0 0    Fall Risk  09/02/2017 08/06/2017 07/16/2017  Falls in the past year? Yes Yes Yes  Number falls in past yr: 2 or more 2 or more 2 or more  Injury with Fall? Yes Yes Yes  Comment - - Fractured ribs    MMSE - Mini Mental State Exam 08/06/2017  Orientation to time 4  Orientation to Place 3    Registration 3  Attention/ Calculation 5  Recall 1  Language- name 2 objects 2  Language- repeat 1  Language- follow 3 step command 3  Language- read & follow direction 1  Write a sentence 1  Copy design 1  Total score 25     Health Maintenance  Topic Date Due  . TETANUS/TDAP  01/23/1949  . PNA vac Low Risk Adult (1 of 2 - PCV13) 01/22/1997  . INFLUENZA VACCINE  Completed  . DEXA SCAN  Completed    Past Medical History:  Diagnosis Date  . Anxiety   . Atrial fibrillation ([place])   . Benign neoplasm of brain ([place])   . CHF (congestive heart failure) ([place])   . Difficulty hearing   . Diverticulitis   . DVT (deep venous thrombosis) ([place])   . Esophageal reflux   . Flu 11/04/2014  . GI bleed   . Hyperlipidemia   . Hypothyroid   . Hypothyroidism   . Low blood pressure    when hbg is bekow 9  . Myelodysplastic syndrome with 5q deletion ([place])   . Osteoarthritis 10/09/2010  . Osteoporosis 10/09/2010  . Pacemaker   . Peptic ulcer    with GERD  . Peptic ulcer   . Pulmonary embolism ([place])   . Pulmonary fibrosis ([place])     Past Surgical History:  Procedure Laterality Date  . ABLATION    . APPENDECTOMY    . BACK SURGERY    . BILATERAL CARPAL TUNNEL RELEASE    . CATARACT EXTRACTION, BILATERAL    . CHOLECYSTECTOMY    . ESOPHAGOGASTRODUODENOSCOPY (EGD) WITH PROPOFOL N/A 12/22/2016   Procedure: ESOPHAGOGASTRODUODENOSCOPY (EGD) WITH PROPOFOL;  Surgeon: Mauri Pole, MD;  Location: [facility];  Service: Endoscopy;  Laterality: N/A;  . HERNIA REPAIR    . PACEMAKER INSERTION    . TONSILLECTOMY    . TUBAL LIGATION Bilateral     Family History  Problem Relation Age of Onset  . Heart attack Mother   . Cancer Father        throat  . Arthritis Sister   . Lung disease Brother   . Heart disease Brother   . Cancer Brother   . Gout Brother   . Arthritis Brother   . Stroke Son   . Diabetes Son    Family Status  Relation Status  . Mother Deceased  . Father Deceased   . Sister Alive  . Brother Deceased  . Brother Deceased  . Brother Alive  . Sister Deceased  . Sister Alive  . Brother Alive  . Daughter Alive  . Daughter Alive  . Daughter Alive  . Son Alive  . Son Alive    Social History   Social History  . Marital status: Widowed    Spouse name: N/A  . Number of children: 5  . Years of education:  N/A   Occupational History  . Not on file.   Social History Main Topics  . Smoking status: Never Smoker  . Smokeless tobacco: Never Used  . Alcohol use No  . Drug use: No  . Sexual activity: Not Currently   Other Topics Concern  . Not on file   Social History Narrative   Social History      Diet? I eat what I want      Do you drink/eat things with caffeine? yes      Marital status?            widowed                        What year were you married? 1950      Do you live in a house, apartment, assisted living, condo, trailer, etc.? With family      Is it one or more stories? 2 stories but stay on 1st floor       How many persons live in your home?       Do you have any pets in your home? (please list) usually 0 (but currently 1 cat)      Highest level of education completed?       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         some                             Type & how often? When I think about it      Advanced Directives      Do you have a living will? no      Do you have a DNR form?     no                             If not, do you want to discuss one? no      Do you have signed POA/HPOA for forms?  no      Functional Status      Do you have difficulty bathing or dressing yourself?      Do you have difficulty preparing food or eating?       Do you have difficulty managing your medications?      Do you have difficulty managing your finances?      Do you have difficulty affording your medications?       Allergies  Allergen Reactions  . Demerol [Meperidine]  Other (See Comments)    INTENSIFIED FEELINGS  . Hydroxyzine Other (See Comments)    INTENSIFIED FEELINGS  . Talwin [Pentazocine] Other (See Comments)    INTENSIFIED FEELINGS    Allergies as of 09/02/2017      Reactions   Demerol [meperidine] Other (See Comments)   INTENSIFIED FEELINGS   Hydroxyzine Other (See Comments)   INTENSIFIED FEELINGS   Talwin [pentazocine] Other (See Comments)   INTENSIFIED FEELINGS      Medication List       Accurate as of 09/02/17  3:18 PM. Always use your most recent med list.          CENTRUM SILVER ULTRA MENS PO Take by mouth daily.   DULoxetine 30 MG capsule Commonly known as:  CYMBALTA Take 1 capsule (30 mg total) by mouth 2 (two) times daily.  furosemide 20 MG tablet Commonly known as:  LASIX Take 20 mg by mouth as needed.   HYDROcodone-acetaminophen 5-325 MG tablet Commonly known as:  NORCO/VICODIN Take 1 tablet by mouth at bedtime. Only takes as needed   levothyroxine 100 MCG tablet Commonly known as:  SYNTHROID, LEVOTHROID Take 100 mcg by mouth daily. In the morning   Melatonin 10 MG Caps Take 1 tablet by mouth daily.   montelukast 10 MG tablet Commonly known as:  SINGULAIR Take 10 mg by mouth daily. In the morning   omeprazole 40 MG capsule Commonly known as:  PRILOSEC Take 1 capsule (40 mg total) by mouth 2 (two) times daily.   OXYGEN Inhale 3-4 L into the lungs daily.   potassium chloride SA 20 MEQ tablet Commonly known as:  K-DUR,KLOR-CON Take 20 mEq by mouth as needed. When taking furosemide   sucralfate 1 GM/10ML suspension Commonly known as:  CARAFATE Take 10 mLs (1 g total) by mouth 2 (two) times daily.   VIACTIV PO Take by mouth. Plus D and K 2 by mouth at lunch   Vitamin D3 5000 units Caps Take 1 capsule by mouth daily.        Review of Systems:  Review of Systems  Musculoskeletal: Positive for arthralgias and back pain.  Skin: Positive for rash.  Neurological: Positive for dizziness and  headaches.  All other systems reviewed and are negative.   Physical Exam: Vitals:   09/02/17 1427  BP: 122/70  Pulse: 83  Temp: (!) 97.5 F (36.4 C)  TempSrc: Oral  SpO2: 99%  Weight: 139 lb (63 kg)  Height: 5\' 3"  (1.6 m)   Body mass index is 24.62 kg/m. Physical Exam  Constitutional: She is oriented to person, place, and time. She appears well-developed and well-nourished. No distress.  HENT:  Head: Normocephalic and atraumatic.  Right Ear: External ear normal.  Left Ear: External ear normal.  Mouth/Throat: Oropharynx is clear and moist. No oropharyngeal exudate.  MMM; no oral thrush  Eyes: Pupils are equal, round, and reactive to light. EOM are normal. No scleral icterus.  Neck: Normal range of motion. Neck supple. Carotid bruit is not present. No tracheal deviation present. No thyromegaly present.  Cardiovascular: Normal rate, regular rhythm and intact distal pulses.  Exam reveals no gallop and no friction rub.   Murmur (1/6 SEM) heard. No LE edema b/l. No calf TTP  Pulmonary/Chest: Effort normal and breath sounds normal. No respiratory distress. She has no wheezes. She has no rales. She exhibits no tenderness. Right breast exhibits no mass, no nipple discharge, no skin change and no tenderness. Left breast exhibits tenderness (left with palpable compressible cysts). Left breast exhibits no mass, no nipple discharge and no skin change. Breasts are symmetrical.  No rhonchi  Abdominal: Soft. Bowel sounds are normal. She exhibits no distension and no mass. There is no hepatosplenomegaly or hepatomegaly. There is no tenderness. There is no rebound and no guarding. No hernia.  Musculoskeletal: She exhibits edema and tenderness. She exhibits no deformity.       Thoracic back: She exhibits decreased range of motion, tenderness, swelling and spasm.       Back:  Lymphadenopathy:    She has no cervical adenopathy.  Neurological: She is alert and oriented to person, place, and time.  She has normal reflexes.  Skin: Skin is warm and dry. Rash (left external ear canal with eczematous rash but no d/c or secondary signs of infection) noted.  Psychiatric: She has a  normal mood and affect. Her behavior is normal. Judgment and thought content normal.  Vitals reviewed.   Labs reviewed:  Basic Metabolic Panel:  Recent Labs  12/22/16 0523 12/23/16 0527  06/06/17 07/16/17 1012 07/30/17 1521 07/30/17 1521  NA 140 142  < > 140 140 140  --   K 3.6 3.9  < > 4.8 4.8 4.4  --   CL 108 109  --   --  104  --   --   CO2 25 28  --   --  29 28  --   GLUCOSE 93 94  --   --  101* 88  --   BUN 12 7  < > 25* 21 20.4  --   CREATININE 0.79 0.78  < > 1.0 0.99* 0.9  --   CALCIUM 8.2* 8.1*  --   --  9.6 9.7  --   TSH  --   --   --  2.36 0.85  --  0.458  < > = values in this interval not displayed. Liver Function Tests:  Recent Labs  12/20/16 2112  06/03/17 06/06/17 07/16/17 1012 07/30/17 1521  AST 18  < > 15 18 21 22   ALT 19  < > 21 25 17 23   ALKPHOS 51  < > 56 71  --  76  BILITOT 0.5  --   --   --  0.7 0.74  PROT 6.1*  --   --   --  6.8 7.6  ALBUMIN 3.3*  --   --   --   --  3.9  < > = values in this interval not displayed. No results for input(s): LIPASE, AMYLASE in the last 8760 hours. No results for input(s): AMMONIA in the last 8760 hours. CBC:  Recent Labs  08/07/17 1237 08/14/17 1323 08/28/17 1251  WBC 4.1 3.5* 4.9  NEUTROABS 2.4 1.9 2.9  HGB 10.0* 8.9* 10.1*  HCT 30.9* 27.2* 30.3*  MCV 99.7 101.9* 99.2  PLT 195 244 249   Lipid Panel:  Recent Labs  02/11/17  CHOL 174  HDL 37  LDLCALC 106  TRIG 194*   No results found for: HGBA1C  Procedures: No results found.  Assessment/Plan   ICD-10-CM   1. Left lower quadrant pain R10.32   2. Diarrhea, unspecified type R19.7 C. difficile GDH and Toxin A/B    CT Abdomen Pelvis W Contrast  3. Age-related osteoporosis without current pathological fracture M81.0 Vitamin D, 25-hydroxy  4. Chronic pain syndrome  G89.4   5. MDS (myelodysplastic syndrome) with 5q deletion ([place]) D46.C   6. Anemia due to bone marrow failure, unspecified bone marrow failure type ([place]) D61.9   7. Generalized abdominal pain R10.84 CT Abdomen Pelvis W Contrast  8. Bilateral hearing loss, unspecified hearing loss type H91.93 Ambulatory referral to Audiology  9. Dermatitis L30.9   10. High risk medication use Z79.899 C. difficile GDH and Toxin A/B    Creatinine, Serum    BUN   Will call with lab results and referral to audiology  Will call with CT abdomen/pelvis appt to eval for colitis  Will consider changing prilosec to protonix once CT done and stool sample results done  Will call with Prolia information  Use OTC hydrocortisone cream to left ear 2 times daily x 2 weeks for dermatitis  Continue other medications as ordered  Follow up with specialists as scheduled  Follow up in 2 mos for chronic pain, diarrhea, anemia, GERD and osteoporosis  Keeping You  Healthy handout given    Cordella Register. Perlie Gold  [facility] [address] [place], [place] [address] [phone] Cell (Monday-Friday 8 AM - 5 PM) [phone] After 5 PM and follow prompts

## 2017-09-03 ENCOUNTER — Ambulatory Visit: Payer: Medicare Other

## 2017-09-03 ENCOUNTER — Other Ambulatory Visit (HOSPITAL_BASED_OUTPATIENT_CLINIC_OR_DEPARTMENT_OTHER): Payer: Medicare Other

## 2017-09-03 VITALS — BP 89/49 | HR 78

## 2017-09-03 DIAGNOSIS — D46C Myelodysplastic syndrome with isolated del(5q) chromosomal abnormality: Secondary | ICD-10-CM

## 2017-09-03 DIAGNOSIS — Z23 Encounter for immunization: Secondary | ICD-10-CM

## 2017-09-03 DIAGNOSIS — M810 Age-related osteoporosis without current pathological fracture: Secondary | ICD-10-CM | POA: Diagnosis not present

## 2017-09-03 DIAGNOSIS — R197 Diarrhea, unspecified: Secondary | ICD-10-CM | POA: Diagnosis not present

## 2017-09-03 DIAGNOSIS — Z79899 Other long term (current) drug therapy: Secondary | ICD-10-CM | POA: Diagnosis not present

## 2017-09-03 LAB — COMPREHENSIVE METABOLIC PANEL
ALT: 30 U/L (ref 0–55)
AST: 22 U/L (ref 5–34)
Albumin: 3.8 g/dL (ref 3.5–5.0)
Alkaline Phosphatase: 71 U/L (ref 40–150)
Anion Gap: 11 mEq/L (ref 3–11)
BILIRUBIN TOTAL: 0.7 mg/dL (ref 0.20–1.20)
BUN: 27.4 mg/dL — ABNORMAL HIGH (ref 7.0–26.0)
CO2: 25 meq/L (ref 22–29)
Calcium: 9.3 mg/dL (ref 8.4–10.4)
Chloride: 105 mEq/L (ref 98–109)
Creatinine: 1.1 mg/dL (ref 0.6–1.1)
EGFR: 47 mL/min/{1.73_m2} — AB (ref >60)
GLUCOSE: 114 mg/dL (ref 70–140)
Potassium: 3.9 mEq/L (ref 3.5–5.1)
SODIUM: 141 meq/L (ref 136–145)
TOTAL PROTEIN: 7.3 g/dL (ref 6.4–8.3)

## 2017-09-03 LAB — URIC ACID: Uric Acid, Serum: 7 mg/dl (ref 2.6–7.4)

## 2017-09-03 LAB — VITAMIN D 25 HYDROXY (VIT D DEFICIENCY, FRACTURES): VIT D 25 HYDROXY: 56 ng/mL (ref 30–100)

## 2017-09-03 LAB — CBC WITH DIFFERENTIAL/PLATELET
BASO%: 2 % (ref 0.0–2.0)
Basophils Absolute: 0.1 10*3/uL (ref 0.0–0.1)
EOS%: 0 % (ref 0.0–7.0)
Eosinophils Absolute: 0 10*3/uL (ref 0.0–0.5)
HCT: 30 % — ABNORMAL LOW (ref 34.8–46.6)
HGB: 9.8 g/dL — ABNORMAL LOW (ref 11.6–15.9)
LYMPH%: 23 % (ref 14.0–49.7)
MCH: 33.6 pg (ref 25.1–34.0)
MCHC: 32.7 g/dL (ref 31.5–36.0)
MCV: 102.7 fL — ABNORMAL HIGH (ref 79.5–101.0)
MONO#: 0.3 10*3/uL (ref 0.1–0.9)
MONO%: 8.5 % (ref 0.0–14.0)
NEUT%: 66.5 % (ref 38.4–76.8)
NEUTROS ABS: 2.3 10*3/uL (ref 1.5–6.5)
NRBC: 0 % (ref 0–0)
Platelets: 266 10*3/uL (ref 145–400)
RBC: 2.92 10*6/uL — AB (ref 3.70–5.45)
RDW: 24.4 % — AB (ref 11.2–14.5)
WBC: 3.5 10*3/uL — AB (ref 3.9–10.3)
lymph#: 0.8 10*3/uL — ABNORMAL LOW (ref 0.9–3.3)

## 2017-09-03 LAB — FERRITIN: Ferritin: 1629 ng/ml — ABNORMAL HIGH (ref 9–269)

## 2017-09-03 LAB — IRON AND TIBC
%SAT: 99 % — ABNORMAL HIGH (ref 21–57)
Iron: 256 ug/dL — ABNORMAL HIGH (ref 41–142)
TIBC: 259 ug/dL (ref 236–444)
UIBC: 3 ug/dL — ABNORMAL LOW (ref 120–384)

## 2017-09-03 LAB — LACTATE DEHYDROGENASE: LDH: 267 U/L — ABNORMAL HIGH (ref 125–245)

## 2017-09-03 LAB — CREATININE, SERUM: Creat: 1.08 mg/dL — ABNORMAL HIGH (ref 0.60–0.88)

## 2017-09-03 LAB — BUN: BUN: 27 mg/dL — AB (ref 7–25)

## 2017-09-03 MED ORDER — INFLUENZA VAC SPLIT QUAD 0.5 ML IM SUSY: 0.5000 mL | PREFILLED_SYRINGE | Freq: Once | INTRAMUSCULAR | Status: DC

## 2017-09-03 NOTE — Progress Notes (Signed)
Pt Hgb today is 9.8. No PRBC's today per Dr. Lebron Conners. Blood pressure low, 89/49. MD aware. Patient took a pain pill earlier today. Breathing WNL, o2 100% on 3.5L. No other complaints.  Cyndia Bent RN

## 2017-09-04 ENCOUNTER — Ambulatory Visit (HOSPITAL_BASED_OUTPATIENT_CLINIC_OR_DEPARTMENT_OTHER): Payer: Medicare Other | Admitting: Hematology and Oncology

## 2017-09-04 ENCOUNTER — Ambulatory Visit (HOSPITAL_COMMUNITY)
Admission: RE | Admit: 2017-09-04 | Discharge: 2017-09-04 | Disposition: A | Discharge status: Home / Self Care | Payer: Medicare Other | Source: Ambulatory Visit | Attending: Hematology and Oncology | Admitting: Hematology and Oncology

## 2017-09-04 ENCOUNTER — Telehealth: Payer: Self-pay | Admitting: Hematology and Oncology

## 2017-09-04 VITALS — BP 91/40 | HR 83 | Temp 97.6°F | Resp 18 | Ht 63.0 in | Wt 139.4 lb

## 2017-09-04 DIAGNOSIS — K279 Peptic ulcer, site unspecified, unspecified as acute or chronic, without hemorrhage or perforation: Secondary | ICD-10-CM | POA: Diagnosis not present

## 2017-09-04 DIAGNOSIS — K921 Melena: Secondary | ICD-10-CM

## 2017-09-04 DIAGNOSIS — D46C Myelodysplastic syndrome with isolated del(5q) chromosomal abnormality: Secondary | ICD-10-CM | POA: Diagnosis not present

## 2017-09-04 DIAGNOSIS — I4891 Unspecified atrial fibrillation: Secondary | ICD-10-CM | POA: Diagnosis not present

## 2017-09-04 DIAGNOSIS — Z86718 Personal history of other venous thrombosis and embolism: Secondary | ICD-10-CM

## 2017-09-04 DIAGNOSIS — D619 Aplastic anemia, unspecified: Secondary | ICD-10-CM

## 2017-09-04 DIAGNOSIS — I509 Heart failure, unspecified: Secondary | ICD-10-CM

## 2017-09-04 DIAGNOSIS — D649 Anemia, unspecified: Secondary | ICD-10-CM | POA: Insufficient documentation

## 2017-09-04 DIAGNOSIS — J84112 Idiopathic pulmonary fibrosis: Secondary | ICD-10-CM

## 2017-09-04 NOTE — Telephone Encounter (Signed)
Gave avs and calendar for November - January 2019 however waiting for referral and echo from MD

## 2017-09-08 ENCOUNTER — Telehealth: Payer: Self-pay

## 2017-09-08 ENCOUNTER — Other Ambulatory Visit: Payer: Medicare Other

## 2017-09-08 NOTE — Telephone Encounter (Signed)
Patient was given a Cdiff container and returned it today, no orders pending please advise

## 2017-09-08 NOTE — Telephone Encounter (Signed)
The order was placed on the day of her last visit 09/02/17

## 2017-09-08 NOTE — Telephone Encounter (Signed)
Pt daughter-in-law, Lamona Curl, called because the appointment scheduled for labs and infusion on 09/17/17 will not work for them as another family member will be coming to care for the patient who is not as aware of pt needs at this time. Scheduling message sent to have pt rescheduled for 11/12 or 11/13 if possible. Scheduler to call desk RN back if unable to make change.

## 2017-09-08 NOTE — Telephone Encounter (Signed)
Per Linus Orn order was split and showed in her system on a delay. No action required at this time

## 2017-09-09 ENCOUNTER — Telehealth: Payer: Self-pay

## 2017-09-09 LAB — C. DIFFICILE GDH AND TOXIN A/B
GDH ANTIGEN: NOT DETECTED
MICRO NUMBER: 81241397
SPECIMEN QUALITY: ADEQUATE
TOXIN A AND B: NOT DETECTED

## 2017-09-09 NOTE — Telephone Encounter (Signed)
Pt daughter-in-law, Lamona Curl, called yesterday requesting appt to be changed for next week. Infusion room over capacity on 11/12 and 11/13. Left VM with pt daughter-in-law explaining the outcome of request. Desk RN number given to family to call back with concerns [phone].

## 2017-09-11 ENCOUNTER — Telehealth: Payer: Self-pay | Admitting: Hematology and Oncology

## 2017-09-11 NOTE — Telephone Encounter (Signed)
Canceled patients appointments 11/14 per son. Patient scheduled 11/13 at [facility]

## 2017-09-12 ENCOUNTER — Ambulatory Visit
Admission: RE | Admit: 2017-09-12 | Discharge: 2017-09-12 | Disposition: A | Discharge status: Home / Self Care | Payer: Medicare Other | Source: Ambulatory Visit | Attending: Internal Medicine | Admitting: Internal Medicine

## 2017-09-12 DIAGNOSIS — R1084 Generalized abdominal pain: Secondary | ICD-10-CM

## 2017-09-12 DIAGNOSIS — R197 Diarrhea, unspecified: Secondary | ICD-10-CM

## 2017-09-12 DIAGNOSIS — R109 Unspecified abdominal pain: Secondary | ICD-10-CM | POA: Diagnosis not present

## 2017-09-12 MED ORDER — IOPAMIDOL (ISOVUE-300) INJECTION 61%
100.0000 mL | Freq: Once | INTRAVENOUS | Status: AC | PRN
  Administered 2017-09-12: 100 mL via INTRAVENOUS

## 2017-09-15 ENCOUNTER — Other Ambulatory Visit: Payer: Self-pay

## 2017-09-15 MED ORDER — OMEPRAZOLE 40 MG PO CPDR: 40.0000 mg | DELAYED_RELEASE_CAPSULE | Freq: Two times a day (BID) | ORAL | 2 refills | Status: DC

## 2017-09-15 NOTE — Telephone Encounter (Signed)
A refill was requested by patient's daughter in law, [place]. Rx was sent to pharmacy electronically

## 2017-09-16 ENCOUNTER — Other Ambulatory Visit: Payer: Self-pay

## 2017-09-16 ENCOUNTER — Encounter: Payer: Self-pay | Admitting: Hematology and Oncology

## 2017-09-16 ENCOUNTER — Ambulatory Visit (HOSPITAL_COMMUNITY)
Admission: RE | Admit: 2017-09-16 | Discharge: 2017-09-16 | Disposition: A | Discharge status: Home / Self Care | Payer: Medicare Other | Source: Ambulatory Visit | Attending: Hematology and Oncology | Admitting: Hematology and Oncology

## 2017-09-16 ENCOUNTER — Other Ambulatory Visit (HOSPITAL_BASED_OUTPATIENT_CLINIC_OR_DEPARTMENT_OTHER): Payer: Medicare Other

## 2017-09-16 ENCOUNTER — Telehealth: Payer: Self-pay

## 2017-09-16 DIAGNOSIS — D649 Anemia, unspecified: Secondary | ICD-10-CM | POA: Diagnosis not present

## 2017-09-16 DIAGNOSIS — D619 Aplastic anemia, unspecified: Secondary | ICD-10-CM

## 2017-09-16 DIAGNOSIS — D46C Myelodysplastic syndrome with isolated del(5q) chromosomal abnormality: Secondary | ICD-10-CM | POA: Diagnosis present

## 2017-09-16 DIAGNOSIS — K921 Melena: Secondary | ICD-10-CM

## 2017-09-16 LAB — COMPREHENSIVE METABOLIC PANEL
ALBUMIN: 3.6 g/dL (ref 3.5–5.0)
ALT: 19 U/L (ref 0–55)
AST: 16 U/L (ref 5–34)
Alkaline Phosphatase: 65 U/L (ref 40–150)
Anion Gap: 7 mEq/L (ref 3–11)
BUN: 22.4 mg/dL (ref 7.0–26.0)
CHLORIDE: 104 meq/L (ref 98–109)
CO2: 27 mEq/L (ref 22–29)
CREATININE: 1 mg/dL (ref 0.6–1.1)
Calcium: 8.9 mg/dL (ref 8.4–10.4)
EGFR: 54 mL/min/{1.73_m2} — ABNORMAL LOW (ref >60)
GLUCOSE: 75 mg/dL (ref 70–140)
POTASSIUM: 4.4 meq/L (ref 3.5–5.1)
SODIUM: 138 meq/L (ref 136–145)
Total Bilirubin: 0.62 mg/dL (ref 0.20–1.20)
Total Protein: 7 g/dL (ref 6.4–8.3)

## 2017-09-16 LAB — IRON AND TIBC
%SAT: >100 % (ref 21–?)
Iron: 259 ug/dL — ABNORMAL HIGH (ref 41–142)
TIBC: 254 ug/dL (ref 236–444)

## 2017-09-16 LAB — FERRITIN: Ferritin: 1088 ng/ml — ABNORMAL HIGH (ref 9–269)

## 2017-09-16 LAB — CBC & DIFF AND RETIC
BASO%: 1 % (ref 0.0–2.0)
Basophils Absolute: 0 10*3/uL (ref 0.0–0.1)
EOS%: 0 % (ref 0.0–7.0)
Eosinophils Absolute: 0 10*3/uL (ref 0.0–0.5)
HCT: 26.8 % — ABNORMAL LOW (ref 34.8–46.6)
HGB: 8.6 g/dL — ABNORMAL LOW (ref 11.6–15.9)
Immature Retic Fract: 14.1 % — ABNORMAL HIGH (ref 1.60–10.00)
LYMPH#: 1.1 10*3/uL (ref 0.9–3.3)
LYMPH%: 27.6 % (ref 14.0–49.7)
MCH: 34.5 pg — AB (ref 25.1–34.0)
MCHC: 32.1 g/dL (ref 31.5–36.0)
MCV: 107.6 fL — ABNORMAL HIGH (ref 79.5–101.0)
MONO#: 0.5 10*3/uL (ref 0.1–0.9)
MONO%: 11.6 % (ref 0.0–14.0)
NEUT%: 59.8 % (ref 38.4–76.8)
NEUTROS ABS: 2.4 10*3/uL (ref 1.5–6.5)
NRBC: 0 % (ref 0–0)
Platelets: 289 10*3/uL (ref 145–400)
RBC: 2.49 10*6/uL — ABNORMAL LOW (ref 3.70–5.45)
RETIC %: 2.6 % — AB (ref 0.70–2.10)
Retic Ct Abs: 64.74 10*3/uL (ref 33.70–90.70)
WBC: 4 10*3/uL (ref 3.9–10.3)

## 2017-09-16 LAB — PREPARE RBC (CROSSMATCH)

## 2017-09-16 MED ORDER — ACETAMINOPHEN 325 MG PO TABS
650.0000 mg | ORAL_TABLET | Freq: Once | ORAL | Status: AC
  Administered 2017-09-16: 650 mg via ORAL
  Filled 2017-09-16: qty 2

## 2017-09-16 MED ORDER — SODIUM CHLORIDE 0.9 % IV SOLN
250.0000 mL | Freq: Once | INTRAVENOUS | Status: AC
  Administered 2017-09-16: 250 mL via INTRAVENOUS

## 2017-09-16 MED ORDER — FUROSEMIDE 10 MG/ML IJ SOLN: 20.0000 mg | Freq: Once | INTRAMUSCULAR | Status: DC

## 2017-09-16 MED ORDER — SODIUM CHLORIDE 0.9 % IV SOLN: 250.0000 mL | Freq: Once | INTRAVENOUS | Status: DC

## 2017-09-16 MED ORDER — SODIUM CHLORIDE 0.9% FLUSH: 3.0000 mL | INTRAVENOUS | Status: DC | PRN

## 2017-09-16 MED ORDER — SODIUM CHLORIDE 0.9% FLUSH: 10.0000 mL | INTRAVENOUS | Status: DC | PRN

## 2017-09-16 MED ORDER — HEPARIN SOD (PORK) LOCK FLUSH 100 UNIT/ML IV SOLN: 250.0000 [IU] | INTRAVENOUS | Status: DC | PRN

## 2017-09-16 MED ORDER — HEPARIN SOD (PORK) LOCK FLUSH 100 UNIT/ML IV SOLN: 500.0000 [IU] | Freq: Every day | INTRAVENOUS | Status: DC | PRN

## 2017-09-16 NOTE — Progress Notes (Signed)
Patient's daughter reported that patient's blood pressure had been running low. Daughter concerned that patient would receive Lasix after blood and further decrease her BP. Dr. Clydene Laming office notified and verbal permission given to withhold Lasix if ordered.

## 2017-09-16 NOTE — Progress Notes (Signed)
[facility] Follow-up Visit:  Assessment: MDS (myelodysplastic syndrome) with 5q deletion (Kimberly Horn) 83 y.o. female with del 5q MDS diagnosis, currently managed supportive care risk over initiation of lenalidomide therapy based on the previous history of DVT, peptic ulcer disease, and GI bleeding in addition to multiple other comorbidities including pulmonary fibrosis, congestive heart failure, and atrial fibrillation. Patient underwent appropriately usual evaluation locally confirming presence of myelodysplastic syndrome. Our additional evaluation demonstrated reasonably high erythropoietin level making supplementation unlikely to be helpful in relieving anemia.  At the present time, patient is requiring infrequent transfusions. She required two units over the past month. Labral demonstrates progressive elevation of ferritin consistent with iron overload due to recurrent transfusions as well as inefficient erythropoiesis. I do recommend iron chelation with Exjade to reduce potential negative impact of iron overload.   Plan: --Audiology/ophthalmology evaluation --ECHO --Decrease lab frequency to [facility] based on decision requirements over the past month.  --Continue transfusion support: 1U pRBCs for Hgb <=9.0g/dL, all products leukoreduced and irradiated; --Labs prior to RTC including ferritin values  Return to clinic in three months for continued hematological monitoring  Voice recognition software was used and creation of this note. Despite my best effort at editing the text, some misspelling/errors may have occurred.  Orders Placed This Encounter  Procedures  . CBC & Diff and Retic    Standing Status:   Standing    Number of Occurrences:   12    Standing Expiration Date:   09/04/2018  . CBC with Differential    Standing Status:   Future    Standing Expiration Date:   09/04/2018  . Comprehensive metabolic panel    Standing Status:   Standing    Number of Occurrences:   6     Standing Expiration Date:   09/04/2018  . Ferritin    Standing Status:   Standing    Number of Occurrences:   6    Standing Expiration Date:   09/04/2018  . Iron and TIBC    Standing Status:   Standing    Number of Occurrences:   6    Standing Expiration Date:   09/04/2018  . Ambulatory referral to Audiology    Referral Priority:   Routine    Referral Type:   Audiology Exam    Referral Reason:   Specialty Services Required    Number of Visits Requested:   1  . Practitioner attestation of consent    I, the ordering practitioner, attest that I have discussed with the patient the benefits, risks, side effects, alternatives, likelihood of achieving goals and potential problems during recovery for the procedure listed.    Standing Status:   Future    Standing Expiration Date:   09/04/2018    Order Specific Question:   Procedure    Answer:   Blood Product(s)  . Complete patient signature process for consent form    Standing Status:   Future    Standing Expiration Date:   09/04/2018  . Care order/instruction    Transfuse Parameters: Hgb <= 9.0g/dL -- transfuse 1 unit pRBC and re-check Hgb, give additional units as needed    Standing Status:   Future    Standing Expiration Date:   09/04/2018  . ECHOCARDIOGRAM COMPLETE    Standing Status:   Future    Standing Expiration Date:   12/05/2018    Order Specific Question:   Where should this test be performed    Answer:   [name]  Order Specific Question:   Perflutren DEFINITY (image enhancing agent) should be administered unless hypersensitivity or allergy exist    Answer:   Administer Perflutren    Order Specific Question:   Expected Date:    Answer:   1 week  . Hold Tube, Blood Bank    Standing Status:   Standing    Number of Occurrences:   12    Standing Expiration Date:   09/04/2018  . Type and screen    Standing Status:   Standing    Number of Occurrences:   12    Standing Expiration Date:   09/04/2018    All questions were  answered.  . The patient knows to call the clinic with any problems, questions or concerns.  This note was electronically signed.    History of Presenting Illness Kimberly Horn is an 83 y.o. female followed in the [facility] for Myelodysplastic syndrome with deletion 5q. The patient was previously managed by Dr. Radene Knee in [place]. Lenalidomide therapy was considered, but wasn't started due to history of peptic ulcer disease with gastrointestinal bleeding causing significant risk of anticoagulation and previous history of DVT. So far, patient was managed symptomatically with transfusions. Patient usually develops symptoms of lightheadedness, dizziness, orthostatic lightheadedness at hemoglobin 9.0g/dL, and symptoms resolved while hemoglobin is above that target.  Patient returns to the clinic for continued hematological monitoring. Patient denies any complaints since the last visit to the clinic. At the present time, patient denies active chest pain, her shortness of breath is at baseline for pulmonary fibrosis diagnosis, as well as congestive heart failure. Patient denies productive cough, fevers, chills, night sweats. Abdominal pain, nausea, vomiting, diarrhea, or constipation. No dysuria or hematuria. No current skin rash.  Patient has not undergone ophthalmology or audiology examination that was discussed previously for possible initiation of iron collation therapy.  Oncological/hematological History: --Labs, 12/21/16:                                                             Vit B12 306, Folate 28.5, Fe 209, FeSat 79%, TIBC 263, Ferritin 220; --Labs, 06/06/17:                  Hgb 6.1,                              Vit B12 588  --BM Bx, 06/07/17: Hypercellular bone marrow with 40% cellularity with megakaryocytic dysplasia and increased to renewing sideroblasts consistent with myelodysplastic syndrome. 3% atypical blasts present; CytoGen -- 46,XX,del(5)(q15q33)/46,XX; no other cytogenetic  abnormalities identified; FISH -- positive for del5q & negative for any other significant abnormalities --Labs, 07/23/17: WBC 4.0, Hgb 9.5, Plt 323; --Labs, 07/30/17:                                              Fe 221, FeSat 82%, TIBC 269, Ferritin 1222; Epo 457; 1U pRBC transfused --Labs, 09/03/17: WBC 3.5, Hgb 9.8, Plt 266; Fe 256, FeSat 99%, TIBC 259, Ferritin 1629;  --Clinic visit, 09/04/17: in the interim required 2U pRBC on 08/14/17 (Positive for anti-K Abs)    Medical History:  Past Medical History:  Diagnosis Date  . Anxiety   . Atrial fibrillation ([place])   . Benign neoplasm of brain ([place])   . CHF (congestive heart failure) ([place])   . Difficulty hearing   . Diverticulitis   . DVT (deep venous thrombosis) ([place])   . Esophageal reflux   . Flu 11/04/2014  . GI bleed   . Hyperlipidemia   . Hypothyroid   . Hypothyroidism   . Low blood pressure    when hbg is bekow 9  . Myelodysplastic syndrome with 5q deletion ([place])   . Osteoarthritis 10/09/2010  . Osteoporosis 10/09/2010  . Pacemaker   . Peptic ulcer    with GERD  . Peptic ulcer   . Pulmonary embolism ([place])   . Pulmonary fibrosis [facility])     Surgical History: Past Surgical History:  Procedure Laterality Date  . ABLATION    . APPENDECTOMY    . BACK SURGERY    . BILATERAL CARPAL TUNNEL RELEASE    . CATARACT EXTRACTION, BILATERAL    . CHOLECYSTECTOMY    . HERNIA REPAIR    . PACEMAKER INSERTION    . TONSILLECTOMY    . TUBAL LIGATION Bilateral     Family History: Family History  Problem Relation Age of Onset  . Heart attack Mother   . Cancer Father        throat  . Arthritis Sister   . Lung disease Brother   . Heart disease Brother   . Cancer Brother   . Gout Brother   . Arthritis Brother   . Stroke Son   . Diabetes Son     Social History: Social History   Socioeconomic History  . Marital status: Widowed    Spouse name: Not on file  . Number of children: 5  . Years of education: Not on file  .  Highest education level: Not on file  Social Needs  . Financial resource strain: Not on file  . Food insecurity - worry: Not on file  . Food insecurity - inability: Not on file  . Transportation needs - medical: Not on file  . Transportation needs - non-medical: Not on file  Occupational History  . Not on file  Tobacco Use  . Smoking status: Never Smoker  . Smokeless tobacco: Never Used  Substance and Sexual Activity  . Alcohol use: No  . Drug use: No  . Sexual activity: Not Currently  Other Topics Concern  . Not on file  Social History Narrative   Social History      Diet? I eat what I want      Do you drink/eat things with caffeine? yes      Marital status?            widowed                        What year were you married? 1950      Do you live in a house, apartment, assisted living, condo, trailer, etc.? With family      Is it one or more stories? 2 stories but stay on 1st floor       How many persons live in your home?       Do you have any pets in your home? (please list) usually 0 (but currently 1 cat)      Highest level of education completed?       Current or past profession: Optometrist for [employer])      Do you exercise?         some                             Type & how often? When I think about it      Advanced Directives      Do you have a living will? no      Do you have a DNR form?     no                             If not, do you want to discuss one? no      Do you have signed POA/HPOA for forms?  no      Functional Status      Do you have difficulty bathing or dressing yourself?      Do you have difficulty preparing food or eating?       Do you have difficulty managing your medications?      Do you have difficulty managing your finances?      Do you have difficulty affording your medications?    Allergies: Allergies  Allergen Reactions  . Demerol [Meperidine] Other (See Comments)    INTENSIFIED FEELINGS  . Hydroxyzine  Other (See Comments)    INTENSIFIED FEELINGS  . Talwin [Pentazocine] Other (See Comments)    INTENSIFIED FEELINGS    Medications:  Current Outpatient Medications  Medication Sig Dispense Refill  . Calcium-Vitamin D-Vitamin K (VIACTIV PO) Take by mouth. Plus D and K 2 by mouth at lunch    . Cholecalciferol (VITAMIN D3) 5000 units CAPS Take 1 capsule by mouth daily.    . DULoxetine (CYMBALTA) 30 MG capsule Take 1 capsule (30 mg total) by mouth 2 (two) times daily. 60 capsule 5  . furosemide (LASIX) 20 MG tablet Take 20 mg by mouth as needed.     Marland Kitchen HYDROcodone-acetaminophen (NORCO/VICODIN) 5-325 MG tablet Take 1 tablet by mouth at bedtime. Only takes as needed     . levothyroxine (SYNTHROID, LEVOTHROID) 100 MCG tablet Take 100 mcg by mouth daily. In the morning    . Melatonin 10 MG CAPS Take 1 tablet by mouth daily.    . montelukast (SINGULAIR) 10 MG tablet Take 10 mg by mouth daily. In the morning    . Multiple Vitamins-Minerals (CENTRUM SILVER ULTRA MENS PO) Take by mouth daily.    Marland Kitchen omeprazole (PRILOSEC) 40 MG capsule Take 1 capsule (40 mg total) 2 (two) times daily by mouth. 60 capsule 2  . OXYGEN Inhale 3-4 L into the lungs daily.    . potassium chloride SA (K-DUR,KLOR-CON) 20 MEQ tablet Take 1 tablet (20 mEq total) by mouth as needed. When taking furosemide 30 tablet 2  . sucralfate (CARAFATE) 1 GM/10ML suspension Take 10 mLs (1 g total) by mouth 2 (two) times daily. 420 mL 5   No current facility-administered medications for this visit.    Facility-Administered Medications Ordered in Other Visits  Medication Dose Route Frequency Provider Last Rate Last Dose  . 0.9 %  sodium chloride infusion  250 mL Intravenous Once Akshath Mccarey G, MD      . furosemide (LASIX) injection 20 mg  20 mg Intravenous Once Letica Giaimo G, MD      . heparin lock flush 100 unit/mL  500 Units Intracatheter Daily PRN Ardath Sax, MD      .  heparin lock flush 100 unit/mL  250 Units Intracatheter PRN  Miliano Cotten G, MD      . sodium chloride flush (NS) 0.9 % injection 10 mL  10 mL Intracatheter PRN [place] Arroyave G, MD      . sodium chloride flush (NS) 0.9 % injection 3 mL  3 mL Intracatheter PRN Lebron Conners, Marinell Blight, MD        Review of Systems: Review of Systems  Musculoskeletal: Positive for neck pain.  Neurological: Positive for light-headedness.  All other systems reviewed and are negative.    PHYSICAL EXAMINATION Blood pressure (!) 91/40, pulse 83, temperature 97.6 F (36.4 C), temperature source Oral, resp. rate 18, height _0  (1.6 m), weight 139 lb 6.4 oz (63.2 kg), SpO2 100 %.  ECOG PERFORMANCE STATUS: 2 - Symptomatic, <50% confined to bed  Physical Exam  Constitutional: She is oriented to person, place, and time and well-developed, well-nourished, and in no distress. No distress.  HENT:  Head: Normocephalic and atraumatic.  Mouth/Throat: Oropharynx is clear and moist. No oropharyngeal exudate.  Eyes: Conjunctivae and EOM are normal. Pupils are equal, round, and reactive to light. No scleral icterus.  Neck: Normal range of motion. Neck supple. No thyromegaly present.  Cardiovascular: Normal rate, regular rhythm and normal heart sounds.  No murmur heard. Pulmonary/Chest: Effort normal and breath sounds normal. No respiratory distress. She has no wheezes. She has no rales.  Abdominal: Soft. Bowel sounds are normal. She exhibits no distension and no mass. There is no tenderness. There is no rebound and no guarding.  Musculoskeletal: Normal range of motion. She exhibits no edema.  Lymphadenopathy:    She has no cervical adenopathy.  Neurological: She is alert and oriented to person, place, and time. She has normal reflexes. No cranial nerve deficit.  Skin: Skin is warm and dry. No rash noted. She is not diaphoretic. No erythema.     LABORATORY DATA: I have personally reviewed the data as listed: Hospital Outpatient Visit on 09/04/2017  Component Date Value Ref  Range Status  . Order Confirmation 09/16/2017 ORDER PROCESSED BY BLOOD BANK   Final  . ABO/RH(D) 09/16/2017 B POS   Final  . Antibody Screen 09/16/2017 POS   Final  . Sample Expiration 09/16/2017 09/19/2017   Final  . DAT, IgG 09/16/2017 POS   Final  Appointment on 09/03/2017  Component Date Value Ref Range Status  . WBC 09/03/2017 3.5* 3.9 - 10.3 10e3/uL Final  . NEUT# 09/03/2017 2.3  1.5 - 6.5 10e3/uL Final  . HGB 09/03/2017 9.8* 11.6 - 15.9 g/dL Final  . HCT 09/03/2017 30.0* 34.8 - 46.6 % Final  . Platelets 09/03/2017 266  145 - 400 10e3/uL Final  . MCV 09/03/2017 102.7* 79.5 - 101.0 fL Final  . MCH 09/03/2017 33.6  25.1 - 34.0 pg Final  . MCHC 09/03/2017 32.7  31.5 - 36.0 g/dL Final  . RBC 09/03/2017 2.92* 3.70 - 5.45 10e6/uL Final  . RDW 09/03/2017 24.4* 11.2 - 14.5 % Final  . lymph# 09/03/2017 0.8* 0.9 - 3.3 10e3/uL Final  . MONO# 09/03/2017 0.3  0.1 - 0.9 10e3/uL Final  . Eosinophils Absolute 09/03/2017 0.0  0.0 - 0.5 10e3/uL Final  . Basophils Absolute 09/03/2017 0.1  0.0 - 0.1 10e3/uL Final  . NEUT% 09/03/2017 66.5  38.4 - 76.8 % Final  . LYMPH% 09/03/2017 23.0  14.0 - 49.7 % Final  . MONO% 09/03/2017 8.5  0.0 - 14.0 % Final  . EOS% 09/03/2017 0.0  0.0 -  7.0 % Final  . BASO% 09/03/2017 2.0  0.0 - 2.0 % Final  . nRBC 09/03/2017 0  0 - 0 % Final  . Sodium 09/03/2017 141  136 - 145 mEq/L Final  . Potassium 09/03/2017 3.9  3.5 - 5.1 mEq/L Final  . Chloride 09/03/2017 105  98 - 109 mEq/L Final  . CO2 09/03/2017 25  22 - 29 mEq/L Final  . Glucose 09/03/2017 114  70 - 140 mg/dl Final   Glucose reference range is for nonfasting patients. Fasting glucose reference range is 70- 100.  Marland Kitchen BUN 09/03/2017 27.4* 7.0 - 26.0 mg/dL Final  . Creatinine 09/03/2017 1.1  0.6 - 1.1 mg/dL Final  . Total Bilirubin 09/03/2017 0.70  0.20 - 1.20 mg/dL Final  . Alkaline Phosphatase 09/03/2017 71  40 - 150 U/L Final  . AST 09/03/2017 22  5 - 34 U/L Final  . ALT 09/03/2017 30  0 - 55 U/L Final  .  Total Protein 09/03/2017 7.3  6.4 - 8.3 g/dL Final  . Albumin 09/03/2017 3.8  3.5 - 5.0 g/dL Final  . Calcium 09/03/2017 9.3  8.4 - 10.4 mg/dL Final  . Anion Gap 09/03/2017 11  3 - 11 mEq/L Final  . EGFR 09/03/2017 47* >60 ml/min/1.73 m2 Final   eGFR is calculated using the CKD-EPI Creatinine Equation (2009)  . LDH 09/03/2017 267* 125 - 245 U/L Final  . Uric Acid, Serum 09/03/2017 7.0  2.6 - 7.4 mg/dl Final  . Ferritin 09/03/2017 1,629* 9 - 269 ng/ml Final  . Iron 09/03/2017 256* 41 - 142 ug/dL Final  . TIBC 09/03/2017 259  236 - 444 ug/dL Final  . UIBC 09/03/2017 3* 120 - 384 ug/dL Final  . %SAT 09/03/2017 99* 21 - 57 % Final  Office Visit on 09/02/2017  Component Date Value Ref Range Status  . MICRO NUMBER: 09/03/2017 [id number]   Final  . SPECIMEN QUALITY: 09/03/2017 ADEQUATE   Final  . Source 09/03/2017 STOOL   Final  . STATUS: 09/03/2017 FINAL   Final  . [place] ANTIGEN 09/03/2017 Not Detected   Final  . TOXIN A AND B 09/03/2017 Not Detected   Final  . COMMENT 09/03/2017 No toxigenic C. difficile detected For additional information, please refer to http://education.[facility].com/faq/FAQ136 (This link is being provided for informational/educational purposes only.)   Final  . Vit D, 25-Hydroxy 09/02/2017 56  30 - 100 ng/mL Final   Comment: Vitamin D Status         25-OH Vitamin D: . Deficiency:                    <20 ng/mL Insufficiency:             20 - 29 ng/mL Optimal:                 > or = 30 ng/mL . For 25-OH Vitamin D testing on patients on  D2-supplementation and patients for whom quantitation  of D2 and D3 fractions is required, the [facility](TM) 25-OH VIT D, (D2,D3), LC/MS/MS is recommended: order  code [phone] (patients >67yr). . For more information on this test, go to: http://education.[facility].com/faq/FAQ163 (This link is being provided for  informational/educational purposes only.)   . Creat 09/02/2017 1.08* 0.60 - 0.88 mg/dL Final   Comment: For  patients >448years of age, the reference limit for Creatinine is approximately 13% higher for people identified as African-American. .   . BUN 09/02/2017 27* 7 - 25 mg/dL Final  Abstract  on 08/29/2017  Component Date Value Ref Range Status  . Hemoglobin 03/14/2017 8.8* 12.0 - 16.0 Final  . HCT 03/14/2017 26* 36 - 46 Final  . Platelets 03/14/2017 266  150 - 399 Final  . Glucose 03/14/2017 95   Final  . BUN 03/14/2017 24* 4 - 21 Final  . Creatinine 03/14/2017 1.0  0.5 - 1.1 Final  . Potassium 03/14/2017 4.8  3.4 - 5.3 Final  . Sodium 03/14/2017 138  137 - 147 Final  . Alkaline Phosphatase 03/14/2017 55  25 - 125 Final  . ALT 03/14/2017 12  7 - 35 Final  . AST 03/14/2017 14  13 - 35 Final  . Bilirubin, Total 03/14/2017 0.5   Final  . Triglycerides 02/11/2017 194* 40 - 160 Final  . Cholesterol 02/11/2017 174  0 - 200 Final  . HDL 02/11/2017 37  35 - 70 Final  . LDL Cholesterol 02/11/2017 106   Final       Ardath Sax, MD

## 2017-09-16 NOTE — Telephone Encounter (Addendum)
Patient was taking 20 mg extended release twice daily per pill that [place] looked up (APO-020) although the bottle said 40 mg twice daily   [place] states in transition from [place] to [place] she is not sure who put Omeprazole 20 mg pills in a 40 mg bottle. Patient is stable on current dose of 20 mg twice daily  RX sent

## 2017-09-16 NOTE — Telephone Encounter (Signed)
Cameron Sprang called on patient's behalf, rx was sent in yesterday for Omeprazole 40 mg twice daily, when patient picked up rx the pill looked totally different from previous omeprazole rx.   Lamona Curl looked up previous pill and it came up as a 20 mg extended release that patient was taking twice daily. Although the bottle said 40 mg twice daily and that is why the patient told us that was the dose she was taking.  Lamona Curl would like to know how you would like the patient to proceeded with taking medication.   Please advise

## 2017-09-16 NOTE — Telephone Encounter (Signed)
Continue 40mg  BID for now and will address at next ov

## 2017-09-16 NOTE — Assessment & Plan Note (Signed)
83 y.o. female with del 5q MDS diagnosis, currently managed supportive care risk over initiation of lenalidomide therapy based on the previous history of DVT, peptic ulcer disease, and GI bleeding in addition to multiple other comorbidities including pulmonary fibrosis, congestive heart failure, and atrial fibrillation. Patient underwent appropriately usual evaluation locally confirming presence of myelodysplastic syndrome. Our additional evaluation demonstrated reasonably high erythropoietin level making supplementation unlikely to be helpful in relieving anemia.  At the present time, patient is requiring infrequent transfusions. She required two units over the past month. Labral demonstrates progressive elevation of ferritin consistent with iron overload due to recurrent transfusions as well as inefficient erythropoiesis. I do recommend iron chelation with Exjade to reduce potential negative impact of iron overload.   Plan: --Audiology/ophthalmology evaluation --ECHO --Decrease lab frequency to [facility] based on decision requirements over the past month.  --Continue transfusion support: 1U pRBCs for Hgb <=9.0g/dL, all products leukoreduced and irradiated; --Labs prior to RTC including ferritin values

## 2017-09-16 NOTE — Progress Notes (Addendum)
Lab called requesting more samples of blood for patient due to her having antibodies and blood transfusion will need to be rescheduled for tomorrow 09/16/17. Patient's MD Dr. Lebron Conners made aware. RN also notified patient's Daughter in law. Both patient and patient's daughter in law advised not to removed blue blood bank bracelet in order to receive transfusion on tomorrow. Labs were drawn and sent to laboratory. Patient was rescheduled for 10:30 am at the [facility] [place].

## 2017-09-16 NOTE — Discharge Instructions (Signed)
Patient's labs drawn and sent to pharmacy. Patient for 1 unit of blood on 09/16/17.

## 2017-09-17 ENCOUNTER — Telehealth (HOSPITAL_COMMUNITY): Payer: Self-pay

## 2017-09-17 ENCOUNTER — Other Ambulatory Visit: Payer: Medicare Other

## 2017-09-17 ENCOUNTER — Ambulatory Visit (HOSPITAL_COMMUNITY)
Admission: RE | Admit: 2017-09-17 | Discharge: 2017-09-17 | Disposition: A | Discharge status: Home / Self Care | Payer: Medicare Other | Source: Ambulatory Visit | Attending: Hematology and Oncology | Admitting: Hematology and Oncology

## 2017-09-17 DIAGNOSIS — D649 Anemia, unspecified: Secondary | ICD-10-CM | POA: Diagnosis not present

## 2017-09-17 LAB — PREPARE RBC (CROSSMATCH)

## 2017-09-17 MED ORDER — OMEPRAZOLE 20 MG PO CPDR: 20.0000 mg | DELAYED_RELEASE_CAPSULE | Freq: Two times a day (BID) | ORAL | 2 refills | Status: DC

## 2017-09-17 MED ORDER — SODIUM CHLORIDE 0.9% FLUSH: 10.0000 mL | INTRAVENOUS | Status: DC | PRN

## 2017-09-18 LAB — TYPE AND SCREEN
ABO/RH(D): B POS
ANTIBODY SCREEN: POSITIVE
DAT, IgG: POSITIVE
Donor AG Type: NEGATIVE
UNIT DIVISION: 0

## 2017-09-18 LAB — BPAM RBC
BLOOD PRODUCT EXPIRATION DATE: 201811272359
ISSUE DATE / TIME: 201811141054
Unit Type and Rh: 7300

## 2017-09-23 ENCOUNTER — Ambulatory Visit (HOSPITAL_COMMUNITY)
Admission: RE | Admit: 2017-09-23 | Discharge: 2017-09-23 | Disposition: A | Discharge status: Home / Self Care | Payer: Medicare Other | Source: Ambulatory Visit | Attending: Hematology and Oncology | Admitting: Hematology and Oncology

## 2017-09-23 ENCOUNTER — Other Ambulatory Visit (HOSPITAL_COMMUNITY): Payer: Medicare Other

## 2017-09-23 DIAGNOSIS — I4891 Unspecified atrial fibrillation: Secondary | ICD-10-CM | POA: Insufficient documentation

## 2017-09-23 DIAGNOSIS — I509 Heart failure, unspecified: Secondary | ICD-10-CM | POA: Diagnosis not present

## 2017-09-23 DIAGNOSIS — Z950 Presence of cardiac pacemaker: Secondary | ICD-10-CM | POA: Diagnosis not present

## 2017-09-23 DIAGNOSIS — I082 Rheumatic disorders of both aortic and tricuspid valves: Secondary | ICD-10-CM | POA: Diagnosis not present

## 2017-09-23 DIAGNOSIS — E785 Hyperlipidemia, unspecified: Secondary | ICD-10-CM | POA: Insufficient documentation

## 2017-09-23 DIAGNOSIS — J84112 Idiopathic pulmonary fibrosis: Secondary | ICD-10-CM | POA: Diagnosis not present

## 2017-09-23 DIAGNOSIS — Z86711 Personal history of pulmonary embolism: Secondary | ICD-10-CM | POA: Diagnosis not present

## 2017-09-23 NOTE — Progress Notes (Signed)
  Echocardiogram 2D Echocardiogram has been performed.  Kimberly Horn 09/23/2017, 12:22 PM

## 2017-10-01 ENCOUNTER — Other Ambulatory Visit (HOSPITAL_BASED_OUTPATIENT_CLINIC_OR_DEPARTMENT_OTHER): Payer: Medicare Other

## 2017-10-01 ENCOUNTER — Ambulatory Visit: Payer: Medicare Other

## 2017-10-01 ENCOUNTER — Telehealth: Payer: Self-pay

## 2017-10-01 DIAGNOSIS — K921 Melena: Secondary | ICD-10-CM

## 2017-10-01 DIAGNOSIS — D46C Myelodysplastic syndrome with isolated del(5q) chromosomal abnormality: Secondary | ICD-10-CM | POA: Diagnosis present

## 2017-10-01 DIAGNOSIS — D619 Aplastic anemia, unspecified: Secondary | ICD-10-CM

## 2017-10-01 LAB — CBC & DIFF AND RETIC
BASO%: 1.5 % (ref 0.0–2.0)
BASOS ABS: 0.1 10*3/uL (ref 0.0–0.1)
EOS%: 0 % (ref 0.0–7.0)
Eosinophils Absolute: 0 10*3/uL (ref 0.0–0.5)
HEMATOCRIT: 28.3 % — AB (ref 34.8–46.6)
HEMOGLOBIN: 9.3 g/dL — AB (ref 11.6–15.9)
Immature Retic Fract: 12.3 % — ABNORMAL HIGH (ref 1.60–10.00)
LYMPH%: 35.5 % (ref 14.0–49.7)
MCH: 35.5 pg — AB (ref 25.1–34.0)
MCHC: 32.9 g/dL (ref 31.5–36.0)
MCV: 108 fL — AB (ref 79.5–101.0)
MONO#: 0.5 10*3/uL (ref 0.1–0.9)
MONO%: 11.6 % (ref 0.0–14.0)
NEUT#: 2.4 10*3/uL (ref 1.5–6.5)
NEUT%: 51.4 % (ref 38.4–76.8)
NRBC: 1 % — AB (ref 0–0)
Platelets: 314 10*3/uL (ref 145–400)
RBC: 2.62 10*6/uL — ABNORMAL LOW (ref 3.70–5.45)
RDW: 24.2 % — AB (ref 11.2–14.5)
Retic %: 2.01 % (ref 0.70–2.10)
Retic Ct Abs: 52.66 10*3/uL (ref 33.70–90.70)
WBC: 4.7 10*3/uL (ref 3.9–10.3)
lymph#: 1.7 10*3/uL (ref 0.9–3.3)

## 2017-10-01 NOTE — Progress Notes (Signed)
Per MD, no blood transfusion needed today with HGB of 9.3. Patient and family voiced understanding. Patient discharged to home. Patient and vital signs stable at time of discharge.   Wylene Simmer, BSN, RN 10/01/2017 2:26 PM

## 2017-10-01 NOTE — Telephone Encounter (Signed)
Dr. Lebron Conners made aware of lab results. Hold tube cancelled. No blood transfusion today per Dr. Lebron Conners.

## 2017-10-03 NOTE — Telephone Encounter (Signed)
Don't know why this is in the chart and I am unable to delete

## 2017-10-06 ENCOUNTER — Ambulatory Visit (HOSPITAL_COMMUNITY)
Admission: RE | Admit: 2017-10-06 | Discharge: 2017-10-06 | Disposition: A | Discharge status: Home / Self Care | Payer: Medicare Other | Source: Ambulatory Visit | Attending: Hematology and Oncology | Admitting: Hematology and Oncology

## 2017-10-06 DIAGNOSIS — D62 Acute posthemorrhagic anemia: Secondary | ICD-10-CM

## 2017-10-15 ENCOUNTER — Other Ambulatory Visit (HOSPITAL_BASED_OUTPATIENT_CLINIC_OR_DEPARTMENT_OTHER): Payer: Medicare Other

## 2017-10-15 ENCOUNTER — Ambulatory Visit (HOSPITAL_COMMUNITY)
Admission: RE | Admit: 2017-10-15 | Discharge: 2017-10-15 | Disposition: A | Discharge status: Home / Self Care | Payer: Medicare Other | Source: Ambulatory Visit | Attending: Hematology | Admitting: Hematology

## 2017-10-15 ENCOUNTER — Other Ambulatory Visit: Payer: Self-pay

## 2017-10-15 ENCOUNTER — Ambulatory Visit (HOSPITAL_BASED_OUTPATIENT_CLINIC_OR_DEPARTMENT_OTHER): Payer: Medicare Other

## 2017-10-15 DIAGNOSIS — D649 Anemia, unspecified: Secondary | ICD-10-CM

## 2017-10-15 DIAGNOSIS — K921 Melena: Secondary | ICD-10-CM

## 2017-10-15 DIAGNOSIS — D46C Myelodysplastic syndrome with isolated del(5q) chromosomal abnormality: Secondary | ICD-10-CM

## 2017-10-15 DIAGNOSIS — D619 Aplastic anemia, unspecified: Secondary | ICD-10-CM

## 2017-10-15 DIAGNOSIS — D62 Acute posthemorrhagic anemia: Secondary | ICD-10-CM | POA: Diagnosis not present

## 2017-10-15 LAB — CBC & DIFF AND RETIC
BASO%: 1.6 % (ref 0.0–2.0)
Basophils Absolute: 0.1 10*3/uL (ref 0.0–0.1)
EOS ABS: 0 10*3/uL (ref 0.0–0.5)
EOS%: 0 % (ref 0.0–7.0)
HEMATOCRIT: 26.4 % — AB (ref 34.8–46.6)
HGB: 8.5 g/dL — ABNORMAL LOW (ref 11.6–15.9)
IMMATURE RETIC FRACT: 14.6 % — AB (ref 1.60–10.00)
LYMPH#: 0.9 10*3/uL (ref 0.9–3.3)
LYMPH%: 24.7 % (ref 14.0–49.7)
MCH: 36.5 pg — ABNORMAL HIGH (ref 25.1–34.0)
MCHC: 32.2 g/dL (ref 31.5–36.0)
MCV: 113.3 fL — ABNORMAL HIGH (ref 79.5–101.0)
MONO#: 0.3 10*3/uL (ref 0.1–0.9)
MONO%: 9.2 % (ref 0.0–14.0)
NEUT#: 2.4 10*3/uL (ref 1.5–6.5)
NEUT%: 64.5 % (ref 38.4–76.8)
Platelets: 303 10*3/uL (ref 145–400)
RBC: 2.33 10*6/uL — AB (ref 3.70–5.45)
RETIC CT ABS: 58.95 10*3/uL (ref 33.70–90.70)
Retic %: 2.53 % — ABNORMAL HIGH (ref 0.70–2.10)
WBC: 3.7 10*3/uL — ABNORMAL LOW (ref 3.9–10.3)
nRBC: 0 % (ref 0–0)

## 2017-10-15 LAB — COMPREHENSIVE METABOLIC PANEL
ALT: 19 U/L (ref 0–55)
AST: 16 U/L (ref 5–34)
Albumin: 3.7 g/dL (ref 3.5–5.0)
Alkaline Phosphatase: 67 U/L (ref 40–150)
Anion Gap: 10 mEq/L (ref 3–11)
BILIRUBIN TOTAL: 0.5 mg/dL (ref 0.20–1.20)
BUN: 15.6 mg/dL (ref 7.0–26.0)
CO2: 27 meq/L (ref 22–29)
Calcium: 9.3 mg/dL (ref 8.4–10.4)
Chloride: 105 mEq/L (ref 98–109)
Creatinine: 0.9 mg/dL (ref 0.6–1.1)
EGFR: 56 mL/min/{1.73_m2} — AB (ref >60)
Glucose: 120 mg/dl (ref 70–140)
Potassium: 4.4 mEq/L (ref 3.5–5.1)
SODIUM: 143 meq/L (ref 136–145)
TOTAL PROTEIN: 7 g/dL (ref 6.4–8.3)

## 2017-10-15 LAB — PREPARE RBC (CROSSMATCH)

## 2017-10-15 LAB — IRON AND TIBC
%SAT: 92 % — AB (ref 21–57)
Iron: 236 ug/dL — ABNORMAL HIGH (ref 41–142)
TIBC: 258 ug/dL (ref 236–444)
UIBC: 22 ug/dL — ABNORMAL LOW (ref 120–384)

## 2017-10-15 LAB — FERRITIN

## 2017-10-15 MED ORDER — SODIUM CHLORIDE 0.9 % IV SOLN
250.0000 mL | Freq: Once | INTRAVENOUS | Status: AC
  Administered 2017-10-15: 250 mL via INTRAVENOUS

## 2017-10-15 MED ORDER — ACETAMINOPHEN 325 MG PO TABS
ORAL_TABLET | ORAL | Status: AC
  Filled 2017-10-15: qty 2

## 2017-10-15 MED ORDER — FUROSEMIDE 10 MG/ML IJ SOLN: 20.0000 mg | Freq: Once | INTRAMUSCULAR | Status: DC

## 2017-10-15 MED ORDER — SODIUM CHLORIDE 0.9% FLUSH
3.0000 mL | INTRAVENOUS | Status: DC | PRN
  Filled 2017-10-15: qty 10

## 2017-10-15 MED ORDER — ACETAMINOPHEN 325 MG PO TABS
650.0000 mg | ORAL_TABLET | Freq: Once | ORAL | Status: AC
  Administered 2017-10-15: 650 mg via ORAL

## 2017-10-15 NOTE — Progress Notes (Signed)
Blood transfusion rescheduled to Friday at 1200. Son notified, instructed to not remove blue blood bracelet.

## 2017-10-17 ENCOUNTER — Ambulatory Visit (HOSPITAL_BASED_OUTPATIENT_CLINIC_OR_DEPARTMENT_OTHER): Payer: Medicare Other

## 2017-10-17 DIAGNOSIS — D62 Acute posthemorrhagic anemia: Secondary | ICD-10-CM | POA: Diagnosis not present

## 2017-10-17 DIAGNOSIS — D46C Myelodysplastic syndrome with isolated del(5q) chromosomal abnormality: Secondary | ICD-10-CM

## 2017-10-17 DIAGNOSIS — D649 Anemia, unspecified: Secondary | ICD-10-CM

## 2017-10-17 LAB — PREPARE RBC (CROSSMATCH)

## 2017-10-17 MED ORDER — SODIUM CHLORIDE 0.9 % IV SOLN
250.0000 mL | Freq: Once | INTRAVENOUS | Status: AC
  Administered 2017-10-17: 250 mL via INTRAVENOUS

## 2017-10-17 NOTE — Patient Instructions (Signed)

## 2017-10-19 LAB — TYPE AND SCREEN
ABO/RH(D): B POS
ANTIBODY SCREEN: POSITIVE
DAT, IgG: NEGATIVE
DONOR AG TYPE: NEGATIVE
Unit division: 0

## 2017-10-19 LAB — BPAM RBC
BLOOD PRODUCT EXPIRATION DATE: 201812312359
ISSUE DATE / TIME: 201812141244
UNIT TYPE AND RH: 7300

## 2017-10-29 ENCOUNTER — Other Ambulatory Visit (HOSPITAL_BASED_OUTPATIENT_CLINIC_OR_DEPARTMENT_OTHER): Payer: Medicare Other

## 2017-10-29 ENCOUNTER — Other Ambulatory Visit: Payer: Self-pay

## 2017-10-29 ENCOUNTER — Ambulatory Visit: Payer: Medicare Other

## 2017-10-29 DIAGNOSIS — D62 Acute posthemorrhagic anemia: Secondary | ICD-10-CM

## 2017-10-29 DIAGNOSIS — D46C Myelodysplastic syndrome with isolated del(5q) chromosomal abnormality: Secondary | ICD-10-CM | POA: Diagnosis present

## 2017-10-29 DIAGNOSIS — D619 Aplastic anemia, unspecified: Secondary | ICD-10-CM

## 2017-10-29 DIAGNOSIS — K921 Melena: Secondary | ICD-10-CM

## 2017-10-29 LAB — CBC & DIFF AND RETIC
BASO%: 2.2 % — ABNORMAL HIGH (ref 0.0–2.0)
BASOS ABS: 0.1 10*3/uL (ref 0.0–0.1)
EOS%: 0.4 % (ref 0.0–7.0)
Eosinophils Absolute: 0 10*3/uL (ref 0.0–0.5)
HEMATOCRIT: 26.3 % — AB (ref 34.8–46.6)
HEMOGLOBIN: 8.7 g/dL — AB (ref 11.6–15.9)
Immature Retic Fract: 16 % — ABNORMAL HIGH (ref 1.60–10.00)
LYMPH%: 28.3 % (ref 14.0–49.7)
MCH: 36.6 pg — AB (ref 25.1–34.0)
MCHC: 33.1 g/dL (ref 31.5–36.0)
MCV: 110.8 fL — AB (ref 79.5–101.0)
MONO#: 0.5 10*3/uL (ref 0.1–0.9)
MONO%: 11.4 % (ref 0.0–14.0)
NEUT#: 2.5 10*3/uL (ref 1.5–6.5)
NEUT%: 57.7 % (ref 38.4–76.8)
Platelets: 253 10*3/uL (ref 145–400)
RBC: 2.38 10*6/uL — ABNORMAL LOW (ref 3.70–5.45)
RDW: 25.7 % — ABNORMAL HIGH (ref 11.2–14.5)
Retic %: 2.28 % — ABNORMAL HIGH (ref 0.70–2.10)
Retic Ct Abs: 54.26 10*3/uL (ref 33.70–90.70)
WBC: 4.3 10*3/uL (ref 3.9–10.3)
lymph#: 1.2 10*3/uL (ref 0.9–3.3)
nRBC: 0 % (ref 0–0)

## 2017-10-29 LAB — TECHNOLOGIST REVIEW

## 2017-10-29 LAB — PREPARE RBC (CROSSMATCH)

## 2017-10-29 MED ORDER — SODIUM CHLORIDE 0.9 % IV SOLN: 250.0000 mL | Freq: Once | INTRAVENOUS | Status: DC

## 2017-10-29 NOTE — Patient Instructions (Addendum)
Blood Transfusion, Care After This sheet gives you information about how to care for yourself after your procedure. Your doctor may also give you more specific instructions. If you have problems or questions, contact your doctor. Follow these instructions at home:  Take over-the-counter and prescription medicines only as told by your doctor.  Go back to your normal activities as told by your doctor.  Follow instructions from your doctor about how to take care of the area where an IV tube was put into your vein (insertion site). Make sure you: ? Wash your hands with soap and water before you change your bandage (dressing). If there is no soap and water, use hand sanitizer. ? Change your bandage as told by your doctor.  Check your IV insertion site every day for signs of infection. Check for: ? More redness, swelling, or pain. ? More fluid or blood. ? Warmth. ? Pus or a bad smell. Contact a doctor if:  You have more redness, swelling, or pain around the IV insertion site..  You have more fluid or blood coming from the IV insertion site.  Your IV insertion site feels warm to the touch.  You have pus or a bad smell coming from the IV insertion site.  Your pee (urine) turns pink, red, or brown.  You feel weak after doing your normal activities. Get help right away if:  You have signs of a serious allergic or body defense (immune) system reaction, including: ? Itchiness. ? Hives. ? Trouble breathing. ? Anxiety. ? Pain in your chest or lower back. ? Fever, flushing, and chills. ? Fast pulse. ? Rash. ? Watery poop (diarrhea). ? Throwing up (vomiting). ? Dark pee. ? Serious headache. ? Dizziness. ? Stiff neck. ? Yellow color in your face or the white parts of your eyes (jaundice). Summary  After a blood transfusion, return to your normal activities as told by your doctor.  Every day, check for signs of infection where the IV tube was put into your vein.  Some signs of  infection are warm skin, more redness and pain, more fluid or blood, and pus or a bad smell where the needle went in.  Contact your doctor if you feel weak or have any unusual symptoms. This information is not intended to replace advice given to you by your health care provider. Make sure you discuss any questions you have with your health care provider. Document Released: 11/11/2014 Document Revised: 06/14/2016 Document Reviewed: 06/14/2016 Elsevier Interactive Patient Education  2017 Elsevier Inc.     Anemia Anemia is a condition in which you do not have enough red blood cells or hemoglobin. Hemoglobin is a substance in red blood cells that carries oxygen. When you do not have enough red blood cells or hemoglobin (are anemic), your body cannot get enough oxygen and your organs may not work properly. As a result, you may feel very tired or have other problems. What are the causes? Common causes of anemia include:  Excessive bleeding. Anemia can be caused by excessive bleeding inside or outside the body, including bleeding from the intestine or from periods in women.  Poor nutrition.  Long-lasting (chronic) kidney, thyroid, and liver disease.  Bone marrow disorders.  Cancer and treatments for cancer.  HIV (human immunodeficiency virus) and AIDS (acquired immunodeficiency syndrome).  Treatments for HIV and AIDS.  Spleen problems.  Blood disorders.  Infections, medicines, and autoimmune disorders that destroy red blood cells.  What are the signs or symptoms? Symptoms of this condition   include:  Minor weakness.  Dizziness.  Headache.  Feeling heartbeats that are irregular or faster than normal (palpitations).  Shortness of breath, especially with exercise.  Paleness.  Cold sensitivity.  Indigestion.  Nausea.  Difficulty sleeping.  Difficulty concentrating.  Symptoms may occur suddenly or develop slowly. If your anemia is mild, you may not have symptoms. How  is this diagnosed? This condition is diagnosed based on:  Blood tests.  Your medical history.  A physical exam.  Bone marrow biopsy.  Your health care provider may also check your stool (feces) for blood and may do additional testing to look for the cause of your bleeding. You may also have other tests, including:  Imaging tests, such as a CT scan or MRI.  Endoscopy.  Colonoscopy.  How is this treated? Treatment for this condition depends on the cause. If you continue to lose a lot of blood, you may need to be treated at a hospital. Treatment may include:  Taking supplements of iron, vitamin B12, or folic acid.  Taking a hormone medicine (erythropoietin) that can help to stimulate red blood cell growth.  Having a blood transfusion. This may be needed if you lose a lot of blood.  Making changes to your diet.  Having surgery to remove your spleen.  Follow these instructions at home:  Take over-the-counter and prescription medicines only as told by your health care provider.  Take supplements only as told by your health care provider.  Follow any diet instructions that you were given.  Keep all follow-up visits as told by your health care provider. This is important. Contact a health care provider if:  You develop new bleeding anywhere in the body. Get help right away if:  You are very weak.  You are short of breath.  You have pain in your abdomen or chest.  You are dizzy or feel faint.  You have trouble concentrating.  You have bloody or black, tarry stools.  You vomit repeatedly or you vomit up blood. Summary  Anemia is a condition in which you do not have enough red blood cells or enough of a substance in your red blood cells that carries oxygen (hemoglobin).  Symptoms may occur suddenly or develop slowly.  If your anemia is mild, you may not have symptoms.  This condition is diagnosed with blood tests as well as a medical history and physical exam.  Other tests may be needed.  Treatment for this condition depends on the cause of the anemia. This information is not intended to replace advice given to you by your health care provider. Make sure you discuss any questions you have with your health care provider. Document Released: 11/28/2004 Document Revised: 11/22/2016 Document Reviewed: 11/22/2016 Elsevier Interactive Patient Education  2018 Elsevier Inc.   

## 2017-10-30 LAB — TYPE AND SCREEN
ABO/RH(D): B POS
Antibody Screen: POSITIVE
DAT, IgG: NEGATIVE
Donor AG Type: NEGATIVE
UNIT DIVISION: 0

## 2017-10-30 LAB — BPAM RBC
BLOOD PRODUCT EXPIRATION DATE: 201901112359
ISSUE DATE / TIME: 201812261705
Unit Type and Rh: 5100

## 2017-10-31 ENCOUNTER — Encounter: Payer: Self-pay | Admitting: Internal Medicine

## 2017-11-05 ENCOUNTER — Ambulatory Visit: Payer: Medicare Other | Admitting: Internal Medicine

## 2017-11-05 ENCOUNTER — Ambulatory Visit (HOSPITAL_COMMUNITY)
Admission: RE | Admit: 2017-11-05 | Discharge: 2017-11-05 | Disposition: A | Discharge status: Home / Self Care | Payer: Medicare Other | Source: Ambulatory Visit | Attending: Hematology and Oncology | Admitting: Hematology and Oncology

## 2017-11-05 DIAGNOSIS — D619 Aplastic anemia, unspecified: Secondary | ICD-10-CM | POA: Insufficient documentation

## 2017-11-05 DIAGNOSIS — D46C Myelodysplastic syndrome with isolated del(5q) chromosomal abnormality: Secondary | ICD-10-CM | POA: Insufficient documentation

## 2017-11-10 ENCOUNTER — Telehealth: Payer: Self-pay | Admitting: Hematology and Oncology

## 2017-11-10 NOTE — Telephone Encounter (Signed)
Patient called in to cancel did not want to reschedule

## 2017-11-12 ENCOUNTER — Other Ambulatory Visit: Payer: Medicare Other

## 2017-11-14 ENCOUNTER — Ambulatory Visit: Payer: Medicare Other | Admitting: Internal Medicine

## 2017-11-19 DIAGNOSIS — I4891 Unspecified atrial fibrillation: Secondary | ICD-10-CM | POA: Diagnosis not present

## 2017-11-19 DIAGNOSIS — Z45018 Encounter for adjustment and management of other part of cardiac pacemaker: Secondary | ICD-10-CM | POA: Diagnosis not present

## 2017-11-26 ENCOUNTER — Other Ambulatory Visit: Payer: Self-pay

## 2017-11-26 ENCOUNTER — Inpatient Hospital Stay: Payer: Medicare Other

## 2017-11-26 ENCOUNTER — Other Ambulatory Visit: Payer: Self-pay | Admitting: Hematology and Oncology

## 2017-11-26 ENCOUNTER — Inpatient Hospital Stay: Payer: Medicare Other | Attending: Hematology and Oncology

## 2017-11-26 DIAGNOSIS — D62 Acute posthemorrhagic anemia: Secondary | ICD-10-CM

## 2017-11-26 DIAGNOSIS — D619 Aplastic anemia, unspecified: Secondary | ICD-10-CM

## 2017-11-26 DIAGNOSIS — D46C Myelodysplastic syndrome with isolated del(5q) chromosomal abnormality: Secondary | ICD-10-CM

## 2017-11-26 DIAGNOSIS — K921 Melena: Secondary | ICD-10-CM

## 2017-11-26 LAB — CBC WITH DIFFERENTIAL (CANCER CENTER ONLY)
BASOS ABS: 0 10*3/uL (ref 0.0–0.1)
BLASTS: 0 %
Band Neutrophils: 0 %
Basophils Relative: 0 %
Eosinophils Absolute: 0 10*3/uL (ref 0.0–0.5)
Eosinophils Relative: 1 %
HEMATOCRIT: 25.5 % — AB (ref 34.8–46.6)
Hemoglobin: 8.1 g/dL — ABNORMAL LOW (ref 11.6–15.9)
Lymphocytes Relative: 24 %
Lymphs Abs: 0.9 10*3/uL (ref 0.9–3.3)
MCH: 35.7 pg — ABNORMAL HIGH (ref 25.1–34.0)
MCHC: 31.8 g/dL (ref 31.5–36.0)
MCV: 112.3 fL — AB (ref 79.5–101.0)
MONOS PCT: 10 %
Metamyelocytes Relative: 0 %
Monocytes Absolute: 0.4 10*3/uL (ref 0.1–0.9)
Myelocytes: 0 %
NEUTROS ABS: 2.4 10*3/uL (ref 1.5–6.5)
NEUTROS PCT: 65 %
OTHER: 0 %
Platelet Count: 243 10*3/uL (ref 145–400)
Promyelocytes Absolute: 0 %
RBC: 2.27 MIL/uL — AB (ref 3.70–5.45)
RDW: UNDETERMINED % (ref 11.2–16.1)
WBC: 3.7 10*3/uL — AB (ref 3.9–10.3)
nRBC: 0 /100 WBC

## 2017-11-26 LAB — IRON AND TIBC
Iron: 242 ug/dL — ABNORMAL HIGH (ref 41–142)
SATURATION RATIOS: 96 % — AB (ref 21–57)
TIBC: 253 ug/dL (ref 236–444)
UIBC: 11 ug/dL

## 2017-11-26 LAB — COMPREHENSIVE METABOLIC PANEL
ALT: 18 U/L (ref 0–55)
ANION GAP: 8 (ref 3–11)
AST: 15 U/L (ref 5–34)
Albumin: 3.7 g/dL (ref 3.5–5.0)
Alkaline Phosphatase: 62 U/L (ref 40–150)
BILIRUBIN TOTAL: 0.6 mg/dL (ref 0.2–1.2)
BUN: 27 mg/dL — ABNORMAL HIGH (ref 7–26)
CO2: 28 mmol/L (ref 22–29)
Calcium: 9.1 mg/dL (ref 8.4–10.4)
Chloride: 106 mmol/L (ref 98–109)
Creatinine, Ser: 1.07 mg/dL (ref 0.60–1.10)
GFR calc Af Amer: 53 mL/min — ABNORMAL LOW (ref >60)
GFR calc non Af Amer: 46 mL/min — ABNORMAL LOW (ref >60)
Glucose, Bld: 111 mg/dL (ref 70–140)
POTASSIUM: 4.8 mmol/L — AB (ref 3.3–4.7)
Sodium: 142 mmol/L (ref 136–145)
TOTAL PROTEIN: 6.9 g/dL (ref 6.4–8.3)

## 2017-11-26 LAB — SAMPLE TO BLOOD BANK

## 2017-11-26 LAB — FERRITIN: Ferritin: 1230 ng/mL — ABNORMAL HIGH (ref 9–269)

## 2017-11-26 LAB — RETICULOCYTES
RBC.: 2.27 MIL/uL — ABNORMAL LOW (ref 3.70–5.45)
RETIC COUNT ABSOLUTE: 43.1 10*3/uL (ref 33.7–90.7)
Retic Ct Pct: 1.9 % (ref 0.7–2.1)

## 2017-11-26 LAB — PREPARE RBC (CROSSMATCH)

## 2017-11-26 MED ORDER — ACETAMINOPHEN 325 MG PO TABS
ORAL_TABLET | ORAL | Status: AC
  Filled 2017-11-26: qty 2

## 2017-11-26 MED ORDER — SODIUM CHLORIDE 0.9 % IV SOLN
250.0000 mL | Freq: Once | INTRAVENOUS | Status: AC
  Administered 2017-11-26: 250 mL via INTRAVENOUS

## 2017-11-26 MED ORDER — ACETAMINOPHEN 325 MG PO TABS
650.0000 mg | ORAL_TABLET | Freq: Once | ORAL | Status: AC
  Administered 2017-11-26: 650 mg via ORAL

## 2017-11-26 NOTE — Patient Instructions (Signed)

## 2017-11-27 LAB — TYPE AND SCREEN
ABO/RH(D): B POS
Antibody Screen: POSITIVE
Donor AG Type: NEGATIVE
UNIT DIVISION: 0

## 2017-11-27 LAB — BPAM RBC
BLOOD PRODUCT EXPIRATION DATE: 201902202359
ISSUE DATE / TIME: 201901231708
UNIT TYPE AND RH: 7300

## 2017-11-28 ENCOUNTER — Ambulatory Visit (INDEPENDENT_AMBULATORY_CARE_PROVIDER_SITE_OTHER): Payer: Medicare Other | Admitting: Internal Medicine

## 2017-11-28 ENCOUNTER — Encounter: Payer: Self-pay | Admitting: Hematology and Oncology

## 2017-11-28 ENCOUNTER — Inpatient Hospital Stay: Payer: Medicare Other

## 2017-11-28 ENCOUNTER — Other Ambulatory Visit: Payer: Self-pay

## 2017-11-28 ENCOUNTER — Telehealth: Payer: Self-pay | Admitting: Hematology and Oncology

## 2017-11-28 ENCOUNTER — Inpatient Hospital Stay (HOSPITAL_BASED_OUTPATIENT_CLINIC_OR_DEPARTMENT_OTHER): Payer: Medicare Other | Admitting: Hematology and Oncology

## 2017-11-28 ENCOUNTER — Encounter: Payer: Self-pay | Admitting: Internal Medicine

## 2017-11-28 VITALS — BP 132/70 | Temp 97.6°F | Wt 142.0 lb

## 2017-11-28 VITALS — BP 148/60 | HR 87 | Temp 97.5°F | Resp 16 | Ht 63.0 in | Wt 140.3 lb

## 2017-11-28 DIAGNOSIS — H9193 Unspecified hearing loss, bilateral: Secondary | ICD-10-CM

## 2017-11-28 DIAGNOSIS — E039 Hypothyroidism, unspecified: Secondary | ICD-10-CM

## 2017-11-28 DIAGNOSIS — G894 Chronic pain syndrome: Secondary | ICD-10-CM

## 2017-11-28 DIAGNOSIS — G4701 Insomnia due to medical condition: Secondary | ICD-10-CM | POA: Diagnosis not present

## 2017-11-28 DIAGNOSIS — Z79899 Other long term (current) drug therapy: Secondary | ICD-10-CM

## 2017-11-28 DIAGNOSIS — D619 Aplastic anemia, unspecified: Secondary | ICD-10-CM | POA: Diagnosis not present

## 2017-11-28 DIAGNOSIS — D46C Myelodysplastic syndrome with isolated del(5q) chromosomal abnormality: Secondary | ICD-10-CM | POA: Diagnosis not present

## 2017-11-28 LAB — HEMOGLOBIN AND HEMATOCRIT (CANCER CENTER ONLY)
HEMATOCRIT: 29.2 % — AB (ref 34.8–46.6)
Hemoglobin: 9.7 g/dL — ABNORMAL LOW (ref 11.6–15.9)

## 2017-11-28 LAB — T4, FREE: Free T4: 1.4 ng/dL (ref 0.8–1.8)

## 2017-11-28 LAB — TSH: TSH: 0.81 mIU/L (ref 0.40–4.50)

## 2017-11-28 MED ORDER — HYDROCODONE-ACETAMINOPHEN 5-325 MG PO TABS: 1.0000 | ORAL_TABLET | Freq: Every day | ORAL | 0 refills | Status: DC

## 2017-11-28 NOTE — Telephone Encounter (Signed)
Gave patient relative avs report and appointments for January thru April.  Per desk nurse lab/blood every other week starting next week - patient will not have transfusion today.  Also patient has antibodies and process to prepare blood will take at minium 4hrs. Patient will have lab draw one day prior to transfusion. Patient/relative/Desk nurse aware.

## 2017-11-28 NOTE — Patient Instructions (Signed)
Follow up with hearing specialist as scheduled  Follow up with specialists as scheduled  Will call with lab results  Continue current medications as ordered  Follow up in 3 mos for thyroid, chronic pain, insomnia, anemia

## 2017-11-28 NOTE — Progress Notes (Signed)
Patient ID: Kimberly Horn, female   DOB: 1930/07/20, 84 y.o.   MRN: [id number]   Location:  [facility] OFFICE  Provider: DR Arletha Grippe   Goals of Care:  Advanced Directives 08/06/2017  Does Patient Have a Medical Advance Directive? No  Type of Advance Directive -  Does patient want to make changes to medical advance directive? -  Copy of [place] in Chart? -  Would patient like information on creating a medical advance directive? Yes (MAU/Ambulatory/Procedural Areas - Information given)     Chief Complaint  Patient presents with  . Medical Management of Chronic Issues    2mth follow-up for chronic pain    HPI: Patient is a 84 y.o. female seen today for medical management of chronic diseases.  K 4.8. She has had several PRBCs since last OV. She has a hearing appt on Feb 7th and plans to get more hearing aids.  PAF - stable s/p pacer. eliquis stopped 2/2 anemia.  CHF - stable on prn lasix with KCl. Followed by cardio in [place], [place]  Idiopathic pulmonary fibrosis - O2 dependent @ 3L/min and increases to 4L/min with moderate exertion. Stable on singulair. Has seen pulmonary in the past  Seasonal allergy - stable on singulair. She has allergy to dust and sycamore trees.  Anemia/MDS with 5q deletion - requires frequent PRBCs 2/2 GI bleeds. She had BM bx that revealed MDS with 5q deletion. Followed by hematology Dr Lebron Conners. Hgb 8.1; ferritin 1230; iron 242  Hypothyroidism - stable on levothyroxine. TSH 0.458; T4 free 1.2  PE/DVT hx - stable. No sx's of exacerbation/recurrence. she does not have an IVC filter. eliquis stopped 2/2 anemia  GERD - sx's controlled on omeprazole and carafate. Belching controlled  Chronic pain syndrome - due to back pain. She has a hx back sx. Takes norco qhs and daily cymbalta. Pain controlled  Insomnia - problems falling asleep. controlled on melatonin 10mg   Osteoporosis - takes calcium and Vit D as well as prolia injection every 6  mos  She has a daughter, Kimberly Horn, who is an Regulatory affairs officer in [place], [place]. 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   Past Medical History:  Diagnosis Date  . Anxiety   . Atrial fibrillation ([place])   . Benign neoplasm of brain ([place])   . CHF (congestive heart failure) ([place])   . Difficulty hearing   . Diverticulitis   . DVT (deep venous thrombosis) ([place])   . Esophageal reflux   . Flu 11/04/2014  . GI bleed   . Hyperlipidemia   . Hypothyroid   . Hypothyroidism   . Low blood pressure    when hbg is bekow 9  . Myelodysplastic syndrome with 5q deletion ([place])   . Osteoarthritis 10/09/2010  . Osteoporosis 10/09/2010  . Pacemaker   . Peptic ulcer    with GERD  . Peptic ulcer   . Pulmonary embolism ([place])   . Pulmonary fibrosis ([place])     Past Surgical History:  Procedure Laterality Date  . ABLATION    . APPENDECTOMY    . BACK SURGERY    . BILATERAL CARPAL TUNNEL RELEASE    . CATARACT EXTRACTION, BILATERAL    . CHOLECYSTECTOMY    . ESOPHAGOGASTRODUODENOSCOPY (EGD) WITH PROPOFOL N/A 12/22/2016   Procedure: ESOPHAGOGASTRODUODENOSCOPY (EGD) WITH PROPOFOL;  Surgeon: Mauri Pole, MD;  Location: [facility];  Service: Endoscopy;  Laterality: N/A;  . HERNIA REPAIR    . PACEMAKER INSERTION    .  TONSILLECTOMY    . TUBAL LIGATION Bilateral      reports that  has never smoked. she has never used smokeless tobacco. She reports that she does not drink alcohol or use drugs. Social History   Socioeconomic History  . Marital status: Widowed    Spouse name: Not on file  . Number of children: 5  . Years of education: Not on file  . Highest education level: Not on file  Social Needs  . Financial resource strain: Not on file  . Food insecurity - worry: Not on file  . Food insecurity - inability: Not on file  . Transportation needs - medical: Not on file  . Transportation needs - non-medical: Not on file  Occupational History  . Not on file   Tobacco Use  . Smoking status: Never Smoker  . Smokeless tobacco: Never Used  Substance and Sexual Activity  . Alcohol use: No  . Drug use: No  . Sexual activity: Not Currently  Other Topics Concern  . Not on file  Social History Narrative   Social History      Diet? I eat what I want      Do you drink/eat things with caffeine? yes      Marital status?            widowed                        What year were you married? 1950      Do you live in a house, apartment, assisted living, condo, trailer, etc.? With family      Is it one or more stories? 2 stories but stay on 1st floor       How many persons live in your home?       Do you have any pets in your home? (please list) usually 0 (but currently 1 cat)      Highest level of education completed?       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         some                             Type & how often? When I think about it      Advanced Directives      Do you have a living will? no      Do you have a DNR form?     no                             If not, do you want to discuss one? no      Do you have signed POA/HPOA for forms?  no      Functional Status      Do you have difficulty bathing or dressing yourself?      Do you have difficulty preparing food or eating?       Do you have difficulty managing your medications?      Do you have difficulty managing your finances?      Do you have difficulty affording your medications?    Family History  Problem Relation Age of Onset  . Heart attack Mother   . Cancer Father        throat  . Arthritis Sister   .  Lung disease Brother   . Heart disease Brother   . Cancer Brother   . Gout Brother   . Arthritis Brother   . Stroke Son   . Diabetes Son     Allergies  Allergen Reactions  . Demerol [Meperidine] Other (See Comments)    INTENSIFIED FEELINGS  . Hydroxyzine Other (See Comments)    INTENSIFIED FEELINGS  .  Talwin [Pentazocine] Other (See Comments)    INTENSIFIED FEELINGS    Outpatient Encounter Medications as of 11/28/2017  Medication Sig  . Calcium-Vitamin D-Vitamin K (VIACTIV PO) Take by mouth. Plus D and K 2 by mouth at lunch  . Cholecalciferol (VITAMIN D3) 5000 units CAPS Take 1 capsule by mouth daily.  . DULoxetine (CYMBALTA) 30 MG capsule Take 1 capsule (30 mg total) by mouth 2 (two) times daily.  . furosemide (LASIX) 20 MG tablet Take 20 mg by mouth as needed.   Marland Kitchen HYDROcodone-acetaminophen (NORCO/VICODIN) 5-325 MG tablet Take 1 tablet by mouth at bedtime. Only takes as needed   . levothyroxine (SYNTHROID, LEVOTHROID) 100 MCG tablet Take 100 mcg by mouth daily. In the morning  . Melatonin 10 MG CAPS Take 1 tablet by mouth daily.  . montelukast (SINGULAIR) 10 MG tablet Take 10 mg by mouth daily. In the morning  . Multiple Vitamins-Minerals (CENTRUM SILVER ULTRA MENS PO) Take by mouth daily.  Marland Kitchen omeprazole (PRILOSEC) 20 MG capsule Take 1 capsule (20 mg total) 2 (two) times daily before a meal by mouth.  . OXYGEN Inhale 3-4 L into the lungs daily.  . potassium chloride SA (K-DUR,KLOR-CON) 20 MEQ tablet Take 1 tablet (20 mEq total) by mouth as needed. When taking furosemide  . sucralfate (CARAFATE) 1 GM/10ML suspension Take 10 mLs (1 g total) by mouth 2 (two) times daily.   No facility-administered encounter medications on file as of 11/28/2017.     Review of Systems:  Review of Systems  HENT: Positive for hearing loss.   Musculoskeletal: Positive for arthralgias and back pain.  All other systems reviewed and are negative.   Health Maintenance  Topic Date Due  . TETANUS/TDAP  01/23/1949  . PNA vac Low Risk Adult (1 of 2 - PCV13) 01/22/1997  . INFLUENZA VACCINE  Completed  . DEXA SCAN  Completed    Physical Exam: Vitals:   11/28/17 1127  BP: 132/70  Temp: 97.6 F (36.4 C)  TempSrc: Oral  Weight: 142 lb (64.4 kg)   Body mass index is 25.15 kg/m. Physical Exam    Constitutional: She is oriented to person, place, and time. She appears well-developed and well-nourished.  HOH  HENT:  Mouth/Throat: Oropharynx is clear and moist. No oropharyngeal exudate.  MMM; no oral thrush  Eyes: Pupils are equal, round, and reactive to light. No scleral icterus.  Neck: Neck supple. Carotid bruit is not present. No tracheal deviation present. No thyromegaly present.  Cardiovascular: Normal rate, regular rhythm and intact distal pulses. Exam reveals no gallop and no friction rub.  Murmur (1/6 SEM) heard. No LE edema b/l. no calf TTP.   Pulmonary/Chest: Effort normal and breath sounds normal. No stridor. No respiratory distress. She has no wheezes. She has no rales.  Reduced BS at base but no w/r/r  Abdominal: Soft. Normal appearance and bowel sounds are normal. She exhibits no distension and no mass. There is no hepatomegaly. There is no tenderness. There is no rigidity, no rebound and no guarding. No hernia.  Musculoskeletal: She exhibits edema.  Lymphadenopathy:    She  has no cervical adenopathy.  Neurological: She is alert and oriented to person, place, and time.  Skin: Skin is warm and dry. No rash noted.  Psychiatric: She has a normal mood and affect. Her behavior is normal. Judgment and thought content normal.    Labs reviewed: Basic Metabolic Panel: Recent Labs    12/23/16 0527  06/06/17 07/16/17 1012  07/30/17 1521  09/16/17 1030 10/15/17 1301 11/26/17 1333  NA 142   < > 140 140   < >  --    < > 138 143 142  K 3.9   < > 4.8 4.8   < >  --    < > 4.4 4.4 4.8*  CL 109  --   --  104  --   --   --   --   --  106  CO2 28  --   --  29   < >  --    < > 27 27 28   GLUCOSE 94  --   --  101*   < >  --    < > 75 120 111  BUN 7   < > 25* 21   < >  --    < > 22.4 15.6 27*  CREATININE 0.78   < > 1.0 0.99*   < >  --    < > 1.0 0.9 1.07  CALCIUM 8.1*  --   --  9.6   < >  --    < > 8.9 9.3 9.1  TSH  --   --  2.36 0.85  --  0.458  --   --   --   --    < > = values  in this interval not displayed.   Liver Function Tests: Recent Labs    09/16/17 1030 10/15/17 1301 11/26/17 1333  AST 16 16 15   ALT 19 19 18   ALKPHOS 65 67 62  BILITOT 0.62 0.50 0.6  PROT 7.0 7.0 6.9  ALBUMIN 3.6 3.7 3.7   No results for input(s): LIPASE, AMYLASE in the last 8760 hours. No results for input(s): AMMONIA in the last 8760 hours. CBC: Recent Labs    10/01/17 1313 10/15/17 1301 10/29/17 1245 11/26/17 1333  WBC 4.7 3.7* 4.3 3.7*  NEUTROABS 2.4 2.4 2.5 2.4  HGB 9.3* 8.5* 8.7*  --   HCT 28.3* 26.4* 26.3* 25.5*  MCV 108.0* 113.3* 110.8* 112.3*  PLT 314 303 Large & giant platelets 253 243   Lipid Panel: Recent Labs    02/11/17  CHOL 174  HDL 37  LDLCALC 106  TRIG 194*   No results found for: HGBA1C  Procedures since last visit: No results found.  Assessment/Plan   ICD-10-CM   1. Hypothyroidism (acquired) E03.9 TSH    T4, Free  2. Chronic pain syndrome G89.4 HYDROcodone-acetaminophen (NORCO/VICODIN) 5-325 MG tablet  3. Insomnia due to medical condition G47.01   4. Bilateral hearing loss, unspecified hearing loss type H91.93   5. MDS (myelodysplastic syndrome) with 5q deletion ([place]) D46.C   6. Anemia due to bone marrow failure, unspecified bone marrow failure type ([place]) D61.9    Follow up with hearing specialist as scheduled  Follow up with specialists as scheduled  Will call with lab results  Continue current medications as ordered  Follow up in 3 mos for thyroid, chronic pain, insomnia, anemia   [facility] [address] [place], [place] [address] [phone] Cell (Monday-Friday 8 AM - 5 PM) [phone] After 5 PM and follow prompts  

## 2017-12-01 ENCOUNTER — Telehealth: Payer: Self-pay | Admitting: Hematology and Oncology

## 2017-12-01 NOTE — Telephone Encounter (Signed)
Returned call to patient son re moving labs from Thursdays to Fridays same day as PRBC's.   Son informed that we could schedule appointments for the same day, however wait time between lab/blood would be about 4 hours (antibodies in blood). Per son this is ok due to he cannot bring patient for two days straight. Son aware labs will be moved to Fridays at 9 am, however blood would remain scheduled at 1 pm. Son will get updated schedule at 2/1 visit.

## 2017-12-02 ENCOUNTER — Other Ambulatory Visit: Payer: Self-pay | Admitting: Internal Medicine

## 2017-12-04 ENCOUNTER — Other Ambulatory Visit: Payer: Medicare Other

## 2017-12-05 ENCOUNTER — Inpatient Hospital Stay: Payer: Medicare Other

## 2017-12-05 ENCOUNTER — Other Ambulatory Visit: Payer: Self-pay | Admitting: *Deleted

## 2017-12-05 ENCOUNTER — Other Ambulatory Visit: Payer: Self-pay

## 2017-12-05 ENCOUNTER — Inpatient Hospital Stay: Payer: Medicare Other | Attending: Hematology and Oncology

## 2017-12-05 ENCOUNTER — Ambulatory Visit (HOSPITAL_COMMUNITY)
Admission: RE | Admit: 2017-12-05 | Discharge: 2017-12-05 | Disposition: A | Discharge status: Home / Self Care | Payer: Medicare Other | Source: Ambulatory Visit | Attending: Hematology and Oncology | Admitting: Hematology and Oncology

## 2017-12-05 DIAGNOSIS — D46C Myelodysplastic syndrome with isolated del(5q) chromosomal abnormality: Secondary | ICD-10-CM

## 2017-12-05 DIAGNOSIS — I10 Essential (primary) hypertension: Secondary | ICD-10-CM | POA: Diagnosis not present

## 2017-12-05 DIAGNOSIS — D619 Aplastic anemia, unspecified: Secondary | ICD-10-CM

## 2017-12-05 DIAGNOSIS — K219 Gastro-esophageal reflux disease without esophagitis: Secondary | ICD-10-CM | POA: Insufficient documentation

## 2017-12-05 DIAGNOSIS — D62 Acute posthemorrhagic anemia: Secondary | ICD-10-CM

## 2017-12-05 DIAGNOSIS — Z86718 Personal history of other venous thrombosis and embolism: Secondary | ICD-10-CM | POA: Insufficient documentation

## 2017-12-05 DIAGNOSIS — M810 Age-related osteoporosis without current pathological fracture: Secondary | ICD-10-CM | POA: Diagnosis not present

## 2017-12-05 DIAGNOSIS — E785 Hyperlipidemia, unspecified: Secondary | ICD-10-CM | POA: Diagnosis not present

## 2017-12-05 DIAGNOSIS — M1990 Unspecified osteoarthritis, unspecified site: Secondary | ICD-10-CM | POA: Insufficient documentation

## 2017-12-05 DIAGNOSIS — K921 Melena: Secondary | ICD-10-CM

## 2017-12-05 DIAGNOSIS — F419 Anxiety disorder, unspecified: Secondary | ICD-10-CM | POA: Diagnosis not present

## 2017-12-05 DIAGNOSIS — I509 Heart failure, unspecified: Secondary | ICD-10-CM | POA: Insufficient documentation

## 2017-12-05 DIAGNOSIS — Z79899 Other long term (current) drug therapy: Secondary | ICD-10-CM | POA: Diagnosis not present

## 2017-12-05 LAB — SAMPLE TO BLOOD BANK

## 2017-12-05 LAB — CBC WITH DIFFERENTIAL (CANCER CENTER ONLY)
Basophils Absolute: 0.1 10*3/uL (ref 0.0–0.1)
Basophils Relative: 2 %
EOS ABS: 0 10*3/uL (ref 0.0–0.5)
EOS PCT: 0 %
HCT: 27.3 % — ABNORMAL LOW (ref 34.8–46.6)
Hemoglobin: 9 g/dL — ABNORMAL LOW (ref 11.6–15.9)
LYMPHS ABS: 1 10*3/uL (ref 0.9–3.3)
LYMPHS PCT: 27 %
MCH: 35.4 pg — AB (ref 25.1–34.0)
MCHC: 33.1 g/dL (ref 31.5–36.0)
MCV: 107 fL — AB (ref 79.5–101.0)
MONOS PCT: 11 %
Monocytes Absolute: 0.4 10*3/uL (ref 0.1–0.9)
Neutro Abs: 2.3 10*3/uL (ref 1.5–6.5)
Neutrophils Relative %: 60 %
PLATELETS: 248 10*3/uL (ref 145–400)
RBC: 2.55 MIL/uL — AB (ref 3.70–5.45)
RDW: 28.1 % — ABNORMAL HIGH (ref 11.2–14.5)
WBC: 3.9 10*3/uL (ref 3.9–10.3)

## 2017-12-05 LAB — PREPARE RBC (CROSSMATCH)

## 2017-12-05 MED ORDER — SODIUM CHLORIDE 0.9% FLUSH
10.0000 mL | INTRAVENOUS | Status: AC | PRN
  Filled 2017-12-05: qty 10

## 2017-12-05 MED ORDER — SODIUM CHLORIDE 0.9 % IV SOLN: 250.0000 mL | Freq: Once | INTRAVENOUS | Status: AC

## 2017-12-05 MED ORDER — SODIUM CHLORIDE 0.9 % IV SOLN
250.0000 mL | Freq: Once | INTRAVENOUS | Status: AC
  Administered 2017-12-05: 250 mL via INTRAVENOUS

## 2017-12-05 MED ORDER — HEPARIN SOD (PORK) LOCK FLUSH 100 UNIT/ML IV SOLN
500.0000 [IU] | Freq: Every day | INTRAVENOUS | Status: AC | PRN
  Filled 2017-12-05: qty 5

## 2017-12-05 MED ORDER — HEPARIN SOD (PORK) LOCK FLUSH 100 UNIT/ML IV SOLN
250.0000 [IU] | INTRAVENOUS | Status: AC | PRN
  Filled 2017-12-05: qty 5

## 2017-12-05 NOTE — Patient Instructions (Signed)

## 2017-12-06 LAB — BPAM RBC
Blood Product Expiration Date: 201902222359
ISSUE DATE / TIME: 201902011336
Unit Type and Rh: 7300

## 2017-12-06 LAB — TYPE AND SCREEN
ABO/RH(D): B POS
ANTIBODY SCREEN: POSITIVE
Donor AG Type: NEGATIVE
Unit division: 0

## 2017-12-07 ENCOUNTER — Other Ambulatory Visit: Payer: Self-pay | Admitting: Internal Medicine

## 2017-12-11 ENCOUNTER — Ambulatory Visit: Payer: Medicare Other | Attending: Hematology and Oncology | Admitting: Audiology

## 2017-12-11 DIAGNOSIS — H9193 Unspecified hearing loss, bilateral: Secondary | ICD-10-CM | POA: Diagnosis not present

## 2017-12-11 DIAGNOSIS — H903 Sensorineural hearing loss, bilateral: Secondary | ICD-10-CM | POA: Diagnosis not present

## 2017-12-11 DIAGNOSIS — H93299 Other abnormal auditory perceptions, unspecified ear: Secondary | ICD-10-CM | POA: Insufficient documentation

## 2017-12-11 DIAGNOSIS — Z9289 Personal history of other medical treatment: Secondary | ICD-10-CM | POA: Diagnosis not present

## 2017-12-11 NOTE — Patient Instructions (Signed)
Kimberly Horn has a severe to profound sensorineural hearing loss bilaterally.  Even at extremely loud levels, equivalent to shouting at 3-4 inches from her ear, she has extremely poor word recognition on 36% on the right and 32% on the left side.  Kimberly Horn is unable to hear normal communication levels and is severely hearing handicapped.   Although she has hearing aids, it is unclear how old they are or whether they are functioning.  Referral to [facility] for a hearing aid evaluation, to determine whether the hearing aids that she has are usable and to determine whether her insurance or a state funded hearing aid would be available to her is strongly recommended.    Strategies that help improve hearing include: A) Face the speaker directly. Optimal is having the speakers face well - lit.  Unless amplified, being within 6-12 inches with a loud clear voice will enhance word recognition. B) Avoid having the speaker back-lit as this will minimize the ability to use cues from lip-reading, facial expression and gestures. C)  Word recognition is poorer in background noise. For optimal word recognition, turn off the TV, radio or noisy fan when engaging in conversation. In a restaurant, try to sit away from noise sources and close to the primary speaker.  D)  Ask for topic clarification from time to time in order to remain in the conversation.  Most people don't mind repeating or clarifying a point when asked.  If needed, explain the difficulty hearing in background noise or hearing loss.   Deborah L. Heide Spark, Au.D., CCC-A Doctor of Audiology  12/11/2017

## 2017-12-11 NOTE — Procedures (Signed)
Outpatient Audiology and [facility]  [place], [place] [address]  [phone]   Audiological Evaluation  Patient Name: Arrielle Mcginn    Status: Outpatient   DOB: 1930-09-14    Diagnosis:  Bilateral Hearing Loss MRN: [id number] Date:  12/11/2017     Referent: Gildardo Cranker, DO  History: Shoshannah Faubert was seen for an audiological evaluation.  Needs pre "iron chelation therapy medication" in case this medication is started because physician said that it was ototoxic. Shenia has difficulty hearing and difficulty understanding others talking. Accompanied by: Daughter in Law Pain: None History of hearing problems: Y - "started wearing hearing aids in my mid-thirties".  History of ear infections:  Y / N History of ear surgery for tympanic membrane perforation in 60's". History of dizziness/vertigo:   Y - especially if turn head quickly. History of balance issues:  Y - uses cane when I walks outside.  Tinnitus: Y - hears "strange sounds that sound like "baby monitors" - but only happens when the hearing aid was in."  History of hypertension: Y - in past, but now has "low blood pressure". History of diabetes: N Family history of hearing loss: Y - on Dad's side and three brothers have hearing loss. Other concerns: Heart problem    Evaluation: Conventional pure tone audiometry from 250Hz  - 8000Hz  with using insert earphones.  Hearing Thresholds show sensorineural loss bilaterally on 80 to 95 dBHL on the right and 85-100dBHL from 500Hz  to 8000Hz .  At 125Hz  hearing threshlds are 50 dBHL on the right and 80 dBHL on the left. Reliability is good  Word recognition scores at Most Comfortable Listening Levels in quiet using monitored live voice and NU-6 words lists was 36% at 95 dBHL on the right  And 32% at 100dBL in the left. Uncomfortable loudness levels were "a little loud" at 105dB on the right side and >110dBHL in the left ear.  The overall test reliability was judged to be  good.  Tympanometry (middle ear function) with absent acoustic reflexes bilaterally.  Right ear: Normal middle ear volume, pressure and compliance (Type A)   Left ear: Slightly shallow tympanic membrane compliance with normal middle ear volume and pressure. (Type As).   CONCLUSION:   Ceonna Frazzini has a severe to profound sensorineural hearing loss bilaterally.  Even at extremely loud levels, equivalent to shouting at 3-4 inches from her ear, she has extremely poor word recognition on 36% on the right and 32% on the left side.  Marlei is unable to hear normal communication levels and is severely hearing handicapped.   Although she has hearing aids, it is unclear how old they are or whether they are functioning.  Referral to [facility] for a hearing aid evaluation, to determine whether the hearing aids that she has are usable and to determine whether her insurance or a state funded hearing aid would be available to her is strongly recommended.  The test results were discussed and Jenascia Bumpass counseled.   RECOMMENDATIONS: 1.   Referral to [facility] Balance Assessment for unsteadiness, cane use at home and to rule out BPPV because of her history of brief vertigo onset with rapid headturn.  2.   The family to contact [facility] for further hearing aid help. Referral to [facility] for a hearing aid evaluation, to determine whether the hearing aids that she has are usable and to determine whether her insurance or a state funded hearing aid would be available to her  is strongly recommended.    3.  Strategies that help improve hearing include: A) Face the speaker directly. Optimal is having the speakers face well - lit.  Unless amplified, being within 3-6 feet of the speaker will enhance word recognition. B) Avoid having the speaker back-lit as this will minimize the ability to use cues from lip-reading, facial expression and gestures. C)  Word recognition is poorer in background noise. For  optimal word recognition, turn off the TV, radio or noisy fan when engaging in conversation. In a restaurant, try to sit away from noise sources and close to the primary speaker.        D)  Ask for topic clarification from time to time in order to remain in the conversation.  Most people don't mind repeating or clarifying a point when asked.  If needed, explain the difficulty hearing in background        noise or hearing loss.   Jairen Goldfarb L. Heide Spark, Au.D., CCC-A Doctor of Audiology 12/11/2017   cc: Gildardo Cranker, DO

## 2017-12-18 ENCOUNTER — Other Ambulatory Visit: Payer: Medicare Other

## 2017-12-18 NOTE — Progress Notes (Signed)
[facility] Follow-up Visit:  Assessment: MDS (myelodysplastic syndrome) with 5q deletion ([place]) 84 y.o. female with del 5q MDS diagnosis, currently managed supportive care risk over initiation of lenalidomide therapy based on the previous history of DVT, peptic ulcer disease, and GI bleeding in addition to multiple other comorbidities including pulmonary fibrosis, congestive heart failure, and atrial fibrillation. Patient underwent appropriately usual evaluation locally confirming presence of myelodysplastic syndrome. Our additional evaluation demonstrated reasonably high erythropoietin level making supplementation unlikely to be helpful in relieving anemia.  At the present time, patient is requiring infrequent transfusions.  Current average is approximately 2 units/month.  Patient tolerated transfusions well, but does have positive anti-K antibodies making crossmatching slightly more difficult.  Previously, we have discussed potential initiation of iron chelation with the patient due to repeat transfusions leading to the iron overload.  Ultimately, patient expresses no desire for initiation of such therapy due to potential risks.  She is accepting of the potential complications of iron overload such as arthritis, diabetes, liver injury, and potential development of heart failure.   Plan: --Continue labs QoWk based on transfusion requirements over the past month.  No further testing for iron burden as the patient is not likely to accept iron chelation therapy in the future. --Continue transfusion support: 1U pRBCs for Hgb <=9.0g/dL, all products leukoreduced and irradiated; --Return to clinic in 3 months with lab work and possible transfusion support   Voice recognition software was used and creation of this note. Despite my best effort at editing the text, some misspelling/errors may have occurred.  Orders Placed This Encounter  Procedures  . Hemoglobin and Hematocrit (Cancer  Center Only)    Standing Status:   Future    Number of Occurrences:   1    Standing Expiration Date:   11/28/2018    All questions were answered.  . The patient knows to call the clinic with any problems, questions or concerns.  This note was electronically signed.    History of Presenting Illness Kimberly Horn is an 84 y.o. female followed in the [facility] for Myelodysplastic syndrome with deletion 5q. The patient was previously managed by Dr. Radene Knee in [place]. Lenalidomide therapy was considered, but wasn't started due to history of peptic ulcer disease with gastrointestinal bleeding causing significant risk of anticoagulation and previous history of DVT. So far, patient was managed symptomatically with transfusions. Patient usually develops symptoms of lightheadedness, dizziness, orthostatic lightheadedness at hemoglobin 9.0g/dL, and symptoms resolved while hemoglobin is above that target.  Patient returns to the clinic for continued hematological monitoring. Patient denies any complaints since the last visit to the clinic. At the present time, patient denies active chest pain, her shortness of breath is at baseline for pulmonary fibrosis diagnosis, as well as congestive heart failure. Patient denies productive cough, fevers, chills, night sweats. Abdominal pain, nausea, vomiting, diarrhea, or constipation. No dysuria or hematuria. No current skin rash.  Patient has not undergone ophthalmology or audiology examination that was discussed previously for possible initiation of iron chelation therapy.  At this point, patient does not express any further interest in trying to make initiate iron chelation due to potential for side effects.  Oncological/hematological History: --Labs, 12/21/16:  Vit B12 306, Folate    28.5, Fe 209, FeSat 79%, TIBC 263, Ferritin 220; --Labs, 06/04/17:           Vit B12 588, Folate >24.0, Fe 160, FeSat 48%, TIBC  333, Ferritin 448; LDH 379; TSH 2.36, Epo >3750 --Labs, 06/06/17:                  Hgb 6.1,                              Vit B12 588  --BM Bx, 06/07/17: Hypercellular bone marrow with 40% cellularity with megakaryocytic dysplasia and increased to renewing sideroblasts consistent with myelodysplastic syndrome. 3% atypical blasts present; FlowCyto --no evidence of abnormal myeloid maturation or increased blast population.  No evidence of a lymphoproliferative disorder; CytoGen -- 46,XX,del(5)(q15q33)/46,XX; no other cytogenetic abnormalities identified; FISH -- positive for del5q & negative for any other significant abnormalities --Labs, 07/23/17: WBC 4.0, Hgb 9.5, Plt 323; --Labs, 07/30/17:                                                   Fe 221, FeSat 82%, TIBC 269, Ferritin 1222; Epo 457; 1U pRBC transfused --Labs, 09/03/17: WBC 3.5, Hgb 9.8, Plt 266;      Fe 256, FeSat 99%, TIBC 259, Ferritin 1629;  --Clinic visit, 09/04/17: in the interim required 2U pRBC on 08/14/17 (Positive for anti-K Abs) --Labs, 11/26/17: WBC 3.7, Hgb 8.1, Plt 243;      Fe 242, FeSat 96%, TIBC 253, Ferritin 1230; --Clinic visit, 11/28/17: Interval received 4U pRBC since 09/04/17  Medical History: Past Medical History:  Diagnosis Date  . Anxiety   . Atrial fibrillation ([place])   . Benign neoplasm of brain ([place])   . CHF (congestive heart failure) ([place])   . Difficulty hearing   . Diverticulitis   . DVT (deep venous thrombosis) ([place])   . Esophageal reflux   . Flu 11/04/2014  . GI bleed   . Hyperlipidemia   . Hypothyroid   . Hypothyroidism   . Low blood pressure    when hbg is bekow 9  . Myelodysplastic syndrome with 5q deletion ([place])   . Osteoarthritis 10/09/2010  . Osteoporosis 10/09/2010  . Pacemaker   . Peptic ulcer    with GERD  . Peptic ulcer   . Pulmonary embolism ([place])   . Pulmonary fibrosis [facility])     Surgical History: Past Surgical History:  Procedure Laterality Date  . ABLATION    . APPENDECTOMY     . BACK SURGERY    . BILATERAL CARPAL TUNNEL RELEASE    . CATARACT EXTRACTION, BILATERAL    . CHOLECYSTECTOMY    . ESOPHAGOGASTRODUODENOSCOPY (EGD) WITH PROPOFOL N/A 12/22/2016   Procedure: ESOPHAGOGASTRODUODENOSCOPY (EGD) WITH PROPOFOL;  Surgeon: Mauri Pole, MD;  Location: [facility];  Service: Endoscopy;  Laterality: N/A;  . HERNIA REPAIR    . PACEMAKER INSERTION    . TONSILLECTOMY    . TUBAL LIGATION Bilateral     Family History: Family History  Problem Relation Age of Onset  . Heart attack Mother   . Cancer Father        throat  . Arthritis Sister   . Lung disease Brother   . Heart disease Brother   . Cancer Brother   .  Gout Brother   . Arthritis Brother   . Stroke Son   . Diabetes Son     Social History: Social History   Socioeconomic History  . Marital status: Widowed    Spouse name: Not on file  . Number of children: 5  . Years of education: Not on file  . Highest education level: Not on file  Social Needs  . Financial resource strain: Not on file  . Food insecurity - worry: Not on file  . Food insecurity - inability: Not on file  . Transportation needs - medical: Not on file  . Transportation needs - non-medical: Not on file  Occupational History  . Not on file  Tobacco Use  . Smoking status: Never Smoker  . Smokeless tobacco: Never Used  Substance and Sexual Activity  . Alcohol use: No  . Drug use: No  . Sexual activity: No  Other Topics Concern  . Not on file  Social History Narrative   Social History      Diet? I eat what I want      Do you drink/eat things with caffeine? yes      Marital status?            widowed                        What year were you married? 1950      Do you live in a house, apartment, assisted living, condo, trailer, etc.? With family      Is it one or more stories? 2 stories but stay on 1st floor       How many persons live in your home?       Do you have any pets in your home? (please list) usually  0 (but currently 1 cat)      Highest level of education completed?       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         some                             Type & how often? When I think about it      Advanced Directives      Do you have a living will? no      Do you have a DNR form?     no                             If not, do you want to discuss one? no      Do you have signed POA/HPOA for forms?  no      Functional Status      Do you have difficulty bathing or dressing yourself?      Do you have difficulty preparing food or eating?       Do you have difficulty managing your medications?      Do you have difficulty managing your finances?      Do you have difficulty affording your medications?    Allergies: Allergies  Allergen Reactions  . Demerol [Meperidine] Other (See Comments)    INTENSIFIED FEELINGS  . Hydroxyzine Other (See Comments)    INTENSIFIED FEELINGS  . Talwin [Pentazocine] Other (See Comments)    INTENSIFIED FEELINGS    Medications:  Current Outpatient  Medications  Medication Sig Dispense Refill  . Calcium-Vitamin D-Vitamin K (VIACTIV PO) Take by mouth. Plus D and K 2 by mouth at lunch    . Cholecalciferol (VITAMIN D3) 5000 units CAPS Take 1 capsule by mouth daily.    . DULoxetine (CYMBALTA) 30 MG capsule Take 1 capsule (30 mg total) by mouth 2 (two) times daily. 60 capsule 5  . furosemide (LASIX) 20 MG tablet Take 20 mg by mouth as needed.     Marland Kitchen HYDROcodone-acetaminophen (NORCO/VICODIN) 5-325 MG tablet Take 1 tablet by mouth at bedtime. 30 tablet 0  . levothyroxine (SYNTHROID, LEVOTHROID) 100 MCG tablet Take 100 mcg by mouth daily. In the morning    . Melatonin 10 MG CAPS Take 1 tablet by mouth daily.    . montelukast (SINGULAIR) 10 MG tablet Take 10 mg by mouth daily. In the morning    . Multiple Vitamins-Minerals (CENTRUM SILVER ULTRA MENS PO) Take by mouth daily.    Marland Kitchen omeprazole (PRILOSEC) 20 MG  capsule TAKE 1 CAPSULE(20 MG) BY MOUTH TWICE DAILY BEFORE A MEAL 60 capsule 3  . OXYGEN Inhale 3-4 L into the lungs daily.    . potassium chloride SA (K-DUR,KLOR-CON) 20 MEQ tablet TAKE 1 TABLET BY MOUTH DAILY AS NEEDED WHEN TAKING FUROSEMIDE 30 tablet 3  . sucralfate (CARAFATE) 1 GM/10ML suspension Take 10 mLs (1 g total) by mouth 2 (two) times daily. 420 mL 5   No current facility-administered medications for this visit.    Facility-Administered Medications Ordered in Other Visits  Medication Dose Route Frequency Provider Last Rate Last Dose  . 0.9 %  sodium chloride infusion  250 mL Intravenous Once ,  G, MD      . heparin lock flush 100 unit/mL  250 Units Intracatheter PRN , Marinell Blight, MD      . heparin lock flush 100 unit/mL  500 Units Intracatheter Daily PRN , Marinell Blight, MD      . sodium chloride flush (NS) 0.9 % injection 10 mL  10 mL Intracatheter PRN ,  G, MD      . sodium chloride flush (NS) 0.9 % injection 10 mL  10 mL Intracatheter PRN Lebron Conners, Marinell Blight, MD        Review of Systems: Review of Systems  Musculoskeletal: Positive for neck pain.  Neurological: Positive for light-headedness.  All other systems reviewed and are negative.    PHYSICAL EXAMINATION Blood pressure (!) 148/60, pulse 87, temperature (!) 97.5 F (36.4 C), temperature source Oral, resp. rate 16, height _0  (1.6 m), weight 140 lb 4.8 oz (63.6 kg).  ECOG PERFORMANCE STATUS: 2 - Symptomatic, <50% confined to bed  Physical Exam  Constitutional: She is oriented to person, place, and time and well-developed, well-nourished, and in no distress. No distress.  HENT:  Head: Normocephalic and atraumatic.  Mouth/Throat: Oropharynx is clear and moist. No oropharyngeal exudate.  Eyes: Conjunctivae and EOM are normal. Pupils are equal, round, and reactive to light. No scleral icterus.  Neck: Normal range of motion. Neck supple. No thyromegaly present.  Cardiovascular: Normal  rate, regular rhythm and normal heart sounds.  No murmur heard. Pulmonary/Chest: Effort normal and breath sounds normal. No respiratory distress. She has no wheezes. She has no rales.  Abdominal: Soft. Bowel sounds are normal. She exhibits no distension and no mass. There is no tenderness. There is no rebound and no guarding.  Musculoskeletal: Normal range of motion. She exhibits no edema.  Lymphadenopathy:    She has no  cervical adenopathy.  Neurological: She is alert and oriented to person, place, and time. She has normal reflexes. No cranial nerve deficit.  Skin: Skin is warm and dry. No rash noted. She is not diaphoretic. No erythema.     LABORATORY DATA: I have personally reviewed the data as listed: Appointment on 11/28/2017  Component Date Value Ref Range Status  . Hemoglobin 11/28/2017 9.7* 11.6 - 15.9 g/dL Final  . HCT 11/28/2017 29.2* 34.8 - 46.6 % Final   Performed at [facility], [place] [address] [place], [place] [address]  Office Visit on 11/28/2017  Component Date Value Ref Range Status  . TSH 11/28/2017 0.81  0.40 - 4.50 mIU/L Final  . Free T4 11/28/2017 1.4  0.8 - 1.8 ng/dL Final  Orders Only on 11/26/2017  Component Date Value Ref Range Status  . Order Confirmation 11/26/2017    Final                   Value:ORDER PROCESSED BY BLOOD BANK Performed at [facility], [place] [address], [place], [place] [address]   Orders Only on 11/26/2017  Component Date Value Ref Range Status  . ABO/RH(D) 11/26/2017 B POS   Final  . Antibody Screen 11/26/2017 POS   Final  . Sample Expiration 11/26/2017 11/29/2017   Final  . Antibody Identification 60/60/0459 ANTI K   Final  . Unit Number 11/26/2017 [id number]   Final  . Blood Component Type 11/26/2017 RBC, LR IRR   Final  . Unit division 11/26/2017 00   Final  . Status of Unit 11/26/2017 ISSUED,FINAL   Final  . Donor AG Type 11/26/2017 NEGATIVE FOR KELL ANTIGEN   Final  . Transfusion  Status 11/26/2017 OK TO TRANSFUSE   Final  . Crossmatch Result 11/26/2017 COMPATIBLE   Final  . ISSUE DATE / TIME 11/26/2017 [id number]   Final  . Blood Product Unit Number 11/26/2017 [id number]   Final  . PRODUCT CODE 11/26/2017 [id number]   Final  . Unit Type and Rh 11/26/2017 7300   Final  . Blood Product Expiration Date 11/26/2017 [id number]   Final  Appointment on 11/26/2017  Component Date Value Ref Range Status  . Iron 11/26/2017 242* 41 - 142 ug/dL Final  . TIBC 11/26/2017 253  236 - 444 ug/dL Final  . Saturation Ratios 11/26/2017 96* 21 - 57 % Final  . UIBC 11/26/2017 11  ug/dL Final   Performed at [facility], [place] [address], [place], [place] [address]  . Ferritin 11/26/2017 1,230* 9 - 269 ng/mL Final   Performed at [facility], [place] [address], [place], [place] [address]  . Sodium 11/26/2017 142  136 - 145 mmol/L Final  . Potassium 11/26/2017 4.8* 3.3 - 4.7 mmol/L Final  . Chloride 11/26/2017 106  98 - 109 mmol/L Final  . CO2 11/26/2017 28  22 - 29 mmol/L Final  . Glucose, Bld 11/26/2017 111  70 - 140 mg/dL Final  . BUN 11/26/2017 27* 7 - 26 mg/dL Final  . Creatinine, Ser 11/26/2017 1.07  0.60 - 1.10 mg/dL Final  . Calcium 11/26/2017 9.1  8.4 - 10.4 mg/dL Final  . Total Protein 11/26/2017 6.9  6.4 - 8.3 g/dL Final  . Albumin 11/26/2017 3.7  3.5 - 5.0 g/dL Final  . AST 11/26/2017 15  5 - 34 U/L Final  . ALT 11/26/2017 18  0 - 55 U/L Final  . Alkaline Phosphatase 11/26/2017 62  40 - 150 U/L Final  . Total Bilirubin 11/26/2017 0.6  0.2 - 1.2 mg/dL Final  . GFR calc non Af Amer 11/26/2017 46* >60 mL/min Final  . GFR calc Af Amer 11/26/2017 53* >60 mL/min Final   Comment: (NOTE) The eGFR has been calculated using the CKD EPI equation. This calculation has not been validated in all clinical situations. eGFR's persistently <60 mL/min signify possible Chronic Kidney Disease.   Georgiann Hahn gap 11/26/2017 8  3 - 11 Final    Performed at [facility], [place] [address], [place], [place] [address]  . WBC Count 11/26/2017 3.7* 3.9 - 10.3 K/uL Final  . RBC 11/26/2017 2.27* 3.70 - 5.45 MIL/uL Final  . Hemoglobin 11/26/2017 8.1* 11.6 - 15.9 g/dL Final  . HCT 11/26/2017 25.5* 34.8 - 46.6 % Final  . MCV 11/26/2017 112.3* 79.5 - 101.0 fL Final  . MCH 11/26/2017 35.7* 25.1 - 34.0 pg Final  . MCHC 11/26/2017 31.8  31.5 - 36.0 g/dL Final  . RDW 11/26/2017 UNABLE TO RESULT  11.2 - 16.1 % Final  . Platelet Count 11/26/2017 243  145 - 400 K/uL Final  . Neutrophils Relative % 11/26/2017 65  % Final  . Lymphocytes Relative 11/26/2017 24  % Final  . Monocytes Relative 11/26/2017 10  % Final  . Eosinophils Relative 11/26/2017 1  % Final  . Basophils Relative 11/26/2017 0  % Final  . Band Neutrophils 11/26/2017 0  % Final  . Metamyelocytes Relative 11/26/2017 0  % Final  . Myelocytes 11/26/2017 0  % Final  . Promyelocytes Absolute 11/26/2017 0  % Final  . Blasts 11/26/2017 0  % Final  . nRBC 11/26/2017 0  0 /100 WBC Final  . Other 11/26/2017 0  % Final  . Neutro Abs 11/26/2017 2.4  1.5 - 6.5 K/uL Final  . Lymphs Abs 11/26/2017 0.9  0.9 - 3.3 K/uL Final  . Monocytes Absolute 11/26/2017 0.4  0.1 - 0.9 K/uL Final  . Eosinophils Absolute 11/26/2017 0.0  0.0 - 0.5 K/uL Final  . Basophils Absolute 11/26/2017 0.0  0.0 - 0.1 K/uL Final  . Smear Review 11/26/2017 Large platelets seen   Final   Performed at [facility], [place] [address], [place], [place] [address]  . Retic Ct Pct 11/26/2017 1.9  0.7 - 2.1 % Final  . RBC. 11/26/2017 2.27* 3.70 - 5.45 MIL/uL Final  . Retic Count, Absolute 11/26/2017 43.1  33.7 - 90.7 K/uL Final   Performed at [facility], [place] [address] [place], [place] [address]  . Blood Bank Specimen 11/26/2017 SAMPLE AVAILABLE FOR TESTING   Final  . Sample Expiration 11/26/2017    Final                   Value:11/29/2017 Performed at [facility], [place] [address] [place], [place] [address]        Ardath Sax, MD

## 2017-12-18 NOTE — Assessment & Plan Note (Signed)
84 y.o. female with del 5q MDS diagnosis, currently managed supportive care risk over initiation of lenalidomide therapy based on the previous history of DVT, peptic ulcer disease, and GI bleeding in addition to multiple other comorbidities including pulmonary fibrosis, congestive heart failure, and atrial fibrillation. Patient underwent appropriately usual evaluation locally confirming presence of myelodysplastic syndrome. Our additional evaluation demonstrated reasonably high erythropoietin level making supplementation unlikely to be helpful in relieving anemia.  At the present time, patient is requiring infrequent transfusions.  Current average is approximately 2 units/month.  Patient tolerated transfusions well, but does have positive anti-K antibodies making crossmatching slightly more difficult.  Previously, we have discussed potential initiation of iron chelation with the patient due to repeat transfusions leading to the iron overload.  Ultimately, patient expresses no desire for initiation of such therapy due to potential risks.  She is accepting of the potential complications of iron overload such as arthritis, diabetes, liver injury, and potential development of heart failure.   Plan: --Continue labs QoWk based on transfusion requirements over the past month.  No further testing for iron burden as the patient is not likely to accept iron chelation therapy in the future. --Continue transfusion support: 1U pRBCs for Hgb <=9.0g/dL, all products leukoreduced and irradiated; --Return to clinic in 3 months with lab work and possible transfusion support

## 2017-12-19 ENCOUNTER — Inpatient Hospital Stay: Payer: Medicare Other

## 2017-12-19 DIAGNOSIS — K219 Gastro-esophageal reflux disease without esophagitis: Secondary | ICD-10-CM | POA: Diagnosis not present

## 2017-12-19 DIAGNOSIS — K921 Melena: Secondary | ICD-10-CM

## 2017-12-19 DIAGNOSIS — I10 Essential (primary) hypertension: Secondary | ICD-10-CM | POA: Diagnosis not present

## 2017-12-19 DIAGNOSIS — I509 Heart failure, unspecified: Secondary | ICD-10-CM | POA: Diagnosis not present

## 2017-12-19 DIAGNOSIS — D46C Myelodysplastic syndrome with isolated del(5q) chromosomal abnormality: Secondary | ICD-10-CM

## 2017-12-19 DIAGNOSIS — E785 Hyperlipidemia, unspecified: Secondary | ICD-10-CM | POA: Diagnosis not present

## 2017-12-19 DIAGNOSIS — M810 Age-related osteoporosis without current pathological fracture: Secondary | ICD-10-CM | POA: Diagnosis not present

## 2017-12-19 DIAGNOSIS — D619 Aplastic anemia, unspecified: Secondary | ICD-10-CM

## 2017-12-19 LAB — RETICULOCYTES
RBC.: 2.6 MIL/uL — ABNORMAL LOW (ref 3.70–5.45)
RETIC CT PCT: 1.6 % (ref 0.7–2.1)
Retic Count, Absolute: 41.6 10*3/uL (ref 33.7–90.7)

## 2017-12-19 LAB — COMPREHENSIVE METABOLIC PANEL
ALT: 15 U/L (ref 0–55)
AST: 15 U/L (ref 5–34)
Albumin: 3.6 g/dL (ref 3.5–5.0)
Alkaline Phosphatase: 70 U/L (ref 40–150)
Anion gap: 9 (ref 3–11)
BUN: 27 mg/dL — ABNORMAL HIGH (ref 7–26)
CO2: 26 mmol/L (ref 22–29)
CREATININE: 1.01 mg/dL (ref 0.60–1.10)
Calcium: 9.3 mg/dL (ref 8.4–10.4)
Chloride: 106 mmol/L (ref 98–109)
GFR calc non Af Amer: 49 mL/min — ABNORMAL LOW (ref >60)
GFR, EST AFRICAN AMERICAN: 57 mL/min — AB (ref >60)
GLUCOSE: 99 mg/dL (ref 70–140)
Potassium: 4.4 mmol/L (ref 3.5–5.1)
SODIUM: 141 mmol/L (ref 136–145)
Total Bilirubin: 0.5 mg/dL (ref 0.2–1.2)
Total Protein: 6.7 g/dL (ref 6.4–8.3)

## 2017-12-19 LAB — CBC WITH DIFFERENTIAL/PLATELET
Basophils Absolute: 0 10*3/uL (ref 0.0–0.1)
Basophils Relative: 1 %
EOS ABS: 0 10*3/uL (ref 0.0–0.5)
EOS PCT: 0 %
HCT: 28.2 % — ABNORMAL LOW (ref 34.8–46.6)
Hemoglobin: 9.2 g/dL — ABNORMAL LOW (ref 11.6–15.9)
LYMPHS ABS: 1 10*3/uL (ref 0.9–3.3)
Lymphocytes Relative: 25 %
MCH: 35.4 pg — AB (ref 25.1–34.0)
MCHC: 32.6 g/dL (ref 31.5–36.0)
MCV: 108.5 fL — ABNORMAL HIGH (ref 79.5–101.0)
Monocytes Absolute: 0.4 10*3/uL (ref 0.1–0.9)
Monocytes Relative: 10 %
Neutro Abs: 2.5 10*3/uL (ref 1.5–6.5)
Neutrophils Relative %: 64 %
PLATELETS: 271 10*3/uL (ref 145–400)
RBC: 2.6 MIL/uL — ABNORMAL LOW (ref 3.70–5.45)
RDW: 24.3 % — AB (ref 11.2–14.5)
WBC: 3.9 10*3/uL (ref 3.9–10.3)

## 2017-12-19 LAB — FERRITIN: Ferritin: 1227 ng/mL — ABNORMAL HIGH (ref 9–269)

## 2017-12-19 LAB — IRON AND TIBC
IRON: 243 ug/dL — AB (ref 41–142)
Saturation Ratios: 99 % — ABNORMAL HIGH (ref 21–57)
TIBC: 246 ug/dL (ref 236–444)
UIBC: 3 ug/dL

## 2017-12-21 LAB — SAMPLE TO BLOOD BANK

## 2018-01-01 ENCOUNTER — Other Ambulatory Visit: Payer: Medicare Other

## 2018-01-02 ENCOUNTER — Inpatient Hospital Stay: Payer: Medicare Other

## 2018-01-02 ENCOUNTER — Ambulatory Visit (HOSPITAL_COMMUNITY)
Admission: RE | Admit: 2018-01-02 | Discharge: 2018-01-02 | Disposition: A | Discharge status: Home / Self Care | Payer: Medicare Other | Source: Ambulatory Visit | Attending: Hematology and Oncology | Admitting: Hematology and Oncology

## 2018-01-02 ENCOUNTER — Inpatient Hospital Stay: Payer: Medicare Other | Attending: Hematology and Oncology

## 2018-01-02 ENCOUNTER — Other Ambulatory Visit: Payer: Self-pay

## 2018-01-02 DIAGNOSIS — E785 Hyperlipidemia, unspecified: Secondary | ICD-10-CM | POA: Insufficient documentation

## 2018-01-02 DIAGNOSIS — Z86718 Personal history of other venous thrombosis and embolism: Secondary | ICD-10-CM | POA: Insufficient documentation

## 2018-01-02 DIAGNOSIS — F419 Anxiety disorder, unspecified: Secondary | ICD-10-CM | POA: Diagnosis not present

## 2018-01-02 DIAGNOSIS — D619 Aplastic anemia, unspecified: Secondary | ICD-10-CM | POA: Insufficient documentation

## 2018-01-02 DIAGNOSIS — D46C Myelodysplastic syndrome with isolated del(5q) chromosomal abnormality: Secondary | ICD-10-CM | POA: Insufficient documentation

## 2018-01-02 DIAGNOSIS — I509 Heart failure, unspecified: Secondary | ICD-10-CM | POA: Insufficient documentation

## 2018-01-02 DIAGNOSIS — K219 Gastro-esophageal reflux disease without esophagitis: Secondary | ICD-10-CM | POA: Diagnosis not present

## 2018-01-02 DIAGNOSIS — Z79899 Other long term (current) drug therapy: Secondary | ICD-10-CM | POA: Diagnosis not present

## 2018-01-02 DIAGNOSIS — I10 Essential (primary) hypertension: Secondary | ICD-10-CM | POA: Insufficient documentation

## 2018-01-02 DIAGNOSIS — M810 Age-related osteoporosis without current pathological fracture: Secondary | ICD-10-CM | POA: Diagnosis not present

## 2018-01-02 DIAGNOSIS — K921 Melena: Secondary | ICD-10-CM

## 2018-01-02 DIAGNOSIS — M1990 Unspecified osteoarthritis, unspecified site: Secondary | ICD-10-CM | POA: Insufficient documentation

## 2018-01-02 LAB — CBC WITH DIFFERENTIAL (CANCER CENTER ONLY)
BASOS ABS: 0.1 10*3/uL (ref 0.0–0.1)
BASOS PCT: 2 %
EOS ABS: 0 10*3/uL (ref 0.0–0.5)
Eosinophils Relative: 0 %
HCT: 23.9 % — ABNORMAL LOW (ref 34.8–46.6)
Hemoglobin: 7.7 g/dL — ABNORMAL LOW (ref 11.6–15.9)
Lymphocytes Relative: 31 %
Lymphs Abs: 1 10*3/uL (ref 0.9–3.3)
MCH: 36 pg — AB (ref 25.1–34.0)
MCHC: 32.2 g/dL (ref 31.5–36.0)
MCV: 111.7 fL — ABNORMAL HIGH (ref 79.5–101.0)
Monocytes Absolute: 0.4 10*3/uL (ref 0.1–0.9)
Monocytes Relative: 11 %
NEUTROS PCT: 56 %
Neutro Abs: 1.9 10*3/uL (ref 1.5–6.5)
Platelet Count: 254 10*3/uL (ref 145–400)
RBC: 2.14 MIL/uL — AB (ref 3.70–5.45)
WBC: 3.4 10*3/uL — AB (ref 3.9–10.3)

## 2018-01-02 LAB — SAMPLE TO BLOOD BANK

## 2018-01-02 LAB — RETICULOCYTES
RBC.: 2.14 MIL/uL — AB (ref 3.70–5.45)
RETIC COUNT ABSOLUTE: 47.1 10*3/uL (ref 33.7–90.7)
RETIC CT PCT: 2.2 % — AB (ref 0.7–2.1)

## 2018-01-02 LAB — PREPARE RBC (CROSSMATCH)

## 2018-01-02 MED ORDER — ACETAMINOPHEN 325 MG PO TABS
650.0000 mg | ORAL_TABLET | Freq: Once | ORAL | Status: AC
  Administered 2018-01-02: 650 mg via ORAL

## 2018-01-02 MED ORDER — SODIUM CHLORIDE 0.9% FLUSH
3.0000 mL | INTRAVENOUS | Status: DC | PRN
  Filled 2018-01-02: qty 10

## 2018-01-02 MED ORDER — HEPARIN SOD (PORK) LOCK FLUSH 100 UNIT/ML IV SOLN
250.0000 [IU] | INTRAVENOUS | Status: DC | PRN
  Filled 2018-01-02: qty 5

## 2018-01-02 MED ORDER — DIPHENHYDRAMINE HCL 25 MG PO CAPS
ORAL_CAPSULE | ORAL | Status: AC
  Filled 2018-01-02: qty 1

## 2018-01-02 MED ORDER — DIPHENHYDRAMINE HCL 25 MG PO CAPS
25.0000 mg | ORAL_CAPSULE | Freq: Once | ORAL | Status: AC
  Administered 2018-01-02: 25 mg via ORAL

## 2018-01-02 MED ORDER — ACETAMINOPHEN 325 MG PO TABS
ORAL_TABLET | ORAL | Status: AC
  Filled 2018-01-02: qty 2

## 2018-01-02 NOTE — Patient Instructions (Signed)

## 2018-01-03 LAB — BPAM RBC
BLOOD PRODUCT EXPIRATION DATE: 201903292359
ISSUE DATE / TIME: 201903011339
UNIT TYPE AND RH: 7300

## 2018-01-03 LAB — TYPE AND SCREEN
ABO/RH(D): B POS
Antibody Screen: POSITIVE
Unit division: 0

## 2018-01-06 ENCOUNTER — Telehealth: Payer: Self-pay

## 2018-01-06 NOTE — Telephone Encounter (Signed)
Spoke to patient's son Myrle Sheng and let him know I spoke to Carolynn Sayers, CRNI at [facility] about lab draws in the home and that [facility] stated this is not an option at this time for the patient. Myrle Sheng stated he will keep patient's lab and infusion appointments on the same day for now. He stated he will bring patient for lab draws and then possibly take the patient home and bring her back at her scheduled infusion time. No changes needed to appointments at this time. Myrle Sheng will call with any question or concerns.

## 2018-01-07 ENCOUNTER — Other Ambulatory Visit: Payer: Self-pay | Admitting: *Deleted

## 2018-01-07 DIAGNOSIS — G894 Chronic pain syndrome: Secondary | ICD-10-CM

## 2018-01-07 MED ORDER — HYDROCODONE-ACETAMINOPHEN 5-325 MG PO TABS
1.0000 | ORAL_TABLET | Freq: Every day | ORAL | 0 refills | Status: DC
Start: 2018-01-07 — End: 2018-02-10

## 2018-01-15 ENCOUNTER — Other Ambulatory Visit: Payer: Medicare Other

## 2018-01-16 ENCOUNTER — Other Ambulatory Visit: Payer: Self-pay | Admitting: Medical Oncology

## 2018-01-16 ENCOUNTER — Inpatient Hospital Stay: Payer: Medicare Other

## 2018-01-16 DIAGNOSIS — I509 Heart failure, unspecified: Secondary | ICD-10-CM | POA: Diagnosis not present

## 2018-01-16 DIAGNOSIS — I10 Essential (primary) hypertension: Secondary | ICD-10-CM | POA: Diagnosis not present

## 2018-01-16 DIAGNOSIS — K921 Melena: Secondary | ICD-10-CM

## 2018-01-16 DIAGNOSIS — D46C Myelodysplastic syndrome with isolated del(5q) chromosomal abnormality: Secondary | ICD-10-CM

## 2018-01-16 DIAGNOSIS — K219 Gastro-esophageal reflux disease without esophagitis: Secondary | ICD-10-CM | POA: Diagnosis not present

## 2018-01-16 DIAGNOSIS — D649 Anemia, unspecified: Secondary | ICD-10-CM

## 2018-01-16 DIAGNOSIS — D619 Aplastic anemia, unspecified: Secondary | ICD-10-CM

## 2018-01-16 DIAGNOSIS — M810 Age-related osteoporosis without current pathological fracture: Secondary | ICD-10-CM | POA: Diagnosis not present

## 2018-01-16 DIAGNOSIS — E785 Hyperlipidemia, unspecified: Secondary | ICD-10-CM | POA: Diagnosis not present

## 2018-01-16 LAB — COMPREHENSIVE METABOLIC PANEL
ALT: 20 U/L (ref 0–55)
ANION GAP: 8 (ref 3–11)
AST: 18 U/L (ref 5–34)
Albumin: 3.6 g/dL (ref 3.5–5.0)
Alkaline Phosphatase: 66 U/L (ref 40–150)
BILIRUBIN TOTAL: 0.6 mg/dL (ref 0.2–1.2)
BUN: 21 mg/dL (ref 7–26)
CHLORIDE: 107 mmol/L (ref 98–109)
CO2: 26 mmol/L (ref 22–29)
Calcium: 9.6 mg/dL (ref 8.4–10.4)
Creatinine, Ser: 1.14 mg/dL — ABNORMAL HIGH (ref 0.60–1.10)
GFR, EST AFRICAN AMERICAN: 49 mL/min — AB (ref >60)
GFR, EST NON AFRICAN AMERICAN: 43 mL/min — AB (ref >60)
Glucose, Bld: 114 mg/dL (ref 70–140)
POTASSIUM: 4 mmol/L (ref 3.5–5.1)
Sodium: 141 mmol/L (ref 136–145)
TOTAL PROTEIN: 6.8 g/dL (ref 6.4–8.3)

## 2018-01-16 LAB — CBC WITH DIFFERENTIAL (CANCER CENTER ONLY)
BASOS PCT: 1 %
Basophils Absolute: 0.1 10*3/uL (ref 0.0–0.1)
EOS PCT: 0 %
Eosinophils Absolute: 0 10*3/uL (ref 0.0–0.5)
HEMATOCRIT: 26.2 % — AB (ref 34.8–46.6)
Hemoglobin: 8.5 g/dL — ABNORMAL LOW (ref 11.6–15.9)
Lymphocytes Relative: 26 %
Lymphs Abs: 1.1 10*3/uL (ref 0.9–3.3)
MCH: 35.3 pg — ABNORMAL HIGH (ref 25.1–34.0)
MCHC: 32.4 g/dL (ref 31.5–36.0)
MCV: 108.7 fL — ABNORMAL HIGH (ref 79.5–101.0)
MONO ABS: 0.5 10*3/uL (ref 0.1–0.9)
MONOS PCT: 13 %
Neutro Abs: 2.5 10*3/uL (ref 1.5–6.5)
Neutrophils Relative %: 60 %
PLATELETS: 305 10*3/uL (ref 145–400)
RBC: 2.41 MIL/uL — ABNORMAL LOW (ref 3.70–5.45)
RDW: UNDETERMINED % (ref 11.2–14.5)
WBC Count: 4.2 10*3/uL (ref 3.9–10.3)

## 2018-01-16 LAB — IRON AND TIBC
IRON: 252 ug/dL — AB (ref 41–142)
SATURATION RATIOS: 102 % — AB (ref 21–57)
TIBC: 248 ug/dL (ref 236–444)
UIBC: UNDETERMINED ug/dL

## 2018-01-16 LAB — SAMPLE TO BLOOD BANK

## 2018-01-16 LAB — RETICULOCYTES
RBC.: 2.41 MIL/uL — AB (ref 3.70–5.45)
RETIC COUNT ABSOLUTE: 48.2 10*3/uL (ref 33.7–90.7)
RETIC CT PCT: 2 % (ref 0.7–2.1)

## 2018-01-16 LAB — PREPARE RBC (CROSSMATCH)

## 2018-01-16 LAB — FERRITIN: FERRITIN: 1183 ng/mL — AB (ref 9–269)

## 2018-01-16 MED ORDER — ACETAMINOPHEN 325 MG PO TABS
650.0000 mg | ORAL_TABLET | Freq: Once | ORAL | Status: AC
  Administered 2018-01-16: 650 mg via ORAL

## 2018-01-16 MED ORDER — SODIUM CHLORIDE 0.9 % IV SOLN: 250.0000 mL | Freq: Once | INTRAVENOUS | Status: DC

## 2018-01-16 MED ORDER — ACETAMINOPHEN 325 MG PO TABS
ORAL_TABLET | ORAL | Status: AC
  Filled 2018-01-16: qty 2

## 2018-01-17 LAB — BPAM RBC
BLOOD PRODUCT EXPIRATION DATE: 201904022359
ISSUE DATE / TIME: 201903151430
UNIT TYPE AND RH: 5100

## 2018-01-17 LAB — TYPE AND SCREEN
ABO/RH(D): B POS
Antibody Screen: POSITIVE
DONOR AG TYPE: NEGATIVE
UNIT DIVISION: 0

## 2018-01-28 ENCOUNTER — Other Ambulatory Visit: Payer: Self-pay | Admitting: Internal Medicine

## 2018-01-29 ENCOUNTER — Other Ambulatory Visit: Payer: Medicare Other

## 2018-01-30 ENCOUNTER — Other Ambulatory Visit: Payer: Self-pay | Admitting: *Deleted

## 2018-01-30 ENCOUNTER — Other Ambulatory Visit: Payer: Self-pay

## 2018-01-30 ENCOUNTER — Inpatient Hospital Stay: Payer: Medicare Other

## 2018-01-30 DIAGNOSIS — I509 Heart failure, unspecified: Secondary | ICD-10-CM | POA: Diagnosis not present

## 2018-01-30 DIAGNOSIS — E785 Hyperlipidemia, unspecified: Secondary | ICD-10-CM | POA: Diagnosis not present

## 2018-01-30 DIAGNOSIS — D649 Anemia, unspecified: Secondary | ICD-10-CM

## 2018-01-30 DIAGNOSIS — K921 Melena: Secondary | ICD-10-CM

## 2018-01-30 DIAGNOSIS — K219 Gastro-esophageal reflux disease without esophagitis: Secondary | ICD-10-CM | POA: Diagnosis not present

## 2018-01-30 DIAGNOSIS — I10 Essential (primary) hypertension: Secondary | ICD-10-CM | POA: Diagnosis not present

## 2018-01-30 DIAGNOSIS — D46C Myelodysplastic syndrome with isolated del(5q) chromosomal abnormality: Secondary | ICD-10-CM

## 2018-01-30 DIAGNOSIS — M810 Age-related osteoporosis without current pathological fracture: Secondary | ICD-10-CM | POA: Diagnosis not present

## 2018-01-30 DIAGNOSIS — D619 Aplastic anemia, unspecified: Secondary | ICD-10-CM

## 2018-01-30 LAB — RETICULOCYTES
RBC.: 2.42 MIL/uL — ABNORMAL LOW (ref 3.70–5.45)
Retic Count, Absolute: 29 10*3/uL — ABNORMAL LOW (ref 33.7–90.7)
Retic Ct Pct: 1.2 % (ref 0.7–2.1)

## 2018-01-30 LAB — SAMPLE TO BLOOD BANK

## 2018-01-30 LAB — CBC WITH DIFFERENTIAL (CANCER CENTER ONLY)
Basophils Absolute: 0 10*3/uL (ref 0.0–0.1)
Basophils Relative: 1 %
Eosinophils Absolute: 0 10*3/uL (ref 0.0–0.5)
Eosinophils Relative: 0 %
HEMATOCRIT: 25.9 % — AB (ref 34.8–46.6)
HEMOGLOBIN: 8.3 g/dL — AB (ref 11.6–15.9)
LYMPHS ABS: 0.7 10*3/uL — AB (ref 0.9–3.3)
Lymphocytes Relative: 23 %
MCH: 34.3 pg — AB (ref 25.1–34.0)
MCHC: 32 g/dL (ref 31.5–36.0)
MCV: 107 fL — AB (ref 79.5–101.0)
MONO ABS: 0.4 10*3/uL (ref 0.1–0.9)
MONOS PCT: 12 %
NEUTROS ABS: 1.9 10*3/uL (ref 1.5–6.5)
Neutrophils Relative %: 64 %
Platelet Count: 179 10*3/uL (ref 145–400)
RBC: 2.42 MIL/uL — ABNORMAL LOW (ref 3.70–5.45)
RDW: 25.9 % — AB (ref 11.2–14.5)
WBC Count: 3 10*3/uL — ABNORMAL LOW (ref 3.9–10.3)

## 2018-01-30 LAB — PREPARE RBC (CROSSMATCH)

## 2018-01-30 MED ORDER — DIPHENHYDRAMINE HCL 25 MG PO CAPS
ORAL_CAPSULE | ORAL | Status: AC
  Filled 2018-01-30: qty 1

## 2018-01-30 MED ORDER — ACETAMINOPHEN 325 MG PO TABS
ORAL_TABLET | ORAL | Status: AC
  Filled 2018-01-30: qty 2

## 2018-01-30 MED ORDER — SODIUM CHLORIDE 0.9 % IV SOLN
250.0000 mL | Freq: Once | INTRAVENOUS | Status: AC
  Administered 2018-01-30: 250 mL via INTRAVENOUS

## 2018-01-30 MED ORDER — ACETAMINOPHEN 325 MG PO TABS
650.0000 mg | ORAL_TABLET | Freq: Once | ORAL | Status: AC
  Administered 2018-01-30: 650 mg via ORAL

## 2018-01-30 MED ORDER — DIPHENHYDRAMINE HCL 25 MG PO CAPS
25.0000 mg | ORAL_CAPSULE | Freq: Once | ORAL | Status: AC
  Administered 2018-01-30: 25 mg via ORAL

## 2018-01-30 NOTE — Patient Instructions (Signed)

## 2018-02-03 LAB — TYPE AND SCREEN
ABO/RH(D): B POS
Antibody Screen: POSITIVE
DONOR AG TYPE: NEGATIVE
DONOR AG TYPE: NEGATIVE
UNIT DIVISION: 0
Unit division: 0

## 2018-02-03 LAB — BPAM RBC
BLOOD PRODUCT EXPIRATION DATE: 201904062359
Blood Product Expiration Date: 201904052359
ISSUE DATE / TIME: 201903291407
UNIT TYPE AND RH: 7300
UNIT TYPE AND RH: 7300

## 2018-02-10 ENCOUNTER — Other Ambulatory Visit: Payer: Self-pay | Admitting: *Deleted

## 2018-02-10 DIAGNOSIS — G894 Chronic pain syndrome: Secondary | ICD-10-CM

## 2018-02-10 MED ORDER — HYDROCODONE-ACETAMINOPHEN 5-325 MG PO TABS: 1.0000 | ORAL_TABLET | Freq: Every day | ORAL | 0 refills | Status: DC

## 2018-02-10 NOTE — Telephone Encounter (Signed)
Patient daughter called and requested refill NCCSRS Database Verified LR: 01/07/18 Pharmacy Confirmed Pended Rx and sent to Dr. Eulas Post for approval.

## 2018-02-12 ENCOUNTER — Other Ambulatory Visit: Payer: Medicare Other

## 2018-02-13 ENCOUNTER — Inpatient Hospital Stay: Payer: Medicare Other

## 2018-02-13 ENCOUNTER — Other Ambulatory Visit: Payer: Self-pay

## 2018-02-13 ENCOUNTER — Inpatient Hospital Stay: Payer: Medicare Other | Attending: Hematology and Oncology

## 2018-02-13 DIAGNOSIS — Z79899 Other long term (current) drug therapy: Secondary | ICD-10-CM | POA: Diagnosis not present

## 2018-02-13 DIAGNOSIS — D46C Myelodysplastic syndrome with isolated del(5q) chromosomal abnormality: Secondary | ICD-10-CM | POA: Insufficient documentation

## 2018-02-13 DIAGNOSIS — K921 Melena: Secondary | ICD-10-CM

## 2018-02-13 DIAGNOSIS — D649 Anemia, unspecified: Secondary | ICD-10-CM

## 2018-02-13 DIAGNOSIS — D619 Aplastic anemia, unspecified: Secondary | ICD-10-CM

## 2018-02-13 LAB — COMPREHENSIVE METABOLIC PANEL
ALT: 12 U/L (ref 0–55)
ANION GAP: 8 (ref 3–11)
AST: 12 U/L (ref 5–34)
Albumin: 3.3 g/dL — ABNORMAL LOW (ref 3.5–5.0)
Alkaline Phosphatase: 63 U/L (ref 40–150)
BUN: 22 mg/dL (ref 7–26)
CALCIUM: 9.1 mg/dL (ref 8.4–10.4)
CO2: 28 mmol/L (ref 22–29)
Chloride: 105 mmol/L (ref 98–109)
Creatinine, Ser: 1.12 mg/dL — ABNORMAL HIGH (ref 0.60–1.10)
GFR calc non Af Amer: 43 mL/min — ABNORMAL LOW (ref >60)
GFR, EST AFRICAN AMERICAN: 50 mL/min — AB (ref >60)
Glucose, Bld: 93 mg/dL (ref 70–140)
POTASSIUM: 4.6 mmol/L (ref 3.5–5.1)
Sodium: 141 mmol/L (ref 136–145)
Total Bilirubin: 0.5 mg/dL (ref 0.2–1.2)
Total Protein: 6.7 g/dL (ref 6.4–8.3)

## 2018-02-13 LAB — CBC WITH DIFFERENTIAL (CANCER CENTER ONLY)
BASOS ABS: 0 10*3/uL (ref 0.0–0.1)
Basophils Relative: 1 %
EOS ABS: 0 10*3/uL (ref 0.0–0.5)
Eosinophils Relative: 0 %
HEMATOCRIT: 23.3 % — AB (ref 34.8–46.6)
Hemoglobin: 7.5 g/dL — ABNORMAL LOW (ref 11.6–15.9)
LYMPHS ABS: 1.4 10*3/uL (ref 0.9–3.3)
Lymphocytes Relative: 40 %
MCH: 33.8 pg (ref 25.1–34.0)
MCHC: 32.2 g/dL (ref 31.5–36.0)
MCV: 105 fL — ABNORMAL HIGH (ref 79.5–101.0)
MONOS PCT: 10 %
Monocytes Absolute: 0.4 10*3/uL (ref 0.1–0.9)
Neutro Abs: 1.7 10*3/uL (ref 1.5–6.5)
Neutrophils Relative %: 49 %
PLATELETS: 298 10*3/uL (ref 145–400)
RBC: 2.22 MIL/uL — ABNORMAL LOW (ref 3.70–5.45)
WBC Count: 3.5 10*3/uL — ABNORMAL LOW (ref 3.9–10.3)

## 2018-02-13 LAB — SAMPLE TO BLOOD BANK

## 2018-02-13 LAB — RETICULOCYTES
RBC.: 2.22 MIL/uL — AB (ref 3.70–5.45)
RETIC COUNT ABSOLUTE: 37.7 10*3/uL (ref 33.7–90.7)
RETIC CT PCT: 1.7 % (ref 0.7–2.1)

## 2018-02-13 LAB — PREPARE RBC (CROSSMATCH)

## 2018-02-13 LAB — FERRITIN: Ferritin: 1265 ng/mL — ABNORMAL HIGH (ref 9–269)

## 2018-02-13 MED ORDER — DIPHENHYDRAMINE HCL 25 MG PO CAPS
25.0000 mg | ORAL_CAPSULE | Freq: Once | ORAL | Status: AC
  Administered 2018-02-13: 25 mg via ORAL

## 2018-02-13 MED ORDER — SODIUM CHLORIDE 0.9% FLUSH
10.0000 mL | INTRAVENOUS | Status: DC | PRN
  Filled 2018-02-13: qty 10

## 2018-02-13 MED ORDER — ACETAMINOPHEN 325 MG PO TABS
650.0000 mg | ORAL_TABLET | Freq: Once | ORAL | Status: AC
  Administered 2018-02-13: 650 mg via ORAL

## 2018-02-13 MED ORDER — ACETAMINOPHEN 325 MG PO TABS
ORAL_TABLET | ORAL | Status: AC
  Filled 2018-02-13: qty 2

## 2018-02-13 MED ORDER — SODIUM CHLORIDE 0.9 % IV SOLN
250.0000 mL | Freq: Once | INTRAVENOUS | Status: AC
  Administered 2018-02-13: 250 mL via INTRAVENOUS

## 2018-02-13 MED ORDER — DIPHENHYDRAMINE HCL 25 MG PO CAPS
ORAL_CAPSULE | ORAL | Status: AC
  Filled 2018-02-13: qty 1

## 2018-02-13 NOTE — Patient Instructions (Signed)

## 2018-02-16 LAB — TYPE AND SCREEN
ABO/RH(D): B POS
Antibody Screen: POSITIVE
Donor AG Type: NEGATIVE
Unit division: 0

## 2018-02-16 LAB — BPAM RBC
Blood Product Expiration Date: 201905082359
ISSUE DATE / TIME: 201904121429
Unit Type and Rh: 5100

## 2018-02-18 DIAGNOSIS — Z45010 Encounter for checking and testing of cardiac pacemaker pulse generator [battery]: Secondary | ICD-10-CM | POA: Diagnosis not present

## 2018-02-18 DIAGNOSIS — I4891 Unspecified atrial fibrillation: Secondary | ICD-10-CM | POA: Diagnosis not present

## 2018-02-19 DIAGNOSIS — Z45018 Encounter for adjustment and management of other part of cardiac pacemaker: Secondary | ICD-10-CM | POA: Diagnosis not present

## 2018-02-19 DIAGNOSIS — I4891 Unspecified atrial fibrillation: Secondary | ICD-10-CM | POA: Diagnosis not present

## 2018-02-25 ENCOUNTER — Encounter: Payer: Self-pay | Admitting: Internal Medicine

## 2018-02-25 ENCOUNTER — Ambulatory Visit (INDEPENDENT_AMBULATORY_CARE_PROVIDER_SITE_OTHER): Payer: Medicare Other | Admitting: Internal Medicine

## 2018-02-25 VITALS — BP 116/62 | HR 86 | Temp 97.9°F | Ht 63.0 in | Wt 139.8 lb

## 2018-02-25 DIAGNOSIS — D46C Myelodysplastic syndrome with isolated del(5q) chromosomal abnormality: Secondary | ICD-10-CM

## 2018-02-25 DIAGNOSIS — I482 Chronic atrial fibrillation, unspecified: Secondary | ICD-10-CM

## 2018-02-25 DIAGNOSIS — J84112 Idiopathic pulmonary fibrosis: Secondary | ICD-10-CM | POA: Diagnosis not present

## 2018-02-25 DIAGNOSIS — R197 Diarrhea, unspecified: Secondary | ICD-10-CM

## 2018-02-25 DIAGNOSIS — K219 Gastro-esophageal reflux disease without esophagitis: Secondary | ICD-10-CM

## 2018-02-25 DIAGNOSIS — E039 Hypothyroidism, unspecified: Secondary | ICD-10-CM

## 2018-02-25 LAB — TSH: TSH: 0.84 m[IU]/L (ref 0.40–4.50)

## 2018-02-25 MED ORDER — LEVOTHYROXINE SODIUM 100 MCG PO TABS: 100.0000 ug | ORAL_TABLET | Freq: Every day | ORAL | 3 refills | Status: AC

## 2018-02-25 MED ORDER — SUCRALFATE 1 GM/10ML PO SUSP: 1.0000 g | Freq: Two times a day (BID) | ORAL | 11 refills | Status: AC

## 2018-02-25 NOTE — Progress Notes (Signed)
Patient ID: Kimberly Horn, female   DOB: 1930/07/02, 84 y.o.   MRN: [id number]   Location:  [facility] OFFICE  Provider: DR Arletha Grippe  Code Status:  Goals of Care:  Advanced Directives 08/06/2017  Does Patient Have a Medical Advance Directive? No  Type of Advance Directive -  Does patient want to make changes to medical advance directive? -  Copy of [place] in Chart? -  Would patient like information on creating a medical advance directive? Yes (MAU/Ambulatory/Procedural Areas - Information given)     Chief Complaint  Patient presents with  . Medical Management of Chronic Issues    3 month follow-up on thyroid, chronic pain, insomnia, and anemia. Patient c/o lightheadness and dizziness x 3 days, patient questions if this is related to inner ear.  Here with daughter-n-law, concerned about mental status   . Medication Refill    Carafate and Synthroid   . Immunizations    Discuss need for pneumonia and TDaP, patient will check with Kimberly Horn (daughter that she use to live with)  . Orders    Patient would like to discuss if she can be without oxygen at times vs 24/7 need     HPI: Patient is a 84 y.o. female seen today for medical management of chronic diseases.  She feels lightheaded x few days. She feels like she needs PRBCs. Her next appt on 02/27/18. Hgb 7.5 on 02/13/18 and she rec'd 1 unit PRBCs. She has rec'd at least 1 unit every other week. Iron and ferritin levels are elevated overall.she has declined chelation tx.   PAF - rate controlled s/p pacer. eliquis stopped 2/2 anemia.  CHF - stable on prn lasix with KCl. Last 2D echo unknown. Followed by cardio in [place], [place]  Idiopathic pulmonary fibrosis - O2 dependent @ 3L/min and increases to 4L/min with moderate exertion. Takes singulair. Has seen pulmonary in the past  Seasonal allergy - stable on singulair. She has allergy to dust and sycamore trees.  Anemia/MDS with 5q deletion - requires frequent PRBCs 2/2 GI  bleeds. 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 Followed by hematology Dr Lebron Conners. Hgb 7.5.   Hypothyroidism - stable on levothyroxine. TSH 0.81; T4 free 1.2  PE/DVT hx - stable. No sx's of exacerbation/recurrence. she does not have an IVC filter. Off eliquis 2/2 anemia  GERD - stable on omeprazole and carafate. Belching controlled. She still has diarrhea intermittently. C diff toxin neg in Oct 2018  Chronic pain syndrome - due to back. She has a hx back sx. Takes norco qhs and daily cymbalta. She has worse pain in upper back between shoulder blades today.   Insomnia - problems falling asleep. stable on melatonin 10mg   Osteoporosis - takes calcium and Vit D. She gets  prolia injection every 6 mos  She has a daughter, Kimberly Horn, who is an Regulatory affairs officer in [place], [place]. 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  Past Medical History:  Diagnosis Date  . Anxiety   . Atrial fibrillation ([place])   . Benign neoplasm of brain ([place])   . CHF (congestive heart failure) ([place])   . Difficulty hearing   . Diverticulitis   . DVT (deep venous thrombosis) ([place])   . Esophageal reflux   . Flu 11/04/2014  . GI bleed   . Hyperlipidemia   . Hypothyroid   . Hypothyroidism   . Low blood pressure  when hbg is bekow 9  . Myelodysplastic syndrome with 5q deletion ([place])   . Osteoarthritis 10/09/2010  . Osteoporosis 10/09/2010  . Pacemaker   . Peptic ulcer    with GERD  . Peptic ulcer   . Pulmonary embolism ([place])   . Pulmonary fibrosis ([place])     Past Surgical History:  Procedure Laterality Date  . ABLATION    . APPENDECTOMY    . BACK SURGERY    . BILATERAL CARPAL TUNNEL RELEASE    . CATARACT EXTRACTION, BILATERAL    . CHOLECYSTECTOMY    . ESOPHAGOGASTRODUODENOSCOPY (EGD) WITH PROPOFOL N/A 12/22/2016   Procedure: ESOPHAGOGASTRODUODENOSCOPY (EGD) WITH PROPOFOL;  Surgeon: Mauri Pole, MD;  Location: [facility];  Service:  Endoscopy;  Laterality: N/A;  . HERNIA REPAIR    . PACEMAKER INSERTION    . TONSILLECTOMY    . TUBAL LIGATION Bilateral      reports that she has never smoked. She has never used smokeless tobacco. She reports that she does not drink alcohol or use drugs. Social History   Socioeconomic History  . Marital status: Widowed    Spouse name: Not on file  . Number of children: 5  . Years of education: Not on file  . Highest education level: Not on file  Occupational History  . Not on file  Social Needs  . Financial resource strain: Not on file  . Food insecurity:    Worry: Not on file    Inability: Not on file  . Transportation needs:    Medical: Not on file    Non-medical: Not on file  Tobacco Use  . Smoking status: Never Smoker  . Smokeless tobacco: Never Used  Substance and Sexual Activity  . Alcohol use: No  . Drug use: No  . Sexual activity: Never  Lifestyle  . Physical activity:    Days per week: Not on file    Minutes per session: Not on file  . Stress: Not on file  Relationships  . Social connections:    Talks on phone: Not on file    Gets together: Not on file    Attends religious service: Not on file    Active member of club or organization: Not on file    Attends meetings of clubs or organizations: Not on file    Relationship status: Not on file  . Intimate partner violence:    Fear of current or ex partner: Not on file    Emotionally abused: Not on file    Physically abused: Not on file    Forced sexual activity: Not on file  Other Topics Concern  . Not on file  Social History Narrative   Social History      Diet? I eat what I want      Do you drink/eat things with caffeine? yes      Marital status?            widowed                        What year were you married? 1950      Do you live in a house, apartment, assisted living, condo, trailer, etc.? With family      Is it one or more stories? 2 stories but stay on 1st floor       How many  persons live in your home?       Do you have any pets in your home? (  please list) usually 0 (but currently 1 cat)      Highest level of education completed?       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         some                             Type & how often? When I think about it      Advanced Directives      Do you have a living will? no      Do you have a DNR form?     no                             If not, do you want to discuss one? no      Do you have signed POA/HPOA for forms?  no      Functional Status      Do you have difficulty bathing or dressing yourself?      Do you have difficulty preparing food or eating?       Do you have difficulty managing your medications?      Do you have difficulty managing your finances?      Do you have difficulty affording your medications?    Family History  Problem Relation Age of Onset  . Heart attack Mother   . Cancer Father        throat  . Arthritis Sister   . Lung disease Brother   . Heart disease Brother   . Cancer Brother   . Gout Brother   . Arthritis Brother   . Stroke Son   . Diabetes Son     Allergies  Allergen Reactions  . Demerol [Meperidine] Other (See Comments)    INTENSIFIED FEELINGS  . Hydroxyzine Other (See Comments)    INTENSIFIED FEELINGS  . Talwin [Pentazocine] Other (See Comments)    INTENSIFIED FEELINGS    Outpatient Encounter Medications as of 02/25/2018  Medication Sig  . Calcium-Vitamin D-Vitamin K (VIACTIV PO) Take by mouth. Plus D and K 2 by mouth at lunch  . Cholecalciferol (VITAMIN D3) 5000 units CAPS Take 1 capsule by mouth daily.  . DULoxetine (CYMBALTA) 30 MG capsule TAKE 1 CAPSULE(30 MG) BY MOUTH TWICE DAILY  . furosemide (LASIX) 20 MG tablet Take 20 mg by mouth as needed.   Marland Kitchen HYDROcodone-acetaminophen (NORCO/VICODIN) 5-325 MG tablet Take 1 tablet by mouth at bedtime.  Marland Kitchen levothyroxine (SYNTHROID, LEVOTHROID) 100 MCG tablet Take 100  mcg by mouth daily. In the morning  . Melatonin 10 MG CAPS Take 1 tablet by mouth daily.  . montelukast (SINGULAIR) 10 MG tablet Take 10 mg by mouth daily. In the morning  . Multiple Vitamins-Minerals (CENTRUM SILVER ULTRA MENS PO) Take by mouth daily.  Marland Kitchen omeprazole (PRILOSEC) 20 MG capsule TAKE 1 CAPSULE(20 MG) BY MOUTH TWICE DAILY BEFORE A MEAL  . OXYGEN Inhale 3-4 L into the lungs daily.  . potassium chloride SA (K-DUR,KLOR-CON) 20 MEQ tablet TAKE 1 TABLET BY MOUTH DAILY AS NEEDED WHEN TAKING FUROSEMIDE  . sucralfate (CARAFATE) 1 GM/10ML suspension Take 10 mLs (1 g total) by mouth 2 (two) times daily.   Facility-Administered Encounter Medications as of 02/25/2018  Medication  . 0.9 %  sodium chloride infusion  . heparin lock flush 100 unit/mL  . heparin lock  flush 100 unit/mL  . sodium chloride flush (NS) 0.9 % injection 10 mL  . sodium chloride flush (NS) 0.9 % injection 10 mL    Review of Systems:  Review of Systems  Respiratory: Positive for shortness of breath.   Neurological: Positive for dizziness.  All other systems reviewed and are negative.   Health Maintenance  Topic Date Due  . TETANUS/TDAP  01/23/1949  . PNA vac Low Risk Adult (1 of 2 - PCV13) 01/22/1997  . INFLUENZA VACCINE  06/04/2018  . DEXA SCAN  Completed    Physical Exam: Vitals:   02/25/18 1359  BP: 116/62  Pulse: 86  Temp: 97.9 F (36.6 C)  TempSrc: Oral  SpO2: 99%  Weight: 139 lb 12.8 oz (63.4 kg)  Height: 5\' 3"  (1.6 m)   Body mass index is 24.76 kg/m. Physical Exam  Constitutional: She is oriented to person, place, and time. She appears well-developed and well-nourished.  HENT:  Mouth/Throat: Oropharynx is clear and moist. No oropharyngeal exudate.  Eyes: Pupils are equal, round, and reactive to light. No scleral icterus.  Neck: Neck supple. Carotid bruit is not present. No tracheal deviation present. No thyromegaly present.  Cardiovascular: Regular rhythm and intact distal pulses.  Tachycardia present. Exam reveals no gallop and no friction rub.  Murmur (1/6 SEM) heard. Trace LE edema b/l. No calf TTP  Pulmonary/Chest: Effort normal and breath sounds normal. No stridor. No respiratory distress. She has no wheezes. She has no rales.  Abdominal: Soft. Bowel sounds are normal. She exhibits distension. She exhibits no mass. There is no hepatomegaly. There is no tenderness. There is no rebound and no guarding.  Musculoskeletal: She exhibits edema.  Lymphadenopathy:    She has no cervical adenopathy.  Neurological: She is alert and oriented to person, place, and time. She has normal reflexes.  Skin: Skin is warm and dry. No rash noted.  Psychiatric: She has a normal mood and affect. Her behavior is normal. Judgment and thought content normal.    Labs reviewed: Basic Metabolic Panel: Recent Labs    07/16/17 1012  07/30/17 1521  11/28/17 1215 12/19/17 0920 01/16/18 0918 02/13/18 0917  NA 140   < >  --    < >  --  141 141 141  K 4.8   < >  --    < >  --  4.4 4.0 4.6  CL 104  --   --    < >  --  106 107 105  CO2 29   < >  --    < >  --  26 26 28   GLUCOSE 101*   < >  --    < >  --  99 114 93  BUN 21   < >  --    < >  --  27* 21 22  CREATININE 0.99*   < >  --    < >  --  1.01 1.14* 1.12*  CALCIUM 9.6   < >  --    < >  --  9.3 9.6 9.1  TSH 0.85  --  0.458  --  0.81  --   --   --    < > = values in this interval not displayed.   Liver Function Tests: Recent Labs    12/19/17 0920 01/16/18 0918 02/13/18 0917  AST 15 18 12   ALT 15 20 12   ALKPHOS 70 66 63  BILITOT 0.5 0.6 0.5  PROT 6.7 6.8 6.7  ALBUMIN 3.6 3.6 3.3*   No results for input(s): LIPASE, AMYLASE in the last 8760 hours. No results for input(s): AMMONIA in the last 8760 hours. CBC: Recent Labs    01/16/18 0918 01/30/18 0938 02/13/18 0917  WBC 4.2 3.0* 3.5*  NEUTROABS 2.5 1.9 1.7  HGB 8.5* 8.3* 7.5*  HCT 26.2* 25.9* 23.3*  MCV 108.7* 107.0* 105.0*  PLT 305 179 298   Lipid Panel: No results  for input(s): CHOL, HDL, LDLCALC, TRIG, CHOLHDL, LDLDIRECT in the last 8760 hours. No results found for: HGBA1C  Procedures since last visit: No results found.  Assessment/Plan   ICD-10-CM   1. Hypothyroidism (acquired) E03.9 levothyroxine (SYNTHROID, LEVOTHROID) 100 MCG tablet    TSH  2. MDS (myelodysplastic syndrome) with 5q deletion ([place]) D46.C   3. Idiopathic pulmonary fibrosis ([place]) J84.112   4. Chronic atrial fibrillation (HCC) I48.2   5. Gastroesophageal reflux disease without esophagitis K21.9 sucralfate (CARAFATE) 1 GM/10ML suspension  6. Diarrhea, unspecified type R19.7     Will call with lab results  Follow up with hematology as scheduled  Follow up with other specialists as scheduled  Recommend OTC culturelle, activia, yogurt, or floraster daily to keep colon bacteria healthy; may take benefiber or metamucil to bulk stool  Follow up in 3 mos for thyroid d/o, anemia, PAF, PE, GERD   Noe Pittsley S. Perlie Gold  [facility] [address] [place], [place] [address] [phone] Cell (Monday-Friday 8 AM - 5 PM) [phone] After 5 PM and follow prompts

## 2018-02-25 NOTE — Patient Instructions (Addendum)
Will call with lab results  Follow up with hematology as scheduled  Follow up with other specialists as scheduled  Recommend OTC culturelle, activia, yogurt, or floraster daily to keep colon bacteria healthy; may take benefiber or metamucil to bulk stool  Follow up in 3 mos for thyroid d/o, anemia, PAF, PE, GERD

## 2018-02-26 ENCOUNTER — Other Ambulatory Visit: Payer: Medicare Other

## 2018-02-27 ENCOUNTER — Telehealth: Payer: Self-pay | Admitting: Hematology and Oncology

## 2018-02-27 ENCOUNTER — Inpatient Hospital Stay (HOSPITAL_BASED_OUTPATIENT_CLINIC_OR_DEPARTMENT_OTHER): Payer: Medicare Other | Admitting: Hematology and Oncology

## 2018-02-27 ENCOUNTER — Encounter: Payer: Self-pay | Admitting: Hematology and Oncology

## 2018-02-27 ENCOUNTER — Inpatient Hospital Stay: Payer: Medicare Other

## 2018-02-27 VITALS — BP 111/49 | HR 86 | Temp 97.6°F | Resp 17 | Ht 63.0 in | Wt 138.0 lb

## 2018-02-27 DIAGNOSIS — D46C Myelodysplastic syndrome with isolated del(5q) chromosomal abnormality: Secondary | ICD-10-CM

## 2018-02-27 DIAGNOSIS — Z79899 Other long term (current) drug therapy: Secondary | ICD-10-CM

## 2018-02-27 DIAGNOSIS — D619 Aplastic anemia, unspecified: Secondary | ICD-10-CM

## 2018-02-27 LAB — CMP (CANCER CENTER ONLY)
ALK PHOS: 66 U/L (ref 40–150)
ALT: 20 U/L (ref 0–55)
AST: 16 U/L (ref 5–34)
Albumin: 3.8 g/dL (ref 3.5–5.0)
Anion gap: 9 (ref 3–11)
BUN: 24 mg/dL (ref 7–26)
CALCIUM: 10 mg/dL (ref 8.4–10.4)
CO2: 27 mmol/L (ref 22–29)
CREATININE: 0.95 mg/dL (ref 0.60–1.10)
Chloride: 105 mmol/L (ref 98–109)
GFR, Estimated: 53 mL/min — ABNORMAL LOW (ref >60)
Glucose, Bld: 124 mg/dL (ref 70–140)
Potassium: 4.9 mmol/L (ref 3.5–5.1)
SODIUM: 141 mmol/L (ref 136–145)
Total Bilirubin: 0.6 mg/dL (ref 0.2–1.2)
Total Protein: 7.2 g/dL (ref 6.4–8.3)

## 2018-02-27 LAB — CBC WITH DIFFERENTIAL (CANCER CENTER ONLY)
Basophils Absolute: 0 10*3/uL (ref 0.0–0.1)
Basophils Relative: 1 %
EOS ABS: 0 10*3/uL (ref 0.0–0.5)
Eosinophils Relative: 0 %
HCT: 28.2 % — ABNORMAL LOW (ref 34.8–46.6)
HEMOGLOBIN: 9.2 g/dL — AB (ref 11.6–15.9)
LYMPHS PCT: 24 %
Lymphs Abs: 1 10*3/uL (ref 0.9–3.3)
MCH: 33.5 pg (ref 25.1–34.0)
MCHC: 32.6 g/dL (ref 31.5–36.0)
MCV: 102.5 fL — AB (ref 79.5–101.0)
Monocytes Absolute: 0.4 10*3/uL (ref 0.1–0.9)
Monocytes Relative: 10 %
NEUTROS PCT: 65 %
Neutro Abs: 2.8 10*3/uL (ref 1.5–6.5)
Platelet Count: 237 10*3/uL (ref 145–400)
RBC: 2.75 MIL/uL — AB (ref 3.70–5.45)
RDW: 26.5 % — ABNORMAL HIGH (ref 11.2–14.5)
WBC: 4.2 10*3/uL (ref 3.9–10.3)

## 2018-02-27 LAB — LACTATE DEHYDROGENASE: LDH: 260 U/L — ABNORMAL HIGH (ref 125–245)

## 2018-02-27 MED ORDER — ONDANSETRON HCL 8 MG PO TABS
ORAL_TABLET | ORAL | Status: AC
  Filled 2018-02-27: qty 1

## 2018-02-27 MED ORDER — PROCHLORPERAZINE MALEATE 10 MG PO TABS: 10.0000 mg | ORAL_TABLET | Freq: Four times a day (QID) | ORAL | 0 refills | Status: AC | PRN

## 2018-02-27 MED ORDER — ONDANSETRON HCL 8 MG PO TABS
8.0000 mg | ORAL_TABLET | Freq: Once | ORAL | Status: AC
  Administered 2018-02-27: 8 mg via ORAL

## 2018-02-27 NOTE — Telephone Encounter (Signed)
Spoke to patient regarding voicemail left earlier about her mom's appointment time updates.

## 2018-02-27 NOTE — Telephone Encounter (Signed)
Appointments scheduled AVS/Calendar printed per 4/26 los °

## 2018-02-28 LAB — SAMPLE TO BLOOD BANK

## 2018-03-01 ENCOUNTER — Other Ambulatory Visit: Payer: Self-pay | Admitting: Internal Medicine

## 2018-03-13 ENCOUNTER — Inpatient Hospital Stay: Payer: Medicare Other

## 2018-03-13 ENCOUNTER — Other Ambulatory Visit: Payer: Self-pay

## 2018-03-13 ENCOUNTER — Telehealth: Payer: Self-pay

## 2018-03-13 ENCOUNTER — Other Ambulatory Visit: Payer: Self-pay | Admitting: *Deleted

## 2018-03-13 ENCOUNTER — Telehealth: Payer: Self-pay | Admitting: *Deleted

## 2018-03-13 ENCOUNTER — Inpatient Hospital Stay: Payer: Medicare Other | Attending: Hematology and Oncology

## 2018-03-13 DIAGNOSIS — D619 Aplastic anemia, unspecified: Secondary | ICD-10-CM

## 2018-03-13 DIAGNOSIS — D46C Myelodysplastic syndrome with isolated del(5q) chromosomal abnormality: Secondary | ICD-10-CM

## 2018-03-13 DIAGNOSIS — K921 Melena: Secondary | ICD-10-CM

## 2018-03-13 DIAGNOSIS — G894 Chronic pain syndrome: Secondary | ICD-10-CM

## 2018-03-13 DIAGNOSIS — D638 Anemia in other chronic diseases classified elsewhere: Secondary | ICD-10-CM | POA: Insufficient documentation

## 2018-03-13 LAB — CBC WITH DIFFERENTIAL (CANCER CENTER ONLY)
BASOS ABS: 0 10*3/uL (ref 0.0–0.1)
Basophils Relative: 1 %
Eosinophils Absolute: 0 10*3/uL (ref 0.0–0.5)
Eosinophils Relative: 0 %
HEMATOCRIT: 21.5 % — AB (ref 34.8–46.6)
Hemoglobin: 6.8 g/dL — CL (ref 11.6–15.9)
LYMPHS PCT: 33 %
Lymphs Abs: 1 10*3/uL (ref 0.9–3.3)
MCH: 33.3 pg (ref 25.1–34.0)
MCHC: 31.6 g/dL (ref 31.5–36.0)
MCV: 105.4 fL — AB (ref 79.5–101.0)
MONO ABS: 0.4 10*3/uL (ref 0.1–0.9)
Monocytes Relative: 13 %
NEUTROS ABS: 1.5 10*3/uL (ref 1.5–6.5)
Neutrophils Relative %: 53 %
PLATELETS: 209 10*3/uL (ref 145–400)
RBC: 2.04 MIL/uL — AB (ref 3.70–5.45)
WBC: 2.9 10*3/uL — AB (ref 3.9–10.3)

## 2018-03-13 LAB — SAMPLE TO BLOOD BANK

## 2018-03-13 LAB — RETICULOCYTES
RBC.: 2.04 MIL/uL — AB (ref 3.70–5.45)
RETIC COUNT ABSOLUTE: 44.9 10*3/uL (ref 33.7–90.7)
Retic Ct Pct: 2.2 % — ABNORMAL HIGH (ref 0.7–2.1)

## 2018-03-13 LAB — PREPARE RBC (CROSSMATCH)

## 2018-03-13 MED ORDER — HYDROCODONE-ACETAMINOPHEN 5-325 MG PO TABS: 1.0000 | ORAL_TABLET | Freq: Every day | ORAL | 0 refills | Status: DC

## 2018-03-13 MED ORDER — SODIUM CHLORIDE 0.9 % IV SOLN
250.0000 mL | Freq: Once | INTRAVENOUS | Status: AC
  Administered 2018-03-13: 250 mL via INTRAVENOUS

## 2018-03-13 MED ORDER — ACETAMINOPHEN 325 MG PO TABS
650.0000 mg | ORAL_TABLET | Freq: Once | ORAL | Status: AC
  Administered 2018-03-13: 650 mg via ORAL

## 2018-03-13 MED ORDER — DIPHENHYDRAMINE HCL 25 MG PO CAPS
ORAL_CAPSULE | ORAL | Status: AC
  Filled 2018-03-13: qty 1

## 2018-03-13 MED ORDER — ACETAMINOPHEN 325 MG PO TABS
ORAL_TABLET | ORAL | Status: AC
  Filled 2018-03-13: qty 2

## 2018-03-13 MED ORDER — DIPHENHYDRAMINE HCL 25 MG PO CAPS
25.0000 mg | ORAL_CAPSULE | Freq: Once | ORAL | Status: AC
  Administered 2018-03-13: 25 mg via ORAL

## 2018-03-13 NOTE — Telephone Encounter (Signed)
Call from pt's son asking if she will need to come in for transfusion today. YES. He voiced understanding.

## 2018-03-13 NOTE — Telephone Encounter (Signed)
Kimberly Horn, daughter called requesting refill [place] Verified LR: 02/10/2018 Pharmacy Confirmed Pended Rx and sent to Dr. Eulas Post for approval.

## 2018-03-13 NOTE — Patient Instructions (Signed)

## 2018-03-13 NOTE — Telephone Encounter (Signed)
1220-Called Mr. Kimberly Horn and he said his mother is not symptomatic right now even though her Hgb is 6.8.  We will proceed with 1 unit today and I am sending this note to Dr. Lebron Conners in case he wants her to come back next week for another unit.  [name]

## 2018-03-13 NOTE — Telephone Encounter (Signed)
Received VM from pt daughter-in-law this morning requesting referral to Md in [place], [place]. Pt family member in [place] will be caring for pt over the summer. Called [name] back to let her know that Dr. Lebron Conners is out of the office today, but will send him in-basket and desk RN will f/u next week. Daughter-in-law verbalized thanks for the communication. Recommendation for family member in [facility] at [address] [phone].

## 2018-03-15 LAB — TYPE AND SCREEN
ABO/RH(D): B POS
ANTIBODY SCREEN: POSITIVE
DONOR AG TYPE: NEGATIVE
Unit division: 0

## 2018-03-15 LAB — BPAM RBC
Blood Product Expiration Date: 201906062359
ISSUE DATE / TIME: 201905101338
Unit Type and Rh: 7300

## 2018-03-16 ENCOUNTER — Telehealth: Payer: Self-pay

## 2018-03-16 NOTE — Telephone Encounter (Signed)
-----   Message from Ardath Sax, MD sent at 03/15/2018 11:39 AM EDT ----- Regarding: RE: Hemoglobin 6.8 Not at this time. We will stick with the previous plan overall ----- Message ----- From: [name], RN Sent: 03/13/2018  12:21 PM To: Ardath Sax, MD, Tami Lin, RN, # Subject: Hemoglobin 6.8                                 [phone]- Kimberly Horn has a hemoglobin of 6.8 but is not symptomatic based on her son's assessment.  He will bring her in for her one unit today, but I wanted to make you aware of her lower than usual hgb in case you wanted to bring her in next week for another unit. [name]

## 2018-03-22 ENCOUNTER — Other Ambulatory Visit: Payer: Self-pay | Admitting: Internal Medicine

## 2018-03-22 NOTE — Progress Notes (Signed)
[facility] Follow-up Visit:  Assessment: MDS (myelodysplastic syndrome) with 5q deletion ([place]) 84 y.o. female with del 5q MDS diagnosis, currently managed supportive care risk over initiation of lenalidomide therapy based on the previous history of DVT, peptic ulcer disease, and GI bleeding in addition to multiple other comorbidities including pulmonary fibrosis, congestive heart failure, and atrial fibrillation. Patient underwent appropriately usual evaluation locally confirming presence of myelodysplastic syndrome. Our additional evaluation demonstrated reasonably high erythropoietin level making supplementation unlikely to be helpful in relieving anemia.  At the present time, patient is requiring infrequent transfusions.  Current average is approximately 2 units/month.  Patient tolerated transfusions well, but does have positive anti-K & anti-V antibodies making crossmatching slightly more difficult.  Previously, we have discussed potential initiation of iron chelation with the patient due to repeat transfusions leading to the iron overload.  Ultimately, patient expresses no desire for initiation of such therapy due to potential risks.  She is accepting of the potential complications of iron overload such as arthritis, diabetes, liver injury, and potential development of heart failure.  At this time, patient prefers to continue symptom-directed management with supportive care.  Plan: --Continue labs QoWk based on transfusion requirements over the past 3 months.  No further testing for iron burden as the patient is not likely to accept iron chelation therapy in the future. --Continue transfusion support: 1U pRBCs for Hgb <=9.0g/dL, all products leukoreduced and irradiated; -Initiate ondansetron for nausea control. --Return to clinic in 3 months with lab work and possible transfusion support   Voice recognition software was used and creation of this note. Despite my best  effort at editing the text, some misspelling/errors may have occurred.  Orders Placed This Encounter  Procedures  . CBC with Differential (Cancer Center Only)    Standing Status:   Future    Standing Expiration Date:   02/28/2019  . CMP ([facility] only)    Standing Status:   Future    Standing Expiration Date:   02/28/2019  . Iron and TIBC    Standing Status:   Future    Standing Expiration Date:   02/28/2019  . Ferritin    Standing Status:   Future    Standing Expiration Date:   02/28/2019  . Lactate dehydrogenase (LDH)    Standing Status:   Future    Standing Expiration Date:   02/27/2019    All questions were answered.  . The patient knows to call the clinic with any problems, questions or concerns.  This note was electronically signed.    History of Presenting Illness Kimberly Horn is an 84 y.o. female followed in the [facility] for Myelodysplastic syndrome with deletion 5q. The patient was previously managed by Dr. Radene Knee in [place]. Lenalidomide therapy was considered, but wasn't started due to history of peptic ulcer disease with gastrointestinal bleeding causing significant risk of anticoagulation and previous history of DVT. So far, patient was managed symptomatically with transfusions. Patient usually develops symptoms of lightheadedness, dizziness, orthostatic lightheadedness at hemoglobin 9.0g/dL, and symptoms resolved while hemoglobin is above that target.  Patient returns to the clinic for continued hematological monitoring.  At the present time, experiencing increasing fatigue and sensation of being tired.  Today, has complaints of abdominal bloating and nausea.  No diarrhea or constipation.  No emesis.  Also notices that it is harder for her to read but attributes that to a long period of time since her glasses were adjusted..  Oncological/hematological History: --Labs, 12/21/16:  Vit B12 306, Folate    28.5, Fe  209, FeSat 79%, TIBC 263, Ferritin 220; --Labs, 06/04/17:           Vit B12 588, Folate >24.0, Fe 160, FeSat 48%, TIBC 333, Ferritin 448; LDH 379; TSH 2.36, Epo >3750 --Labs, 06/06/17:                  Hgb 6.1,                              Vit B12 588  --BM Bx, 06/07/17: Hypercellular bone marrow with 40% cellularity with megakaryocytic dysplasia and increased to renewing sideroblasts consistent with myelodysplastic syndrome. 3% atypical blasts present; FlowCyto --no evidence of abnormal myeloid maturation or increased blast population.  No evidence of a lymphoproliferative disorder; CytoGen -- 46,XX,del(5)(q15q33)/46,XX; no other cytogenetic abnormalities identified; FISH -- positive for del5q & negative for any other significant abnormalities --Labs, 07/23/17: WBC 4.0, Hgb 9.5, Plt 323; --Labs, 07/30/17:                                                   Fe 221, FeSat 82%, TIBC 269, Ferritin 1222; Epo 457; 1U pRBC transfused --Labs, 09/03/17: WBC 3.5, Hgb 9.8, Plt 266;      Fe 256, FeSat 99%, TIBC 259, Ferritin 1629;  --Clinic visit, 09/04/17: in the interim required 2U pRBC on 08/14/17 (Positive for anti-K Abs) --Labs, 11/26/17: WBC 3.7, Hgb 8.1, Plt 243; Ferritin 1230; --Clinic visit, 11/28/17: Received 4U pRBC since 09/04/17 --Labs, 02/13/18: WBC 3.5, Hgb 7.5, Plt 298; Ferritin 1265 --Clinic visit, 02/27/18: Received 6U pRBC since 11/28/17   Medical History: Past Medical History:  Diagnosis Date  . Anxiety   . Atrial fibrillation ([place])   . Benign neoplasm of brain ([place])   . CHF (congestive heart failure) ([place])   . Difficulty hearing   . Diverticulitis   . DVT (deep venous thrombosis) ([place])   . Esophageal reflux   . Flu 11/04/2014  . GI bleed   . Hyperlipidemia   . Hypothyroid   . Hypothyroidism   . Low blood pressure    when hbg is bekow 9  . Myelodysplastic syndrome with 5q deletion ([place])   . Osteoarthritis 10/09/2010  . Osteoporosis 10/09/2010  . Pacemaker   . Peptic ulcer     with GERD  . Peptic ulcer   . Pulmonary embolism ([place])   . Pulmonary fibrosis [facility])     Surgical History: Past Surgical History:  Procedure Laterality Date  . ABLATION    . APPENDECTOMY    . BACK SURGERY    . BILATERAL CARPAL TUNNEL RELEASE    . CATARACT EXTRACTION, BILATERAL    . CHOLECYSTECTOMY    . ESOPHAGOGASTRODUODENOSCOPY (EGD) WITH PROPOFOL N/A 12/22/2016   Procedure: ESOPHAGOGASTRODUODENOSCOPY (EGD) WITH PROPOFOL;  Surgeon: Mauri Pole, MD;  Location: [facility];  Service: Endoscopy;  Laterality: N/A;  . HERNIA REPAIR    . PACEMAKER INSERTION    . TONSILLECTOMY    . TUBAL LIGATION Bilateral     Family History: Family History  Problem Relation Age of Onset  . Heart attack Mother   . Cancer Father        throat  . Arthritis Sister   . Lung disease Brother   . Heart disease Brother   .  Cancer Brother   . Gout Brother   . Arthritis Brother   . Stroke Son   . Diabetes Son     Social History: Social History   Socioeconomic History  . Marital status: Widowed    Spouse name: Not on file  . Number of children: 5  . Years of education: Not on file  . Highest education level: Not on file  Occupational History  . Not on file  Social Needs  . Financial resource strain: Not on file  . Food insecurity:    Worry: Not on file    Inability: Not on file  . Transportation needs:    Medical: Not on file    Non-medical: Not on file  Tobacco Use  . Smoking status: Never Smoker  . Smokeless tobacco: Never Used  Substance and Sexual Activity  . Alcohol use: No  . Drug use: No  . Sexual activity: Never  Lifestyle  . Physical activity:    Days per week: Not on file    Minutes per session: Not on file  . Stress: Not on file  Relationships  . Social connections:    Talks on phone: Not on file    Gets together: Not on file    Attends religious service: Not on file    Active member of club or organization: Not on file    Attends meetings of clubs or  organizations: Not on file    Relationship status: Not on file  . Intimate partner violence:    Fear of current or ex partner: Not on file    Emotionally abused: Not on file    Physically abused: Not on file    Forced sexual activity: Not on file  Other Topics Concern  . Not on file  Social History Narrative   Social History      Diet? I eat what I want      Do you drink/eat things with caffeine? yes      Marital status?            widowed                        What year were you married? 1950      Do you live in a house, apartment, assisted living, condo, trailer, etc.? With family      Is it one or more stories? 2 stories but stay on 1st floor       How many persons live in your home?       Do you have any pets in your home? (please list) usually 0 (but currently 1 cat)      Highest level of education completed?       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         some                             Type & how often? When I think about it      Advanced Directives      Do you have a living will? no      Do you have a DNR form?     no                             If not,  do you want to discuss one? no      Do you have signed POA/HPOA for forms?  no      Functional Status      Do you have difficulty bathing or dressing yourself?      Do you have difficulty preparing food or eating?       Do you have difficulty managing your medications?      Do you have difficulty managing your finances?      Do you have difficulty affording your medications?    Allergies: Allergies  Allergen Reactions  . Demerol [Meperidine] Other (See Comments)    INTENSIFIED FEELINGS  . Hydroxyzine Other (See Comments)    INTENSIFIED FEELINGS  . Talwin [Pentazocine] Other (See Comments)    INTENSIFIED FEELINGS    Medications:  Current Outpatient Medications  Medication Sig Dispense Refill  . Calcium-Vitamin D-Vitamin K (VIACTIV PO) Take  by mouth. Plus D and K 2 by mouth at lunch    . Cholecalciferol (VITAMIN D3) 5000 units CAPS Take 1 capsule by mouth daily.    . furosemide (LASIX) 20 MG tablet Take 20 mg by mouth as needed.     Marland Kitchen levothyroxine (SYNTHROID, LEVOTHROID) 100 MCG tablet Take 1 tablet (100 mcg total) by mouth daily. In the morning 90 tablet 3  . Melatonin 10 MG CAPS Take 1 tablet by mouth daily.    . montelukast (SINGULAIR) 10 MG tablet Take 10 mg by mouth daily. In the morning    . Multiple Vitamins-Minerals (CENTRUM SILVER ULTRA MENS PO) Take by mouth daily.    Marland Kitchen omeprazole (PRILOSEC) 20 MG capsule TAKE 1 CAPSULE(20 MG) BY MOUTH TWICE DAILY BEFORE A MEAL 60 capsule 3  . OXYGEN Inhale 3-4 L into the lungs daily.    . potassium chloride SA (K-DUR,KLOR-CON) 20 MEQ tablet TAKE 1 TABLET BY MOUTH DAILY AS NEEDED WHEN TAKING FUROSEMIDE 30 tablet 3  . sucralfate (CARAFATE) 1 GM/10ML suspension Take 10 mLs (1 g total) by mouth 2 (two) times daily. 420 mL 11  . DULoxetine (CYMBALTA) 30 MG capsule TAKE 1 CAPSULE(30 MG) BY MOUTH TWICE DAILY 60 capsule 3  . HYDROcodone-acetaminophen (NORCO/VICODIN) 5-325 MG tablet Take 1 tablet by mouth at bedtime. 30 tablet 0  . prochlorperazine (COMPAZINE) 10 MG tablet Take 1 tablet (10 mg total) by mouth every 6 (six) hours as needed for nausea or vomiting. 30 tablet 0   No current facility-administered medications for this visit.    Facility-Administered Medications Ordered in Other Visits  Medication Dose Route Frequency Provider Last Rate Last Dose  . 0.9 %  sodium chloride infusion  250 mL Intravenous Once Cordarryl Monrreal G, MD      . heparin lock flush 100 unit/mL  250 Units Intracatheter PRN Adreanne Yono, Marinell Blight, MD      . heparin lock flush 100 unit/mL  500 Units Intracatheter Daily PRN Jaxston Chohan, Marinell Blight, MD      . sodium chloride flush (NS) 0.9 % injection 10 mL  10 mL Intracatheter PRN Chany Woolworth G, MD      . sodium chloride flush (NS) 0.9 % injection 10 mL  10 mL Intracatheter  PRN Lebron Conners, Marinell Blight, MD        Review of Systems: Review of Systems  Constitutional: Positive for fatigue.  Eyes: Positive for eye problems.  Gastrointestinal: Positive for abdominal distention and nausea.  All other systems reviewed and are negative.    PHYSICAL EXAMINATION Blood pressure (!) 111/49, pulse 86, temperature  97.6 F (36.4 C), temperature source Oral, resp. rate 17, height 5' 3"  (1.6 m), weight 138 lb (62.6 kg), SpO2 100 %.  ECOG PERFORMANCE STATUS: 2 - Symptomatic, <50% confined to bed  Physical Exam  Constitutional: She is oriented to person, place, and time and well-developed, well-nourished, and in no distress. No distress.  HENT:  Head: Normocephalic and atraumatic.  Mouth/Throat: Oropharynx is clear and moist. No oropharyngeal exudate.  Eyes: Pupils are equal, round, and reactive to light. Conjunctivae and EOM are normal. No scleral icterus.  Neck: Normal range of motion. Neck supple. No thyromegaly present.  Cardiovascular: Normal rate, regular rhythm and normal heart sounds.  No murmur heard. Pulmonary/Chest: Effort normal and breath sounds normal. No respiratory distress. She has no wheezes. She has no rales.  Abdominal: Soft. Bowel sounds are normal. She exhibits no distension and no mass. There is no tenderness. There is no rebound and no guarding.  Musculoskeletal: Normal range of motion. She exhibits no edema.  Lymphadenopathy:    She has no cervical adenopathy.  Neurological: She is alert and oriented to person, place, and time. She has normal reflexes. No cranial nerve deficit.  Skin: Skin is warm and dry. No rash noted. She is not diaphoretic. No erythema.     LABORATORY DATA: I have personally reviewed the data as listed: Appointment on 02/27/2018  Component Date Value Ref Range Status  . WBC Count 02/27/2018 4.2  3.9 - 10.3 K/uL Final  . RBC 02/27/2018 2.75* 3.70 - 5.45 MIL/uL Final  . Hemoglobin 02/27/2018 9.2* 11.6 - 15.9 g/dL Final  .  HCT 02/27/2018 28.2* 34.8 - 46.6 % Final  . MCV 02/27/2018 102.5* 79.5 - 101.0 fL Final  . MCH 02/27/2018 33.5  25.1 - 34.0 pg Final  . MCHC 02/27/2018 32.6  31.5 - 36.0 g/dL Final  . RDW 02/27/2018 26.5* 11.2 - 14.5 % Final  . Platelet Count 02/27/2018 237  145 - 400 K/uL Final  . Neutrophils Relative % 02/27/2018 65  % Final  . Neutro Abs 02/27/2018 2.8  1.5 - 6.5 K/uL Final  . Lymphocytes Relative 02/27/2018 24  % Final  . Lymphs Abs 02/27/2018 1.0  0.9 - 3.3 K/uL Final  . Monocytes Relative 02/27/2018 10  % Final  . Monocytes Absolute 02/27/2018 0.4  0.1 - 0.9 K/uL Final  . Eosinophils Relative 02/27/2018 0  % Final  . Eosinophils Absolute 02/27/2018 0.0  0.0 - 0.5 K/uL Final  . Basophils Relative 02/27/2018 1  % Final  . Basophils Absolute 02/27/2018 0.0  0.0 - 0.1 K/uL Final   Performed at [facility], [place] [address] [place], [place] [address]  . Sodium 02/27/2018 141  136 - 145 mmol/L Final  . Potassium 02/27/2018 4.9  3.5 - 5.1 mmol/L Final  . Chloride 02/27/2018 105  98 - 109 mmol/L Final  . CO2 02/27/2018 27  22 - 29 mmol/L Final  . Glucose, Bld 02/27/2018 124  70 - 140 mg/dL Final  . BUN 02/27/2018 24  7 - 26 mg/dL Final  . Creatinine 02/27/2018 0.95  0.60 - 1.10 mg/dL Final  . Calcium 02/27/2018 10.0  8.4 - 10.4 mg/dL Final  . Total Protein 02/27/2018 7.2  6.4 - 8.3 g/dL Final  . Albumin 02/27/2018 3.8  3.5 - 5.0 g/dL Final  . AST 02/27/2018 16  5 - 34 U/L Final  . ALT 02/27/2018 20  0 - 55 U/L Final  . Alkaline Phosphatase 02/27/2018 66  40 -  150 U/L Final  . Total Bilirubin 02/27/2018 0.6  0.2 - 1.2 mg/dL Final  . GFR, Est Non Af Am 02/27/2018 53* >60 mL/min Final  . GFR, Est AFR Am 02/27/2018 >60  >60 mL/min Final   Comment: (NOTE) The eGFR has been calculated using the CKD EPI equation. This calculation has not been validated in all clinical situations. eGFR's persistently <60 mL/min signify possible Chronic Kidney Disease.   Georgiann Hahn  gap 02/27/2018 9  3 - 11 Final   Performed at [facility], [place] [address] [place], [place] [address]  . LDH 02/27/2018 260* 125 - 245 U/L Final   Performed at [facility], [place] [address], [place], [place] [address]  . Blood Bank Specimen 02/27/2018 SAMPLE AVAILABLE FOR TESTING   Final  . Sample Expiration 02/27/2018    Final                   Value:03/02/2018 Performed at [facility], [place] [address] [place],  [address]   Office Visit on 02/25/2018  Component Date Value Ref Range Status  . TSH 02/25/2018 0.84  0.40 - 4.50 mIU/L Final       Ardath Sax, MD

## 2018-03-22 NOTE — Assessment & Plan Note (Signed)
84 y.o. female with del 5q MDS diagnosis, currently managed supportive care risk over initiation of lenalidomide therapy based on the previous history of DVT, peptic ulcer disease, and GI bleeding in addition to multiple other comorbidities including pulmonary fibrosis, congestive heart failure, and atrial fibrillation. Patient underwent appropriately usual evaluation locally confirming presence of myelodysplastic syndrome. Our additional evaluation demonstrated reasonably high erythropoietin level making supplementation unlikely to be helpful in relieving anemia.  At the present time, patient is requiring infrequent transfusions.  Current average is approximately 2 units/month.  Patient tolerated transfusions well, but does have positive anti-K & anti-V antibodies making crossmatching slightly more difficult.  Previously, we have discussed potential initiation of iron chelation with the patient due to repeat transfusions leading to the iron overload.  Ultimately, patient expresses no desire for initiation of such therapy due to potential risks.  She is accepting of the potential complications of iron overload such as arthritis, diabetes, liver injury, and potential development of heart failure.  At this time, patient prefers to continue symptom-directed management with supportive care.  Plan: --Continue labs QoWk based on transfusion requirements over the past 3 months.  No further testing for iron burden as the patient is not likely to accept iron chelation therapy in the future. --Continue transfusion support: 1U pRBCs for Hgb <=9.0g/dL, all products leukoreduced and irradiated; -Initiate ondansetron for nausea control. --Return to clinic in 3 months with lab work and possible transfusion support

## 2018-03-23 ENCOUNTER — Other Ambulatory Visit: Payer: Self-pay | Admitting: Internal Medicine

## 2018-03-27 ENCOUNTER — Inpatient Hospital Stay: Payer: Medicare Other

## 2018-03-27 ENCOUNTER — Other Ambulatory Visit: Payer: Self-pay

## 2018-03-27 ENCOUNTER — Telehealth: Payer: Self-pay | Admitting: Hematology and Oncology

## 2018-03-27 ENCOUNTER — Telehealth: Payer: Self-pay | Admitting: *Deleted

## 2018-03-27 DIAGNOSIS — D46C Myelodysplastic syndrome with isolated del(5q) chromosomal abnormality: Secondary | ICD-10-CM | POA: Diagnosis not present

## 2018-03-27 DIAGNOSIS — D619 Aplastic anemia, unspecified: Secondary | ICD-10-CM

## 2018-03-27 DIAGNOSIS — D649 Anemia, unspecified: Secondary | ICD-10-CM

## 2018-03-27 DIAGNOSIS — D638 Anemia in other chronic diseases classified elsewhere: Secondary | ICD-10-CM | POA: Diagnosis not present

## 2018-03-27 DIAGNOSIS — K921 Melena: Secondary | ICD-10-CM

## 2018-03-27 LAB — CBC WITH DIFFERENTIAL (CANCER CENTER ONLY)
Basophils Absolute: 0.1 10*3/uL (ref 0.0–0.1)
Basophils Relative: 2 %
EOS ABS: 0 10*3/uL (ref 0.0–0.5)
EOS PCT: 0 %
HCT: 23.1 % — ABNORMAL LOW (ref 34.8–46.6)
HEMOGLOBIN: 7.5 g/dL — AB (ref 11.6–15.9)
Lymphocytes Relative: 31 %
Lymphs Abs: 1 10*3/uL (ref 0.9–3.3)
MCH: 34.1 pg — ABNORMAL HIGH (ref 25.1–34.0)
MCHC: 32.4 g/dL (ref 31.5–36.0)
MCV: 105 fL — ABNORMAL HIGH (ref 79.5–101.0)
MONOS PCT: 12 %
Monocytes Absolute: 0.4 10*3/uL (ref 0.1–0.9)
NEUTROS PCT: 55 %
Neutro Abs: 1.8 10*3/uL (ref 1.5–6.5)
Platelet Count: 209 10*3/uL (ref 145–400)
RBC: 2.2 MIL/uL — ABNORMAL LOW (ref 3.70–5.45)
RDW: 30.1 % — ABNORMAL HIGH (ref 11.2–14.5)
WBC Count: 3.2 10*3/uL — ABNORMAL LOW (ref 3.9–10.3)

## 2018-03-27 LAB — RETICULOCYTES
RBC.: 2.19 MIL/uL — ABNORMAL LOW (ref 3.70–5.45)
RETIC CT PCT: 2.1 % (ref 0.7–2.1)
Retic Count, Absolute: 46 10*3/uL (ref 33.7–90.7)

## 2018-03-27 LAB — PREPARE RBC (CROSSMATCH)

## 2018-03-27 MED ORDER — SODIUM CHLORIDE 0.9 % IV SOLN
250.0000 mL | Freq: Once | INTRAVENOUS | Status: AC
  Administered 2018-03-27: 250 mL via INTRAVENOUS

## 2018-03-27 MED ORDER — ACETAMINOPHEN 325 MG PO TABS
ORAL_TABLET | ORAL | Status: AC
  Filled 2018-03-27: qty 2

## 2018-03-27 MED ORDER — ACETAMINOPHEN 325 MG PO TABS
650.0000 mg | ORAL_TABLET | Freq: Once | ORAL | Status: AC
  Administered 2018-03-27: 650 mg via ORAL

## 2018-03-27 MED ORDER — DIPHENHYDRAMINE HCL 25 MG PO CAPS
25.0000 mg | ORAL_CAPSULE | Freq: Once | ORAL | Status: AC
  Administered 2018-03-27: 25 mg via ORAL

## 2018-03-27 MED ORDER — DIPHENHYDRAMINE HCL 25 MG PO CAPS
ORAL_CAPSULE | ORAL | Status: AC
  Filled 2018-03-27: qty 1

## 2018-03-27 NOTE — Patient Instructions (Signed)

## 2018-03-27 NOTE — Telephone Encounter (Signed)
Pt in infusion - taking updated calendar with new appointments.

## 2018-03-28 LAB — TYPE AND SCREEN
ABO/RH(D): B POS
Antibody Screen: POSITIVE
DONOR AG TYPE: NEGATIVE
UNIT DIVISION: 0

## 2018-03-28 LAB — BPAM RBC
Blood Product Expiration Date: 201906132359
ISSUE DATE / TIME: 201905241403
Unit Type and Rh: 5100

## 2018-04-03 ENCOUNTER — Encounter (HOSPITAL_COMMUNITY): Payer: Self-pay | Admitting: Emergency Medicine

## 2018-04-03 ENCOUNTER — Telehealth: Payer: Self-pay | Admitting: *Deleted

## 2018-04-03 ENCOUNTER — Ambulatory Visit (HOSPITAL_COMMUNITY)
Admission: EM | Admit: 2018-04-03 | Discharge: 2018-04-03 | Disposition: A | Discharge status: Home / Self Care | Payer: Medicare Other | Attending: Family Medicine | Admitting: Family Medicine

## 2018-04-03 DIAGNOSIS — W57XXXA Bitten or stung by nonvenomous insect and other nonvenomous arthropods, initial encounter: Secondary | ICD-10-CM

## 2018-04-03 DIAGNOSIS — S30860A Insect bite (nonvenomous) of lower back and pelvis, initial encounter: Secondary | ICD-10-CM | POA: Diagnosis not present

## 2018-04-03 MED ORDER — DOXYCYCLINE HYCLATE 100 MG PO CAPS: 100.0000 mg | ORAL_CAPSULE | Freq: Two times a day (BID) | ORAL | 0 refills | Status: AC

## 2018-04-03 NOTE — ED Notes (Signed)
Pt discharged by provider.

## 2018-04-03 NOTE — Discharge Instructions (Signed)
Take 21 days of doxycycline Take with food Watch for stomach upset or nausea Follow up with your PCP

## 2018-04-03 NOTE — ED Triage Notes (Signed)
Pt noticed bullseye rash on back 1 hour pta.

## 2018-04-03 NOTE — ED Provider Notes (Signed)
[facility]    CSN: [id number] Arrival date & time: 04/03/18  1646     History   Chief Complaint Chief Complaint  Patient presents with  . Rash    HPI Kimberly Horn is a 84 y.o. female.   HPI  Patient is here with her son, daughter-in-law, and granddaughter to be evaluated for a rash.  She just spent a week in [place] with her sister.  She states that it was an uneventful visit.  No known insect or tick bites.  She just got home and was taking a shower.  She lifted her back in the mirror and saw a big red rash.  She told her family, and they brought her in for evaluation. No cough cold or runny nose.  No fever or malaise.  No joint pain.  The rash does not hurt or itch. She has multiple medical problems.  Her problem list and medication list are reviewed and reconciled.  Past Medical History:  Diagnosis Date  . Anxiety   . Atrial fibrillation ([place])   . Benign neoplasm of brain ([place])   . CHF (congestive heart failure) ([place])   . Difficulty hearing   . Diverticulitis   . DVT (deep venous thrombosis) ([place])   . Esophageal reflux   . Flu 11/04/2014  . GI bleed   . Hyperlipidemia   . Hypothyroid   . Hypothyroidism   . Low blood pressure    when hbg is bekow 9  . Myelodysplastic syndrome with 5q deletion ([place])   . Osteoarthritis 10/09/2010  . Osteoporosis 10/09/2010  . Pacemaker   . Peptic ulcer    with GERD  . Peptic ulcer   . Pulmonary embolism ([place])   . Pulmonary fibrosis [facility])     Patient Active Problem List   Diagnosis Date Noted  . Diarrhea 02/25/2018  . 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 Hypothyroidism (acquired) 07/16/2017  . GERD (gastroesophageal reflux disease) 07/16/2017  . Chronic pain syndrome 07/16/2017  . Insomnia 07/16/2017  . Idiopathic pulmonary fibrosis ([place]) 07/16/2017  . Congestive heart failure (CHF) ([place]) 07/16/2017  . MDS (myelodysplastic syndrome) with 5q deletion ([place]) 07/16/2017  . Anemia due  to bone marrow failure ([place]) 07/16/2017  . Age-related osteoporosis without current pathological fracture 07/16/2017  . History of pulmonary embolus (PE) 07/16/2017  . Colon cancer screening 07/16/2017  . Syncope 12/21/2016  . Gastrointestinal hemorrhage with melena 12/21/2016  . Acute blood loss anemia 12/21/2016  . Atrial fibrillation ([place]) 12/21/2016  . Chronic anticoagulation 12/21/2016  . Peptic ulcer disease 12/21/2016    Past Surgical History:  Procedure Laterality Date  . ABLATION    . APPENDECTOMY    . BACK SURGERY    . BILATERAL CARPAL TUNNEL RELEASE    . CATARACT EXTRACTION, BILATERAL    . CHOLECYSTECTOMY    . ESOPHAGOGASTRODUODENOSCOPY (EGD) WITH PROPOFOL N/A 12/22/2016   Procedure: ESOPHAGOGASTRODUODENOSCOPY (EGD) WITH PROPOFOL;  Surgeon: Mauri Pole, MD;  Location: [facility];  Service: Endoscopy;  Laterality: N/A;  . HERNIA REPAIR    . PACEMAKER INSERTION    . TONSILLECTOMY    . TUBAL LIGATION Bilateral     OB History   None      Home Medications    Prior to Admission medications   Medication Sig Start Date End Date Taking? Authorizing Provider  Calcium-Vitamin D-Vitamin K (VIACTIV PO) Take by mouth. Plus D and K 2 by mouth at lunch    [provider]  Cholecalciferol (VITAMIN D3) 5000 units CAPS Take 1 capsule by mouth daily.    [provider]  DULoxetine (CYMBALTA) 30 MG capsule TAKE 1 CAPSULE(30 MG) BY MOUTH TWICE DAILY 03/02/18   Gildardo Cranker, DO  furosemide (LASIX) 20 MG tablet Take 20 mg by mouth as needed.     [provider]  HYDROcodone-acetaminophen (NORCO/VICODIN) 5-325 MG tablet Take 1 tablet by mouth at bedtime. 03/13/18   Gildardo Cranker, DO  levothyroxine (SYNTHROID, LEVOTHROID) 100 MCG tablet Take 1 tablet (100 mcg total) by mouth daily. In the morning 02/25/18   Gildardo Cranker, DO  Melatonin 10 MG CAPS Take 1 tablet by mouth daily.    [provider]  montelukast (SINGULAIR) 10 MG tablet Take 10 mg  by mouth daily. In the morning    [provider]  Multiple Vitamins-Minerals (CENTRUM SILVER ULTRA MENS PO) Take by mouth daily.    [provider]  omeprazole (PRILOSEC) 20 MG capsule TAKE 1 CAPSULE(20 MG) BY MOUTH TWICE DAILY BEFORE A MEAL 03/23/18   Gildardo Cranker, DO  OXYGEN Inhale 3-4 L into the lungs daily.    [provider]  potassium chloride SA (K-DUR,KLOR-CON) 20 MEQ tablet TAKE 1 TABLET BY MOUTH DAILY AS NEEDED WHEN TAKING FUROSEMIDE 12/08/17   Gildardo Cranker, DO  prochlorperazine (COMPAZINE) 10 MG tablet Take 1 tablet (10 mg total) by mouth every 6 (six) hours as needed for nausea or vomiting. 02/27/18   Ardath Sax, MD  sucralfate (CARAFATE) 1 GM/10ML suspension Take 10 mLs (1 g total) by mouth 2 (two) times daily. 02/25/18   Gildardo Cranker, DO    Family History Family History  Problem Relation Age of Onset  . Heart attack Mother   . Cancer Father        throat  . Arthritis Sister   . Lung disease Brother   . Heart disease Brother   . Cancer Brother   . Gout Brother   . Arthritis Brother   . Stroke Son   . Diabetes Son     Social History Social History   Tobacco Use  . Smoking status: Never Smoker  . Smokeless tobacco: Never Used  Substance Use Topics  . Alcohol use: No  . Drug use: No     Allergies   Demerol [meperidine]; Hydroxyzine; and Talwin [pentazocine]   Review of Systems Review of Systems  Constitutional: Negative for chills and fever.  HENT: Positive for hearing loss. Negative for ear pain and sore throat.        Partially corrected with hearing aids  Eyes: Negative for pain and visual disturbance.  Respiratory: Positive for shortness of breath. Negative for cough.        Chronic, COPD, on oxygen  Cardiovascular: Negative for chest pain and palpitations.  Gastrointestinal: Negative for abdominal pain and vomiting.  Genitourinary: Negative for dysuria and hematuria.  Musculoskeletal: Negative for arthralgias, back  pain and myalgias.  Skin: Positive for rash. Negative for color change.  Neurological: Negative for seizures, syncope, weakness and headaches.  All other systems reviewed and are negative.    Physical Exam Triage Vital Signs ED Triage Vitals [04/03/18 1714]  Enc Vitals Group     BP 116/71     Pulse Rate (!) 108     Resp 18     Temp 98.4 F (36.9 C)     Temp src      SpO2 100 %     Weight      Height  Head Circumference      Peak Flow      Pain Score      Pain Loc      Pain Edu?      Excl. in [place]?    No data found.  Updated Vital Signs BP 116/71   Pulse (!) 108   Temp 98.4 F (36.9 C)   Resp 18   SpO2 100%   Visual Acuity Right Eye Distance:   Left Eye Distance:   Bilateral Distance:    Right Eye Near:   Left Eye Near:    Bilateral Near:     Physical Exam  Constitutional: She appears well-developed and well-nourished. No distress.  HENT:  Head: Normocephalic and atraumatic.  Mouth/Throat: Oropharynx is clear and moist.  Quite hard on hering  Eyes: Pupils are equal, round, and reactive to light. Conjunctivae are normal.  Neck: Normal range of motion.  Cardiovascular: Normal rate.  Pulmonary/Chest: Effort normal. No respiratory distress.  Decreased breath sounds  Abdominal: Soft. She exhibits no distension.  No axillary adenopathy, cervical  Musculoskeletal: Normal range of motion. She exhibits no edema.  Arthritis hands  Neurological: She is alert.  Skin: Skin is warm and dry. Rash noted.  See pic       UC Treatments / Results  Labs (all labs ordered are listed, but only abnormal results are displayed) Labs Reviewed - No data to display  EKG None  Radiology No results found.  Procedures Procedures (including critical care time)  Medications Ordered in UC Medications - No data to display  Initial Impression / Assessment and Plan / UC Course  I have reviewed the triage vital signs and the nursing notes.  Pertinent labs & imaging  results that were available during my care of the patient were reviewed by me and considered in my medical decision making (see chart for details).    Bulls Eye rash typical for early Lyme disease.  Discussed with patient and family. Final Clinical Impressions(s) / UC Diagnoses   Final diagnoses:  None   Discharge Instructions   None    ED Prescriptions    None     Controlled Substance Prescriptions [place] Controlled Substance Registry consulted? Not Applicable   Raylene Everts, MD 04/03/18 1949

## 2018-04-03 NOTE — Telephone Encounter (Signed)
Kimberly Horn, caregiver called and stated that patient got out of shower and she has a Bull's Eye Rash. Thinking she was bitten by a tick. Advised to take to Urgent Care to be evaluated. Agreed.

## 2018-04-09 ENCOUNTER — Other Ambulatory Visit: Payer: Self-pay | Admitting: *Deleted

## 2018-04-09 ENCOUNTER — Other Ambulatory Visit: Payer: Self-pay

## 2018-04-09 ENCOUNTER — Telehealth: Payer: Self-pay

## 2018-04-09 ENCOUNTER — Inpatient Hospital Stay: Payer: Medicare Other

## 2018-04-09 ENCOUNTER — Inpatient Hospital Stay: Payer: Medicare Other | Attending: Hematology and Oncology

## 2018-04-09 DIAGNOSIS — D46C Myelodysplastic syndrome with isolated del(5q) chromosomal abnormality: Secondary | ICD-10-CM

## 2018-04-09 DIAGNOSIS — D619 Aplastic anemia, unspecified: Secondary | ICD-10-CM

## 2018-04-09 DIAGNOSIS — I4891 Unspecified atrial fibrillation: Secondary | ICD-10-CM | POA: Diagnosis not present

## 2018-04-09 DIAGNOSIS — K921 Melena: Secondary | ICD-10-CM

## 2018-04-09 DIAGNOSIS — Z86718 Personal history of other venous thrombosis and embolism: Secondary | ICD-10-CM | POA: Insufficient documentation

## 2018-04-09 LAB — IRON AND TIBC
Iron: 215 ug/dL — ABNORMAL HIGH (ref 41–142)
SATURATION RATIOS: 99 % — AB (ref 21–57)
TIBC: 216 ug/dL — ABNORMAL LOW (ref 236–444)
UIBC: 2 ug/dL

## 2018-04-09 LAB — CBC WITH DIFFERENTIAL/PLATELET
Basophils Absolute: 0.1 10*3/uL (ref 0.0–0.1)
Basophils Relative: 2 %
EOS ABS: 0 10*3/uL (ref 0.0–0.5)
Eosinophils Relative: 0 %
HCT: 23.9 % — ABNORMAL LOW (ref 34.8–46.6)
Hemoglobin: 7.6 g/dL — ABNORMAL LOW (ref 11.6–15.9)
LYMPHS ABS: 1 10*3/uL (ref 0.9–3.3)
Lymphocytes Relative: 36 %
MCH: 33.5 pg (ref 25.1–34.0)
MCHC: 31.8 g/dL (ref 31.5–36.0)
MCV: 105.3 fL — ABNORMAL HIGH (ref 79.5–101.0)
MONOS PCT: 12 %
Monocytes Absolute: 0.3 10*3/uL (ref 0.1–0.9)
Neutro Abs: 1.4 10*3/uL — ABNORMAL LOW (ref 1.5–6.5)
Neutrophils Relative %: 50 %
PLATELETS: 285 10*3/uL (ref 145–400)
RBC: 2.27 MIL/uL — AB (ref 3.70–5.45)
RDW: 27 % — AB (ref 11.2–14.5)
WBC: 2.7 10*3/uL — AB (ref 3.9–10.3)

## 2018-04-09 LAB — CMP (CANCER CENTER ONLY)
ALBUMIN: 3.6 g/dL (ref 3.5–5.0)
ALT: 59 U/L — ABNORMAL HIGH (ref 0–55)
ANION GAP: 9 (ref 3–11)
AST: 15 U/L (ref 5–34)
Alkaline Phosphatase: 90 U/L (ref 40–150)
BUN: 21 mg/dL (ref 7–26)
CO2: 26 mmol/L (ref 22–29)
Calcium: 9 mg/dL (ref 8.4–10.4)
Chloride: 108 mmol/L (ref 98–109)
Creatinine: 0.88 mg/dL (ref 0.60–1.10)
GFR, Est AFR Am: >60 mL/min (ref >60)
GFR, Estimated: 58 mL/min — ABNORMAL LOW (ref >60)
GLUCOSE: 93 mg/dL (ref 70–140)
POTASSIUM: 4 mmol/L (ref 3.5–5.1)
SODIUM: 143 mmol/L (ref 136–145)
Total Bilirubin: 0.4 mg/dL (ref 0.2–1.2)
Total Protein: 6.6 g/dL (ref 6.4–8.3)

## 2018-04-09 LAB — RETICULOCYTES
RBC.: 2.27 MIL/uL — ABNORMAL LOW (ref 3.70–5.45)
RETIC CT PCT: 1.8 % (ref 0.7–2.1)
Retic Count, Absolute: 40.9 10*3/uL (ref 33.7–90.7)

## 2018-04-09 LAB — PREPARE RBC (CROSSMATCH)

## 2018-04-09 LAB — LACTATE DEHYDROGENASE: LDH: 268 U/L — ABNORMAL HIGH (ref 125–245)

## 2018-04-09 LAB — FERRITIN: Ferritin: 1417 ng/mL — ABNORMAL HIGH (ref 9–269)

## 2018-04-09 MED ORDER — ACETAMINOPHEN 325 MG PO TABS
ORAL_TABLET | ORAL | Status: AC
  Filled 2018-04-09: qty 2

## 2018-04-09 MED ORDER — DIPHENHYDRAMINE HCL 25 MG PO CAPS
25.0000 mg | ORAL_CAPSULE | Freq: Once | ORAL | Status: AC
  Administered 2018-04-09: 25 mg via ORAL

## 2018-04-09 MED ORDER — DIPHENHYDRAMINE HCL 25 MG PO CAPS
ORAL_CAPSULE | ORAL | Status: AC
  Filled 2018-04-09: qty 1

## 2018-04-09 MED ORDER — ACETAMINOPHEN 325 MG PO TABS
650.0000 mg | ORAL_TABLET | Freq: Once | ORAL | Status: AC
  Administered 2018-04-09: 650 mg via ORAL

## 2018-04-09 MED ORDER — SODIUM CHLORIDE 0.9 % IV SOLN
250.0000 mL | Freq: Once | INTRAVENOUS | Status: AC
  Administered 2018-04-09: 250 mL via INTRAVENOUS

## 2018-04-09 NOTE — Patient Instructions (Signed)
Blood Transfusion A blood transfusion is a procedure in which you are given blood through an IV tube. You may need this procedure because of:  Illness.  Surgery.  Injury.  The blood may come from someone else (a donor). You may also be able to donate blood for yourself (autologous blood donation). The blood given in a transfusion is made up of different types of cells. You may get:  Red blood cells. These carry oxygen to the cells in the body.  White blood cells. These help you fight infections.  Platelets. These help your blood to clot.  Plasma. This is the liquid part of your blood. It helps with fluid imbalances.  If you have a clotting disorder, you may also get other types of blood products. What happens before the procedure?  You will have a blood test to find out your blood type. The test also finds out what type of blood your body will accept and matches it to the donor type.  If you are going to have a planned surgery, you may be able to donate your own blood. This may be done in case you need a transfusion.  If you have had an allergic reaction to a transfusion in the past, you may be given medicine to help prevent a reaction. This medicine may be given to you by mouth or through an IV.  You will have your temperature, blood pressure, and pulse checked.  Follow instructions from your doctor about what you cannot eat or drink.  Ask your doctor about: ? Changing or stopping your regular medicines. This is important if you take diabetes medicines or blood thinners. ? Taking medicines such as aspirin and ibuprofen. These medicines can thin your blood. Do not take these medicines before your procedure if your doctor tells you not to. What happens during the procedure?  An IV tube will be put into one of your veins.  The bag of donated blood will be attached to your IV tube. Then, the blood will enter through your vein.  Your temperature, blood pressure, and pulse will be  checked regularly during the procedure. This is done to find early signs of a transfusion reaction.  If you have any signs or symptoms of a reaction, your transfusion will be stopped. You may also be given medicine.  When the transfusion is done, your IV tube will be taken out.  Pressure may be applied to the IV site for a few minutes.  A bandage (dressing) will be put on the IV site. The procedure may vary among doctors and hospitals. What happens after the procedure?  Your temperature, blood pressure, heart rate, breathing rate, and blood oxygen level will be checked often.  Your blood may be tested to see how you are responding to the transfusion.  You may be warmed with fluids or blankets. This is done to keep the temperature of your body normal. Summary  A blood transfusion is a procedure in which you are given blood through an IV tube.  The blood may come from someone else (a donor). You may also be able to donate blood for yourself.  If you have had an allergic reaction to a transfusion in the past, you may be given medicine to help prevent a reaction. This medicine may be given to you by mouth or through an IV tube.  Your temperature, blood pressure, heart rate, breathing rate, and blood oxygen level will be checked often.  Your blood may be tested to   see how you are responding to the transfusion. This information is not intended to replace advice given to you by your health care provider. Make sure you discuss any questions you have with your health care provider. Document Released: 01/17/2009 Document Revised: 06/14/2016 Document Reviewed: 06/14/2016 Elsevier Interactive Patient Education  2017 [employer] Inc.  

## 2018-04-09 NOTE — Telephone Encounter (Signed)
Called Kimberly Horn to let her know that the lab neglected to draw her Type and Cross and she has many antibodies that take time to process.  Her Hgb is 7.9 and I relayed that message as well.  [name]

## 2018-04-09 NOTE — Progress Notes (Signed)
Sample to Blood Bank retrieved at original lab draw appt today.  Family members confirmed patient has blue blood bracelet on.  Per Irene Limbo ok to order 1 unit of blood to be infused for today.  Irene Limbo needs to add future orders and needs to see the patient before her next appt.  Scheduling message sent.

## 2018-04-10 ENCOUNTER — Telehealth: Payer: Self-pay

## 2018-04-10 LAB — TYPE AND SCREEN
ABO/RH(D): B POS
ANTIBODY SCREEN: POSITIVE
Donor AG Type: NEGATIVE
Unit division: 0

## 2018-04-10 LAB — BPAM RBC
BLOOD PRODUCT EXPIRATION DATE: 201906202359
ISSUE DATE / TIME: 201906061406
Unit Type and Rh: 7300

## 2018-04-10 NOTE — Telephone Encounter (Signed)
Spoke with daughter in law concerning new changes to the upcoming schedule. Per 6/6 sch msg.

## 2018-04-10 NOTE — Telephone Encounter (Signed)
Dr. Irene Limbo is aware and okayed the 10 min over book for NEW PATIENT 30 min. Per 6/6 sch-msg

## 2018-04-13 ENCOUNTER — Other Ambulatory Visit: Payer: Self-pay | Admitting: *Deleted

## 2018-04-13 DIAGNOSIS — G894 Chronic pain syndrome: Secondary | ICD-10-CM

## 2018-04-13 NOTE — Telephone Encounter (Signed)
Refill Requested [place] Database Verified.  LR: 03/13/2018 Pharmacy Confirmed Pended Rx and sent to Dr. Eulas Post for approval.

## 2018-04-14 MED ORDER — HYDROCODONE-ACETAMINOPHEN 5-325 MG PO TABS: 1.0000 | ORAL_TABLET | Freq: Every day | ORAL | 0 refills | Status: DC

## 2018-04-17 ENCOUNTER — Telehealth: Payer: Self-pay

## 2018-04-17 NOTE — Telephone Encounter (Signed)
1115-The switchboard transferred Cameron Sprang to me who cares for her mother in law, Stormy Connon.  Tashara was a former patient of Dr. Lebron Conners and the care has been transferred to Dr. Irene Limbo.  The new appointments that were scheduled were wrong due to the fact that Enrica has a long type and cross time in the blood bank due to antibodies in her blood.  I changed all the appointments to reflect accurately for her "time" to process the type and cross.  Also, the last time she came for blood on 04/09/2018, there was no order for a type and cross but her daughter advocated for her and "made" lab draw it without the order.  Lab then called to get the order.  She normally comes in at 0900 for lab, leaves to go home and then comes back at 1300 for her blood product in infusion so that she does not have to wait in the lobby for a long period of time.  Lamona Curl also inquired as to why do they need the 7/19 appointment if she is being seen on 6/21 to establish care with Dr. Irene Limbo. She only saw Dr. Lebron Conners every three months.  I told her to ask Dr. Irene Limbo that question at the 6/21 appointment.  She said she would and was happy when I got off the phone with her after I had corrected all the appointment times.  I am copying Audie Clear in scheduling, Erasmo Downer and Lanelle Bal who both support Dr. Irene Limbo on the nursing side, and Dr. Irene Limbo on this phone note.   Thank you for taking notes on this patient and her care. [name]

## 2018-04-21 ENCOUNTER — Other Ambulatory Visit: Payer: Self-pay | Admitting: Hematology

## 2018-04-21 DIAGNOSIS — D46C Myelodysplastic syndrome with isolated del(5q) chromosomal abnormality: Secondary | ICD-10-CM

## 2018-04-23 NOTE — Progress Notes (Signed)
HEMATOLOGY/ONCOLOGY CONSULTATION NOTE  Date of Service: 04/24/2018  Patient Care Team: Gildardo Cranker, DO as PCP - General (Internal Medicine) P[facility], [facility] as Audiologist (Audiology)  CHIEF COMPLAINTS/PURPOSE OF CONSULTATION:  Myelodysplastic syndrome - transfer and continuity of care  HISTORY OF PRESENTING ILLNESS:   Kimberly Horn is a wonderful 84 y.o. female who has been previously followed by my colleague Dr Grace Isaac for evaluation and management of Myelodysplastic syndrome with 5q deletion. She is accompanied today by her daughter-in-law. The pt reports that she is doing well overall. Her PCP is Dr Tyler Aas. Her cardiologist is Dr Remus Blake. The pt retains her power of attorney in regards to her medical care.   The pt had a BM Bx on 06/07/17 which revealed hypercellular BM with 40% cellularity with megakaryocytic dysplasia and increased ring sideroblasts. 06/07/17 FISH revealed a 5q deletion. She has not pursued initiation of Lenalidomide therapy, and has thus far received symptom-directed management with supportive care. She has developed anti-K and anti-V antibodies and is aware of the complications of iron overload.     The pt presents in a wheelchair today and is on oxygen. She notes that she normally does not need to use a wheelchair. Her daughter-in-law notes that she had a PE a year ago, and was taken off of Eliquis about 7-8 months ago, due to a primary concern for her low RBCs. In January and February of 2018, she was fainting and was found to have a bleeding leak in her esophagus, which was subsequently stopped with surgery. She did not receive a colonoscopy and did receive an endoscopy at that time. The pt adds that she has had inner-ear related problems, inducing vertigo.  She has been receiving blood transfusions about every other week. The patient's daughter in law believes that the pt has received Aranesp shots in the past, and the pt is uncertain  about this.  Most recent lab results (04/24/18) of CBC  is as follows: all values are WNL except for WBC at 2.9k, RBC at 2.31, HGB at 7.7, HCT at 23.8, MCV at 103.0, RDW at 26.7, ANC at 1.4k. Ferritin 04/24/18 is 1806 Review of Erythropoietin on 07/30/17 was elevated at 457.1.  On review of systems, pt reports vertigo-related light headedness, good energy levels, and denies fatigue, dizziness, leg swelling, and any other symptoms.   MEDICAL HISTORY:  Past Medical History:  Diagnosis Date  . Anxiety   . Atrial fibrillation ([place])   . Benign neoplasm of brain ([place])   . CHF (congestive heart failure) ([place])   . Difficulty hearing   . Diverticulitis   . DVT (deep venous thrombosis) ([place])   . Esophageal reflux   . Flu 11/04/2014  . GI bleed   . Hyperlipidemia   . Hypothyroid   . Hypothyroidism   . Low blood pressure    when hbg is bekow 9  . Myelodysplastic syndrome with 5q deletion ([place])   . Osteoarthritis 10/09/2010  . Osteoporosis 10/09/2010  . Pacemaker   . Peptic ulcer    with GERD  . Peptic ulcer   . Pulmonary embolism ([place])   . Pulmonary fibrosis ([place])     SURGICAL HISTORY: Past Surgical History:  Procedure Laterality Date  . ABLATION    . APPENDECTOMY    . BACK SURGERY    . BILATERAL CARPAL TUNNEL RELEASE    . CATARACT EXTRACTION, BILATERAL    . CHOLECYSTECTOMY    . ESOPHAGOGASTRODUODENOSCOPY (EGD) WITH PROPOFOL N/A  12/22/2016   Procedure: ESOPHAGOGASTRODUODENOSCOPY (EGD) WITH PROPOFOL;  Surgeon: Mauri Pole, MD;  Location: [facility];  Service: Endoscopy;  Laterality: N/A;  . HERNIA REPAIR    . PACEMAKER INSERTION    . TONSILLECTOMY    . TUBAL LIGATION Bilateral     SOCIAL HISTORY: Social History   Socioeconomic History  . Marital status: Widowed    Spouse name: Not on file  . Number of children: 5  . Years of education: Not on file  . Highest education level: Not on file  Occupational History  . Not on file  Social Needs  . Financial resource  strain: Not on file  . Food insecurity:    Worry: Not on file    Inability: Not on file  . Transportation needs:    Medical: Not on file    Non-medical: Not on file  Tobacco Use  . Smoking status: Never Smoker  . Smokeless tobacco: Never Used  Substance and Sexual Activity  . Alcohol use: No  . Drug use: No  . Sexual activity: Never  Lifestyle  . Physical activity:    Days per week: Not on file    Minutes per session: Not on file  . Stress: Not on file  Relationships  . Social connections:    Talks on phone: Not on file    Gets together: Not on file    Attends religious service: Not on file    Active member of club or organization: Not on file    Attends meetings of clubs or organizations: Not on file    Relationship status: Not on file  . Intimate partner violence:    Fear of current or ex partner: Not on file    Emotionally abused: Not on file    Physically abused: Not on file    Forced sexual activity: Not on file  Other Topics Concern  . Not on file  Social History Narrative   Social History      Diet? I eat what I want      Do you drink/eat things with caffeine? yes      Marital status?            widowed                        What year were you married? 1950      Do you live in a house, apartment, assisted living, condo, trailer, etc.? With family      Is it one or more stories? 2 stories but stay on 1st floor       How many persons live in your home?       Do you have any pets in your home? (please list) usually 0 (but currently 1 cat)      Highest level of education completed?       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         some                             Type & how often? When I think about it      Advanced Directives      Do you have a living will? no      Do you have a DNR form?     no  If not, do you want to discuss one? no      Do you have signed POA/HPOA for  forms?  no      Functional Status      Do you have difficulty bathing or dressing yourself?      Do you have difficulty preparing food or eating?       Do you have difficulty managing your medications?      Do you have difficulty managing your finances?      Do you have difficulty affording your medications?    FAMILY HISTORY: Family History  Problem Relation Age of Onset  . Heart attack Mother   . Cancer Father        throat  . Arthritis Sister   . Lung disease Brother   . Heart disease Brother   . Cancer Brother   . Gout Brother   . Arthritis Brother   . Stroke Son   . Diabetes Son     ALLERGIES:  is allergic to demerol [meperidine]; hydroxyzine; and talwin [pentazocine].  MEDICATIONS:  Current Outpatient Medications  Medication Sig Dispense Refill  . Calcium-Vitamin D-Vitamin K (VIACTIV PO) Take by mouth. Plus D and K 2 by mouth at lunch    . Cholecalciferol (VITAMIN D3) 5000 units CAPS Take 1 capsule by mouth daily.    Marland Kitchen doxycycline (VIBRAMYCIN) 100 MG capsule Take 1 capsule (100 mg total) by mouth 2 (two) times daily. 42 capsule 0  . DULoxetine (CYMBALTA) 30 MG capsule TAKE 1 CAPSULE(30 MG) BY MOUTH TWICE DAILY 60 capsule 3  . furosemide (LASIX) 20 MG tablet Take 20 mg by mouth as needed.     Marland Kitchen HYDROcodone-acetaminophen (NORCO/VICODIN) 5-325 MG tablet Take 1 tablet by mouth at bedtime. 30 tablet 0  . levothyroxine (SYNTHROID, LEVOTHROID) 100 MCG tablet Take 1 tablet (100 mcg total) by mouth daily. In the morning 90 tablet 3  . Melatonin 10 MG CAPS Take 1 tablet by mouth daily.    . montelukast (SINGULAIR) 10 MG tablet Take 10 mg by mouth daily. In the morning    . Multiple Vitamins-Minerals (CENTRUM SILVER ULTRA MENS PO) Take by mouth daily.    Marland Kitchen omeprazole (PRILOSEC) 20 MG capsule TAKE 1 CAPSULE(20 MG) BY MOUTH TWICE DAILY BEFORE A MEAL 180 capsule 0  . OXYGEN Inhale 3-4 L into the lungs daily.    . potassium chloride SA (K-DUR,KLOR-CON) 20 MEQ tablet TAKE 1  TABLET BY MOUTH DAILY AS NEEDED WHEN TAKING FUROSEMIDE 30 tablet 3  . prochlorperazine (COMPAZINE) 10 MG tablet Take 1 tablet (10 mg total) by mouth every 6 (six) hours as needed for nausea or vomiting. 30 tablet 0  . sucralfate (CARAFATE) 1 GM/10ML suspension Take 10 mLs (1 g total) by mouth 2 (two) times daily. 420 mL 11   No current facility-administered medications for this visit.    Facility-Administered Medications Ordered in Other Visits  Medication Dose Route Frequency Provider Last Rate Last Dose  . 0.9 %  sodium chloride infusion  250 mL Intravenous Once Perlov, Mikhail G, MD      . heparin lock flush 100 unit/mL  250 Units Intracatheter PRN Perlov, Mikhail G, MD      . heparin lock flush 100 unit/mL  500 Units Intracatheter Daily PRN Perlov, Marinell Blight, MD      . sodium chloride flush (NS) 0.9 % injection 10 mL  10 mL Intracatheter PRN Perlov, Marinell Blight, MD      . sodium chloride flush (NS)  0.9 % injection 10 mL  10 mL Intracatheter PRN Ardath Sax, MD        REVIEW OF SYSTEMS:    10 Point review of Systems was done is negative except as noted above.  PHYSICAL EXAMINATION: ECOG PERFORMANCE STATUS: 2 - Symptomatic, <50% confined to bed  . Vitals:   04/24/18 1037  BP: 106/88  Resp: 17  Temp: 98.4 F (36.9 C)  SpO2: 100%   Filed Weights   04/24/18 1037  Weight: 140 lb 12.8 oz (63.9 kg)   .Body mass index is 24.94 kg/m.  GENERAL:alert, in no acute distress and comfortable SKIN: no acute rashes, no significant lesions EYES: conjunctiva are pink and non-injected, sclera anicteric OROPHARYNX: MMM, no exudates, no oropharyngeal erythema or ulceration NECK: supple, no JVD LYMPH:  no palpable lymphadenopathy in the cervical, axillary or inguinal regions LUNGS: clear to auscultation b/l with normal respiratory effort HEART: regular rate & rhythm ABDOMEN:  normoactive bowel sounds , non tender, not distended. Extremity: no pedal edema PSYCH: alert & oriented x 3  with fluent speech NEURO: no focal motor/sensory deficits  LABORATORY DATA:  I have reviewed the data as listed  . CBC Latest Ref Rng & Units 04/24/2018 04/09/2018 03/27/2018  WBC 3.9 - 10.3 K/uL 2.9(L) 2.7(L) 3.2(L)  Hemoglobin 11.6 - 15.9 g/dL 7.7(L) 7.6(L) 7.5(L)  Hematocrit 34.8 - 46.6 % 23.8(L) 23.9(L) 23.1(L)  Platelets 145 - 400 K/uL 189 285 209    . CMP Latest Ref Rng & Units 04/24/2018 04/09/2018 02/27/2018  Glucose 70 - 140 mg/dL 85 93 124  BUN 7 - 26 mg/dL 20 21 24   Creatinine 0.60 - 1.10 mg/dL 0.83 0.88 0.95  Sodium 136 - 145 mmol/L 140 143 141  Potassium 3.5 - 5.1 mmol/L 4.2 4.0 4.9  Chloride 98 - 109 mmol/L 105 108 105  CO2 22 - 29 mmol/L 27 26 27   Calcium 8.4 - 10.4 mg/dL 9.0 9.0 10.0  Total Protein 6.4 - 8.3 g/dL 6.5 6.6 7.2  Total Bilirubin 0.2 - 1.2 mg/dL 0.6 0.4 0.6  Alkaline Phos 40 - 150 U/L 68 90 66  AST 5 - 34 U/L 15 15 16   ALT 0 - 55 U/L 13 59(H) 20   06/06/17 BM Bx:   06/06/17 FISH:    RADIOGRAPHIC STUDIES: I have personally reviewed the radiological images as listed and agreed with the findings in the report. No results found.  ASSESSMENT & PLAN:  84 y.o. female with  1. Myelodysplastic syndrome with 5q deletion PLAN  -Discussed patient's most recent labs from 04/24/18, HGB at 7.7, ANC at 1.4k -Review of 07/30/17 Erythropoietin was elevated at 457.1 -Pt has had a hx of blood clots, and bleeding, and has not tolerated blood thinners which is concerning for starting Revlimid -Pt has anti-K and anti-V antibodies, related to her frequent transfusions -Discussed concerns of repeating blood transfusions, iron overload and fluid status - pt understand this and would like to continue pursuing blood transfusions -Pt has several pro-clotting risk factors including Afib, history of blood clots, and increasing age. -Will transfuse for Hgb<8 -Will check labs every 2 weeks, with transfusions as needed  -patient and her daughter had multiple questions which were  answered in details.   Labs q2weeks PRBC transfusion 1-2 units q2weeks RTC with Dr Irene Limbo in 8 weeks   All of the patients questions were answered with apparent satisfaction. The patient knows to call the clinic with any problems, questions or concerns.  The toal time spent in  the appt was 32 minutes and more than 50% was on counseling and direct patient cares.Sullivan Lone MD [place] [facility] Hematology/Oncology Physician [facility]  (Office):       [phone] (Work cell):  [phone] (Fax):           [phone]  04/24/2018 11:20 AM  I, Baldwin [place], am acting as a Education administrator for Dr Irene Limbo.   .I have reviewed the above documentation for accuracy and completeness, and I agree with the above. Brunetta Genera MD

## 2018-04-24 ENCOUNTER — Telehealth: Payer: Self-pay | Admitting: Hematology

## 2018-04-24 ENCOUNTER — Inpatient Hospital Stay: Payer: Medicare Other

## 2018-04-24 ENCOUNTER — Encounter: Payer: Self-pay | Admitting: Hematology

## 2018-04-24 ENCOUNTER — Inpatient Hospital Stay (HOSPITAL_BASED_OUTPATIENT_CLINIC_OR_DEPARTMENT_OTHER): Payer: Medicare Other | Admitting: Hematology

## 2018-04-24 ENCOUNTER — Other Ambulatory Visit: Payer: Self-pay

## 2018-04-24 VITALS — BP 106/88 | Temp 98.4°F | Resp 17 | Ht 63.0 in | Wt 140.8 lb

## 2018-04-24 DIAGNOSIS — Z86718 Personal history of other venous thrombosis and embolism: Secondary | ICD-10-CM | POA: Diagnosis not present

## 2018-04-24 DIAGNOSIS — I4891 Unspecified atrial fibrillation: Secondary | ICD-10-CM

## 2018-04-24 DIAGNOSIS — D46C Myelodysplastic syndrome with isolated del(5q) chromosomal abnormality: Secondary | ICD-10-CM | POA: Diagnosis not present

## 2018-04-24 LAB — CBC WITH DIFFERENTIAL/PLATELET
Basophils Absolute: 0 10*3/uL (ref 0.0–0.1)
Basophils Relative: 1 %
EOS PCT: 0 %
Eosinophils Absolute: 0 10*3/uL (ref 0.0–0.5)
HCT: 23.8 % — ABNORMAL LOW (ref 34.8–46.6)
Hemoglobin: 7.7 g/dL — ABNORMAL LOW (ref 11.6–15.9)
LYMPHS PCT: 41 %
Lymphs Abs: 1.2 10*3/uL (ref 0.9–3.3)
MCH: 33.3 pg (ref 25.1–34.0)
MCHC: 32.4 g/dL (ref 31.5–36.0)
MCV: 103 fL — AB (ref 79.5–101.0)
Monocytes Absolute: 0.3 10*3/uL (ref 0.1–0.9)
Monocytes Relative: 10 %
NEUTROS PCT: 48 %
Neutro Abs: 1.4 10*3/uL — ABNORMAL LOW (ref 1.5–6.5)
PLATELETS: 189 10*3/uL (ref 145–400)
RBC: 2.31 MIL/uL — AB (ref 3.70–5.45)
RDW: 26.7 % — ABNORMAL HIGH (ref 11.2–14.5)
WBC: 2.9 10*3/uL — ABNORMAL LOW (ref 3.9–10.3)

## 2018-04-24 LAB — CMP (CANCER CENTER ONLY)
ALT: 13 U/L (ref 0–55)
AST: 15 U/L (ref 5–34)
Albumin: 3.6 g/dL (ref 3.5–5.0)
Alkaline Phosphatase: 68 U/L (ref 40–150)
Anion gap: 8 (ref 3–11)
BUN: 20 mg/dL (ref 7–26)
CHLORIDE: 105 mmol/L (ref 98–109)
CO2: 27 mmol/L (ref 22–29)
Calcium: 9 mg/dL (ref 8.4–10.4)
Creatinine: 0.83 mg/dL (ref 0.60–1.10)
GFR, Est AFR Am: >60 mL/min (ref >60)
GFR, Estimated: >60 mL/min (ref >60)
GLUCOSE: 85 mg/dL (ref 70–140)
POTASSIUM: 4.2 mmol/L (ref 3.5–5.1)
Sodium: 140 mmol/L (ref 136–145)
Total Bilirubin: 0.6 mg/dL (ref 0.2–1.2)
Total Protein: 6.5 g/dL (ref 6.4–8.3)

## 2018-04-24 LAB — RETICULOCYTES
RBC.: 2.31 MIL/uL — ABNORMAL LOW (ref 3.70–5.45)
Retic Count, Absolute: 37 10*3/uL (ref 33.7–90.7)
Retic Ct Pct: 1.6 % (ref 0.7–2.1)

## 2018-04-24 LAB — FERRITIN: FERRITIN: 1806 ng/mL — AB (ref 9–269)

## 2018-04-24 LAB — PREPARE RBC (CROSSMATCH)

## 2018-04-24 MED ORDER — SODIUM CHLORIDE 0.9 % IV SOLN
250.0000 mL | Freq: Once | INTRAVENOUS | Status: AC
  Administered 2018-04-24: 250 mL via INTRAVENOUS

## 2018-04-24 MED ORDER — METHYLPREDNISOLONE SODIUM SUCC 40 MG IJ SOLR
40.0000 mg | Freq: Once | INTRAMUSCULAR | Status: AC
  Administered 2018-04-24: 40 mg via INTRAVENOUS

## 2018-04-24 MED ORDER — METHYLPREDNISOLONE SODIUM SUCC 40 MG IJ SOLR
INTRAMUSCULAR | Status: AC
  Filled 2018-04-24: qty 1

## 2018-04-24 MED ORDER — FUROSEMIDE 10 MG/ML IJ SOLN
20.0000 mg | Freq: Once | INTRAMUSCULAR | Status: AC
  Administered 2018-04-24: 20 mg via INTRAVENOUS

## 2018-04-24 MED ORDER — ACETAMINOPHEN 325 MG PO TABS
ORAL_TABLET | ORAL | Status: AC
  Filled 2018-04-24: qty 2

## 2018-04-24 MED ORDER — FUROSEMIDE 10 MG/ML IJ SOLN
INTRAMUSCULAR | Status: AC
  Filled 2018-04-24: qty 2

## 2018-04-24 MED ORDER — ACETAMINOPHEN 325 MG PO TABS
650.0000 mg | ORAL_TABLET | Freq: Once | ORAL | Status: AC
  Administered 2018-04-24: 650 mg via ORAL

## 2018-04-24 NOTE — Telephone Encounter (Signed)
No New Note. 

## 2018-04-24 NOTE — Telephone Encounter (Signed)
Appointments scheduled AVS/Calendar printed per 6/21 los °

## 2018-04-25 LAB — SAMPLE TO BLOOD BANK

## 2018-04-27 LAB — TYPE AND SCREEN
ABO/RH(D): B POS
Antibody Screen: POSITIVE
Donor AG Type: NEGATIVE
UNIT DIVISION: 0

## 2018-04-27 LAB — BPAM RBC
BLOOD PRODUCT EXPIRATION DATE: 201907072359
ISSUE DATE / TIME: 201906211534
Unit Type and Rh: 5100

## 2018-04-29 DIAGNOSIS — R0902 Hypoxemia: Secondary | ICD-10-CM | POA: Diagnosis not present

## 2018-04-29 DIAGNOSIS — E038 Other specified hypothyroidism: Secondary | ICD-10-CM | POA: Diagnosis not present

## 2018-04-29 DIAGNOSIS — W57XXXA Bitten or stung by nonvenomous insect and other nonvenomous arthropods, initial encounter: Secondary | ICD-10-CM | POA: Diagnosis not present

## 2018-04-29 DIAGNOSIS — D500 Iron deficiency anemia secondary to blood loss (chronic): Secondary | ICD-10-CM | POA: Diagnosis not present

## 2018-04-29 DIAGNOSIS — E2740 Unspecified adrenocortical insufficiency: Secondary | ICD-10-CM | POA: Diagnosis not present

## 2018-04-29 DIAGNOSIS — S30860A Insect bite (nonvenomous) of lower back and pelvis, initial encounter: Secondary | ICD-10-CM | POA: Diagnosis not present

## 2018-04-29 DIAGNOSIS — I5032 Chronic diastolic (congestive) heart failure: Secondary | ICD-10-CM | POA: Diagnosis not present

## 2018-04-29 DIAGNOSIS — I10 Essential (primary) hypertension: Secondary | ICD-10-CM | POA: Diagnosis not present

## 2018-04-29 DIAGNOSIS — M810 Age-related osteoporosis without current pathological fracture: Secondary | ICD-10-CM | POA: Diagnosis not present

## 2018-04-29 DIAGNOSIS — I059 Rheumatic mitral valve disease, unspecified: Secondary | ICD-10-CM | POA: Diagnosis not present

## 2018-04-29 DIAGNOSIS — R413 Other amnesia: Secondary | ICD-10-CM | POA: Diagnosis not present

## 2018-04-29 DIAGNOSIS — J84112 Idiopathic pulmonary fibrosis: Secondary | ICD-10-CM | POA: Diagnosis not present

## 2018-05-06 DIAGNOSIS — I4891 Unspecified atrial fibrillation: Secondary | ICD-10-CM | POA: Diagnosis not present

## 2018-05-06 DIAGNOSIS — E7800 Pure hypercholesterolemia, unspecified: Secondary | ICD-10-CM | POA: Diagnosis not present

## 2018-05-06 DIAGNOSIS — J9610 Chronic respiratory failure, unspecified whether with hypoxia or hypercapnia: Secondary | ICD-10-CM | POA: Diagnosis not present

## 2018-05-06 DIAGNOSIS — Z888 Allergy status to other drugs, medicaments and biological substances status: Secondary | ICD-10-CM | POA: Diagnosis not present

## 2018-05-06 DIAGNOSIS — H9190 Unspecified hearing loss, unspecified ear: Secondary | ICD-10-CM | POA: Diagnosis not present

## 2018-05-06 DIAGNOSIS — D649 Anemia, unspecified: Secondary | ICD-10-CM | POA: Diagnosis not present

## 2018-05-06 DIAGNOSIS — I482 Chronic atrial fibrillation: Secondary | ICD-10-CM | POA: Diagnosis not present

## 2018-05-06 DIAGNOSIS — K219 Gastro-esophageal reflux disease without esophagitis: Secondary | ICD-10-CM | POA: Diagnosis not present

## 2018-05-06 DIAGNOSIS — Z86711 Personal history of pulmonary embolism: Secondary | ICD-10-CM | POA: Diagnosis not present

## 2018-05-06 DIAGNOSIS — I119 Hypertensive heart disease without heart failure: Secondary | ICD-10-CM | POA: Diagnosis not present

## 2018-05-06 DIAGNOSIS — Z86718 Personal history of other venous thrombosis and embolism: Secondary | ICD-10-CM | POA: Diagnosis not present

## 2018-05-06 DIAGNOSIS — J8410 Pulmonary fibrosis, unspecified: Secondary | ICD-10-CM | POA: Diagnosis not present

## 2018-05-06 DIAGNOSIS — M810 Age-related osteoporosis without current pathological fracture: Secondary | ICD-10-CM | POA: Diagnosis not present

## 2018-05-06 DIAGNOSIS — I495 Sick sinus syndrome: Secondary | ICD-10-CM | POA: Diagnosis not present

## 2018-05-06 DIAGNOSIS — K279 Peptic ulcer, site unspecified, unspecified as acute or chronic, without hemorrhage or perforation: Secondary | ICD-10-CM | POA: Diagnosis not present

## 2018-05-06 DIAGNOSIS — C946 Myelodysplastic disease, not classified: Secondary | ICD-10-CM | POA: Diagnosis not present

## 2018-05-06 DIAGNOSIS — I341 Nonrheumatic mitral (valve) prolapse: Secondary | ICD-10-CM | POA: Diagnosis not present

## 2018-05-06 DIAGNOSIS — D469 Myelodysplastic syndrome, unspecified: Secondary | ICD-10-CM | POA: Diagnosis not present

## 2018-05-06 DIAGNOSIS — I2510 Atherosclerotic heart disease of native coronary artery without angina pectoris: Secondary | ICD-10-CM | POA: Diagnosis not present

## 2018-05-07 DIAGNOSIS — J9610 Chronic respiratory failure, unspecified whether with hypoxia or hypercapnia: Secondary | ICD-10-CM | POA: Diagnosis not present

## 2018-05-07 DIAGNOSIS — I4891 Unspecified atrial fibrillation: Secondary | ICD-10-CM | POA: Diagnosis not present

## 2018-05-07 DIAGNOSIS — D469 Myelodysplastic syndrome, unspecified: Secondary | ICD-10-CM | POA: Diagnosis not present

## 2018-05-07 DIAGNOSIS — D649 Anemia, unspecified: Secondary | ICD-10-CM | POA: Diagnosis not present

## 2018-05-08 ENCOUNTER — Inpatient Hospital Stay: Payer: Medicare Other

## 2018-05-08 ENCOUNTER — Inpatient Hospital Stay: Payer: Medicare Other | Attending: Hematology and Oncology

## 2018-05-12 ENCOUNTER — Telehealth: Payer: Self-pay | Admitting: Hematology

## 2018-05-12 NOTE — Telephone Encounter (Signed)
Faxed medical records to [facility] on 05/12/18, Release ID: [id number]

## 2018-05-15 ENCOUNTER — Other Ambulatory Visit: Payer: Self-pay | Admitting: *Deleted

## 2018-05-15 DIAGNOSIS — G894 Chronic pain syndrome: Secondary | ICD-10-CM

## 2018-05-15 NOTE — Telephone Encounter (Signed)
Patient caregiver requested [place] Verified LR: 03/13/2018 Pharmacy Confirmed Pended Rx and sent to Dr. Eulas Post for approval.

## 2018-05-16 MED ORDER — HYDROCODONE-ACETAMINOPHEN 5-325 MG PO TABS: 1.0000 | ORAL_TABLET | Freq: Every day | ORAL | 0 refills | Status: DC

## 2018-05-19 ENCOUNTER — Telehealth: Payer: Self-pay | Admitting: Hematology

## 2018-05-19 NOTE — Telephone Encounter (Signed)
Cancelled appt per 7/16 sch message - pt daughter in law aware - did not want to cancel 8/16 appt yet,

## 2018-05-20 DIAGNOSIS — Z45018 Encounter for adjustment and management of other part of cardiac pacemaker: Secondary | ICD-10-CM | POA: Diagnosis not present

## 2018-05-20 DIAGNOSIS — I480 Paroxysmal atrial fibrillation: Secondary | ICD-10-CM | POA: Diagnosis not present

## 2018-05-21 ENCOUNTER — Other Ambulatory Visit: Payer: Self-pay | Admitting: Internal Medicine

## 2018-05-21 MED ORDER — OMEPRAZOLE 20 MG PO CPDR: DELAYED_RELEASE_CAPSULE | ORAL | 1 refills | Status: AC

## 2018-05-21 NOTE — Telephone Encounter (Signed)
A medication refill request was received from the pharmacy for omeprazole 40 mg. However, patient is currently on 20 mg BID. A prescription for the correct dosage was sent to the pharmacy electronically.

## 2018-05-22 ENCOUNTER — Other Ambulatory Visit: Payer: Medicare Other

## 2018-05-22 ENCOUNTER — Ambulatory Visit: Payer: Medicare Other | Admitting: Hematology

## 2018-05-27 DIAGNOSIS — R0609 Other forms of dyspnea: Secondary | ICD-10-CM | POA: Diagnosis not present

## 2018-05-27 DIAGNOSIS — R0902 Hypoxemia: Secondary | ICD-10-CM | POA: Diagnosis not present

## 2018-05-27 DIAGNOSIS — D6189 Other specified aplastic anemias and other bone marrow failure syndromes: Secondary | ICD-10-CM | POA: Diagnosis not present

## 2018-05-27 DIAGNOSIS — R531 Weakness: Secondary | ICD-10-CM | POA: Diagnosis not present

## 2018-05-27 DIAGNOSIS — D469 Myelodysplastic syndrome, unspecified: Secondary | ICD-10-CM | POA: Diagnosis not present

## 2018-05-27 DIAGNOSIS — J84112 Idiopathic pulmonary fibrosis: Secondary | ICD-10-CM | POA: Diagnosis not present

## 2018-05-28 DIAGNOSIS — R0609 Other forms of dyspnea: Secondary | ICD-10-CM | POA: Diagnosis not present

## 2018-05-28 DIAGNOSIS — D469 Myelodysplastic syndrome, unspecified: Secondary | ICD-10-CM | POA: Diagnosis not present

## 2018-05-28 DIAGNOSIS — R0902 Hypoxemia: Secondary | ICD-10-CM | POA: Diagnosis not present

## 2018-05-28 DIAGNOSIS — J84112 Idiopathic pulmonary fibrosis: Secondary | ICD-10-CM | POA: Diagnosis not present

## 2018-05-28 DIAGNOSIS — R531 Weakness: Secondary | ICD-10-CM | POA: Diagnosis not present

## 2018-06-02 DIAGNOSIS — F0390 Unspecified dementia without behavioral disturbance: Secondary | ICD-10-CM | POA: Diagnosis not present

## 2018-06-02 DIAGNOSIS — I2699 Other pulmonary embolism without acute cor pulmonale: Secondary | ICD-10-CM | POA: Diagnosis not present

## 2018-06-02 DIAGNOSIS — D500 Iron deficiency anemia secondary to blood loss (chronic): Secondary | ICD-10-CM | POA: Diagnosis not present

## 2018-06-02 DIAGNOSIS — D464 Refractory anemia, unspecified: Secondary | ICD-10-CM | POA: Diagnosis not present

## 2018-06-02 DIAGNOSIS — D61818 Other pancytopenia: Secondary | ICD-10-CM | POA: Diagnosis not present

## 2018-06-02 DIAGNOSIS — J84112 Idiopathic pulmonary fibrosis: Secondary | ICD-10-CM | POA: Diagnosis not present

## 2018-06-02 DIAGNOSIS — D518 Other vitamin B12 deficiency anemias: Secondary | ICD-10-CM | POA: Diagnosis not present

## 2018-06-05 ENCOUNTER — Other Ambulatory Visit: Payer: Medicare Other

## 2018-06-12 ENCOUNTER — Other Ambulatory Visit: Payer: Self-pay | Admitting: *Deleted

## 2018-06-12 DIAGNOSIS — G894 Chronic pain syndrome: Secondary | ICD-10-CM

## 2018-06-12 MED ORDER — HYDROCODONE-ACETAMINOPHEN 5-325 MG PO TABS: 1.0000 | ORAL_TABLET | Freq: Every day | ORAL | 0 refills | Status: DC

## 2018-06-12 NOTE — Telephone Encounter (Signed)
Database checked and verified

## 2018-06-16 ENCOUNTER — Ambulatory Visit: Payer: Medicare Other | Admitting: Internal Medicine

## 2018-06-18 ENCOUNTER — Telehealth: Payer: Self-pay

## 2018-06-18 ENCOUNTER — Other Ambulatory Visit: Payer: Self-pay | Admitting: Internal Medicine

## 2018-06-18 NOTE — Telephone Encounter (Signed)
Daughter called to cancel appointments. Transferred call to RN due to blood. Per 8/15 vm return call

## 2018-06-19 ENCOUNTER — Inpatient Hospital Stay: Payer: Medicare Other

## 2018-06-19 ENCOUNTER — Other Ambulatory Visit: Payer: Medicare Other

## 2018-06-23 DIAGNOSIS — D464 Refractory anemia, unspecified: Secondary | ICD-10-CM | POA: Diagnosis not present

## 2018-06-24 ENCOUNTER — Encounter: Payer: Self-pay | Admitting: Internal Medicine

## 2018-06-24 IMAGING — CR DG CHEST 2V
3 series · 3 of 3 positions shown · non-contrast
Comparison: None.

CLINICAL DATA: Shortness of breath since a fall yesterday morning.

EXAM:
CHEST  2 VIEW

[chest lat]
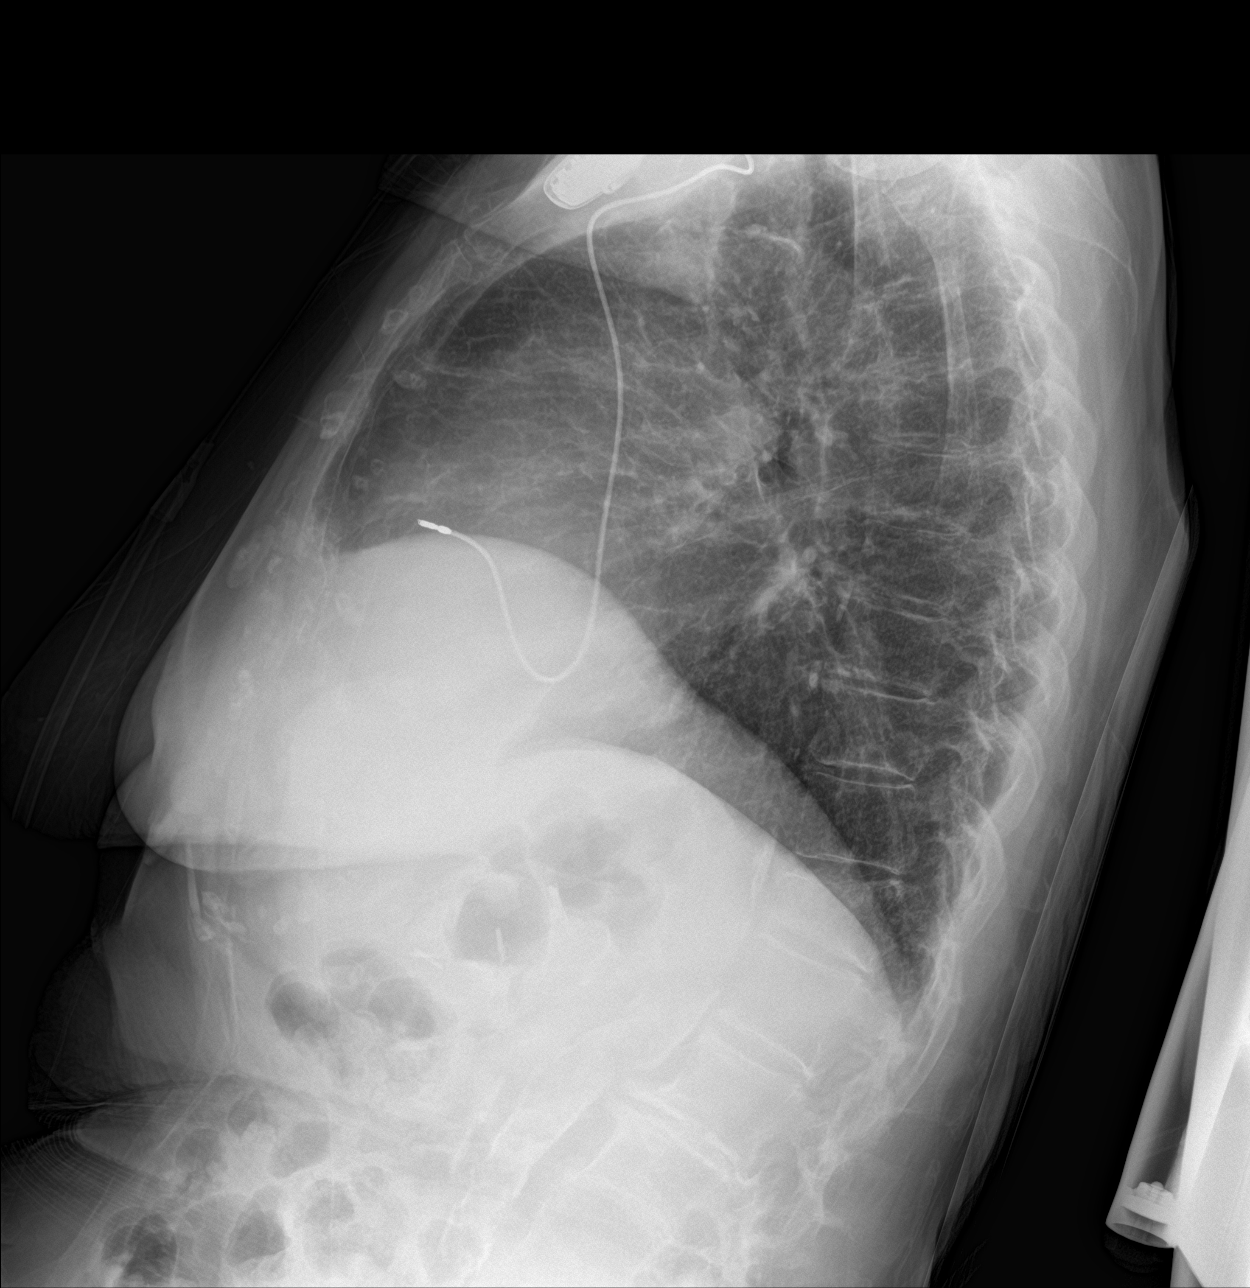

[chest ap (1 of 2)]
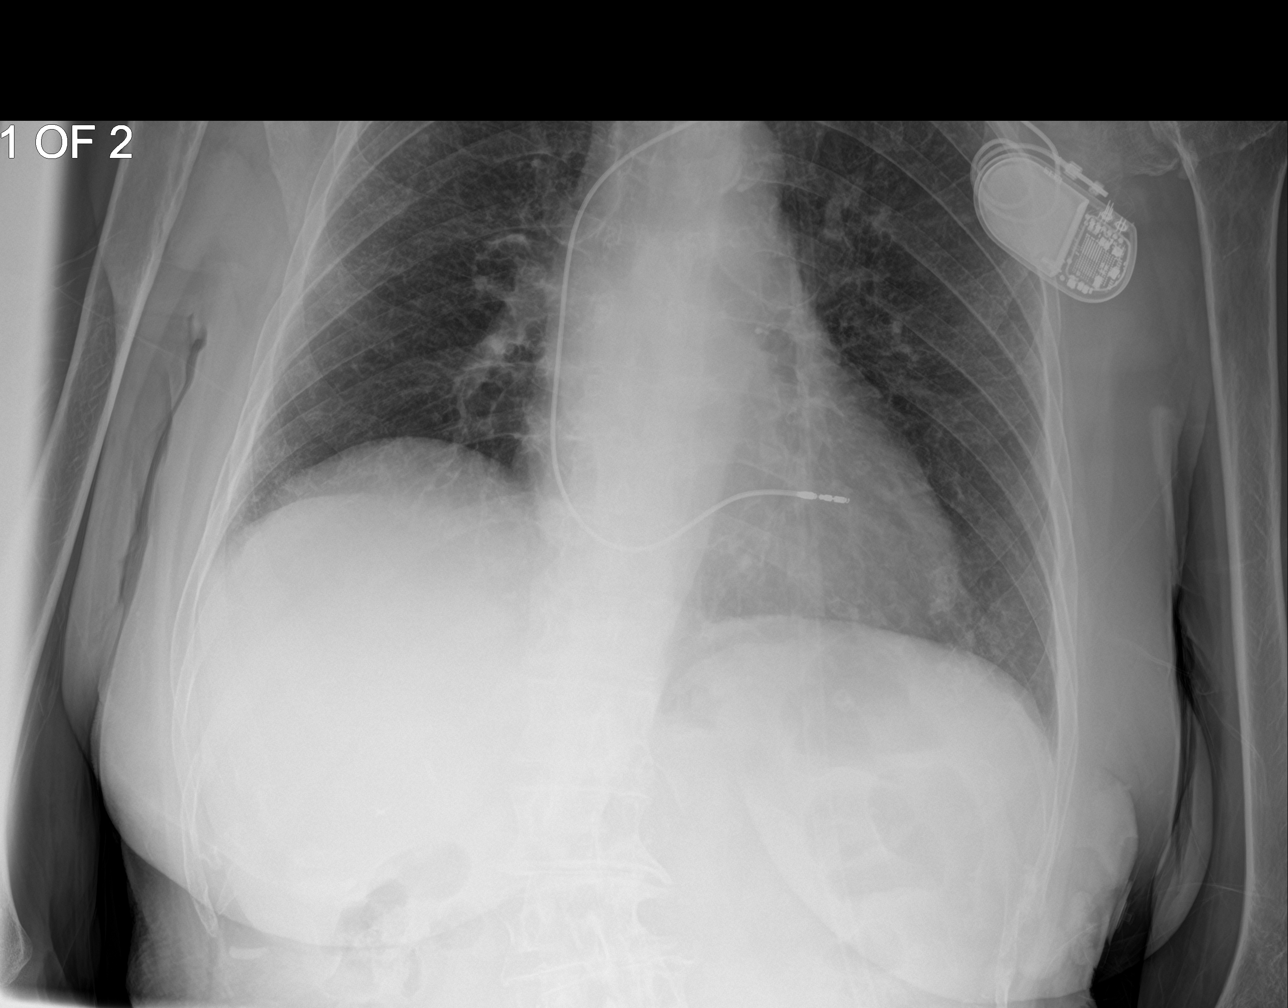

[chest ap (2 of 2)]
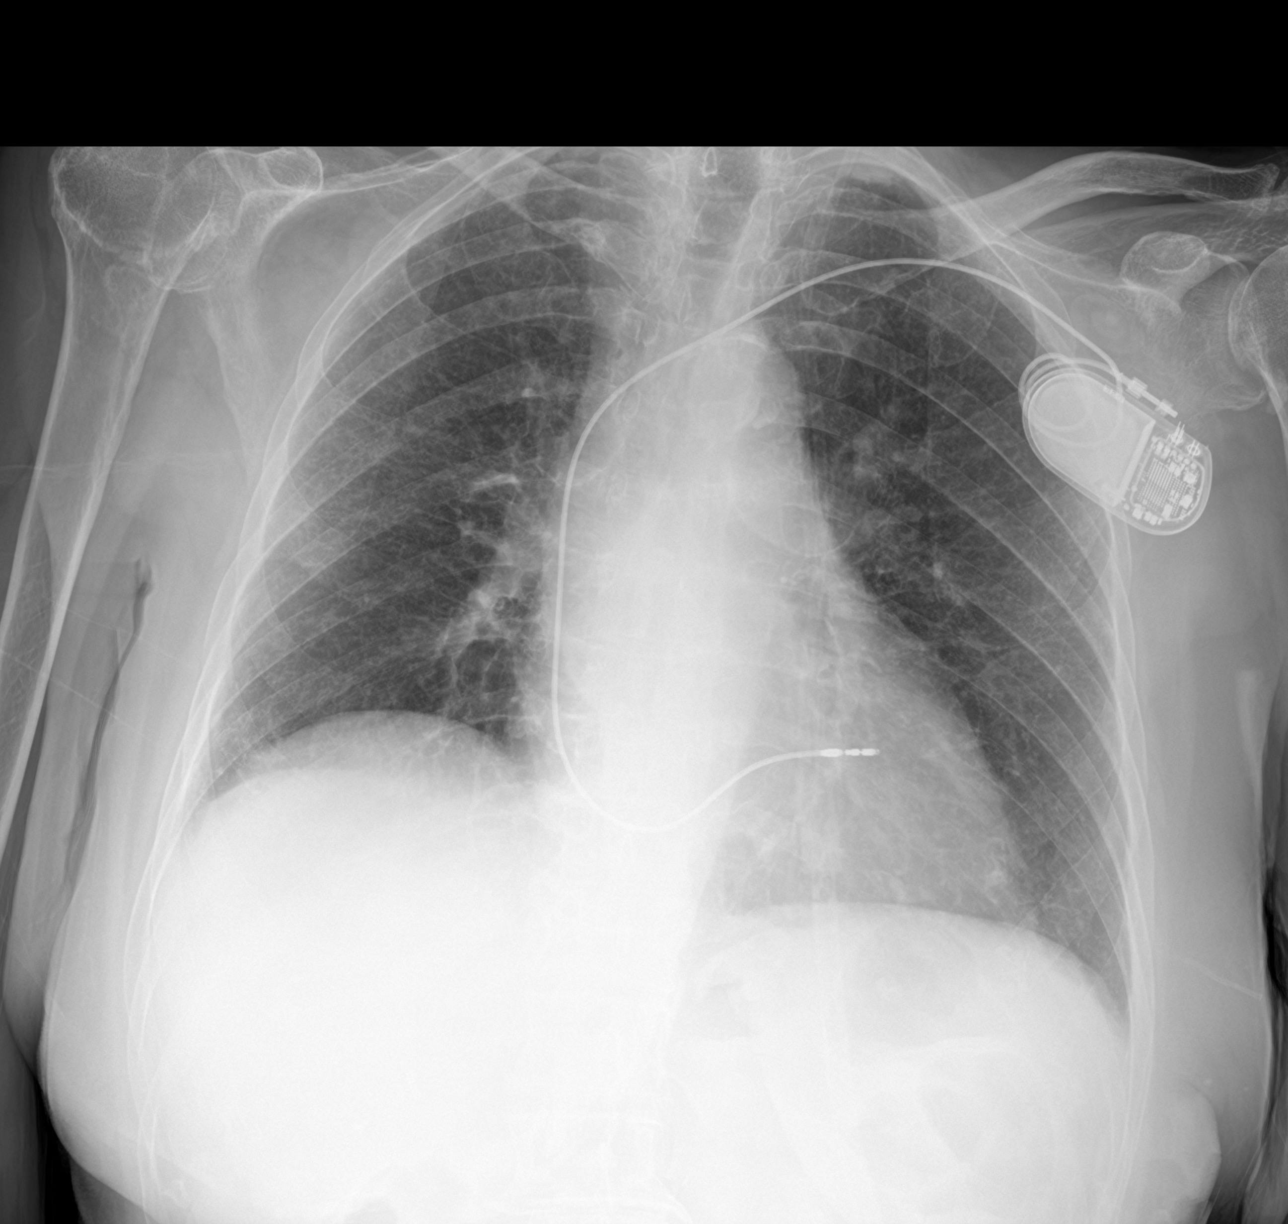

[3 of 3 positions shown; findings below may reference images not displayed]

FINDINGS: Cardiac pacemaker. Normal heart size and pulmonary vascularity. No
focal airspace disease or consolidation in the lungs. Mild
bronchiectasis and bronchial wall thickening suggesting bronchitis.
This may be acute or chronic. No blunting of costophrenic angles. No
pneumothorax. Mediastinal contours appear intact. Calcification of
the aorta. Degenerative changes in the shoulders and in the spine.
Anterior compression of T12 with about 50% loss of height. Age is
indeterminate.
IMPRESSION: No focal consolidation or airspace disease. Bronchitic changes in
the lungs may be acute or chronic. Anterior compression of T12 is
age-indeterminate.

## 2018-06-24 IMAGING — CT CT HEAD W/O CM
4 series · 19 of 47 positions shown, 21 images · non-contrast
Comparison: none

[Series 201: head w/o, idose (1) · axial · non-contrast · 0.49mm/px · z∈[+97,+212]mm · 8 of 31 slices shown, 10 images]
[im 4/31  brain]
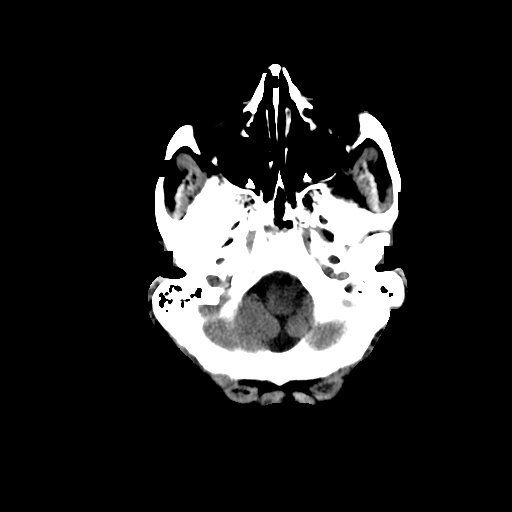
[im 4/31  bone]
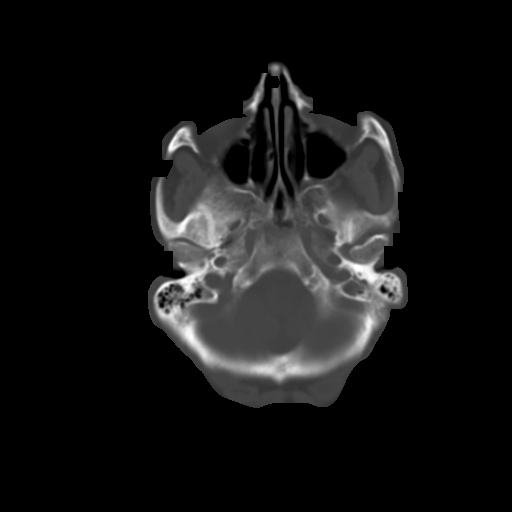
[im 7/31  brain]
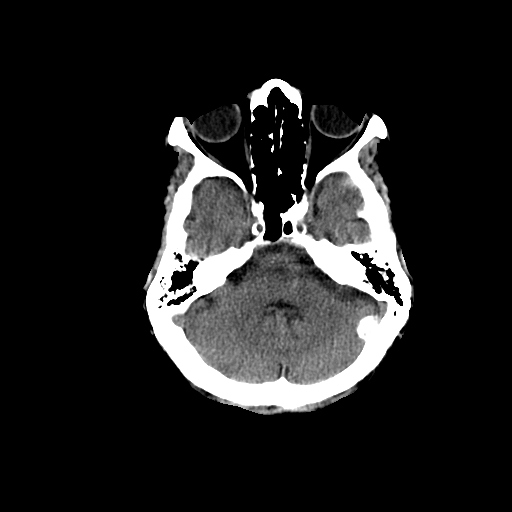
[im 11/31  brain]
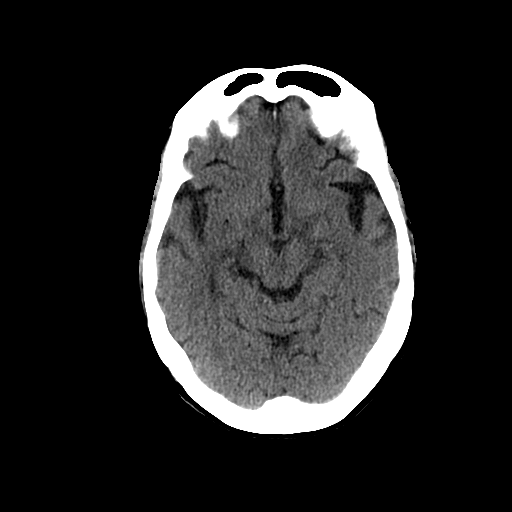
[im 14/31  brain]
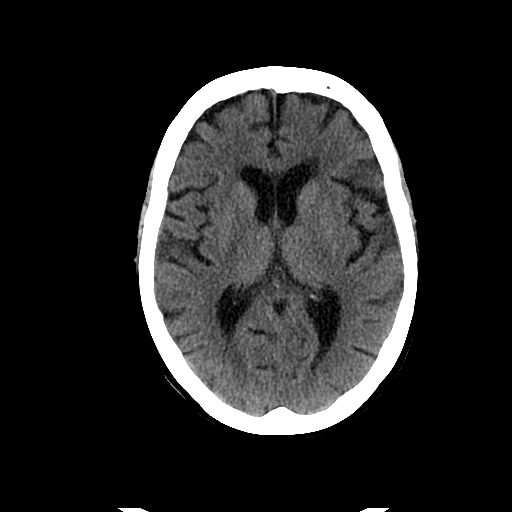
[im 17/31  brain]
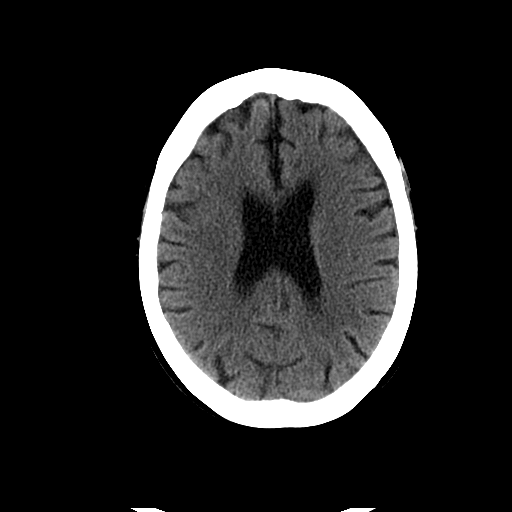
[im 17/31  bone]
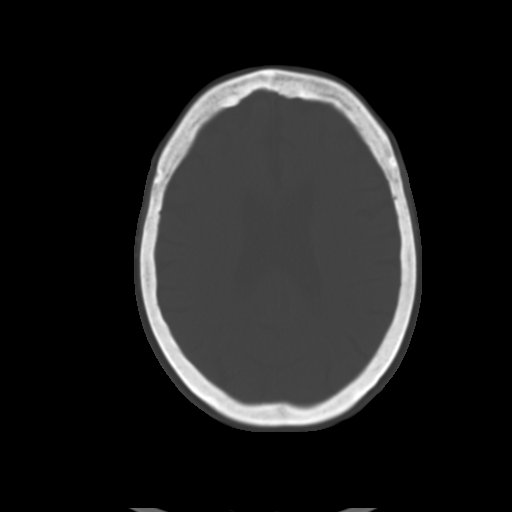
[im 21/31  brain]
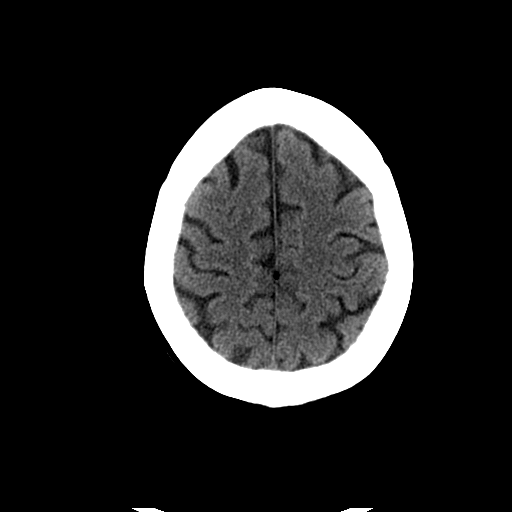
[im 24/31  brain]
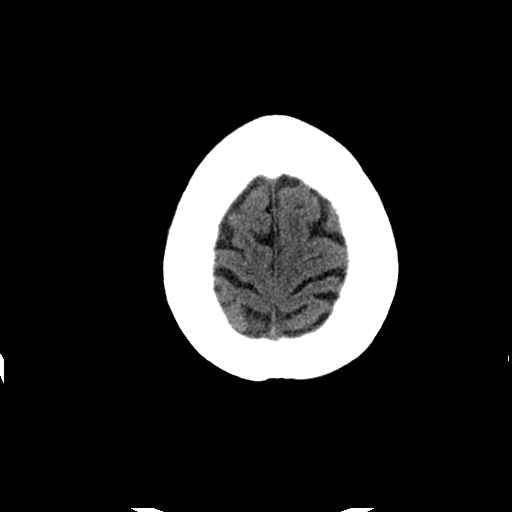
[im 27/31  brain]
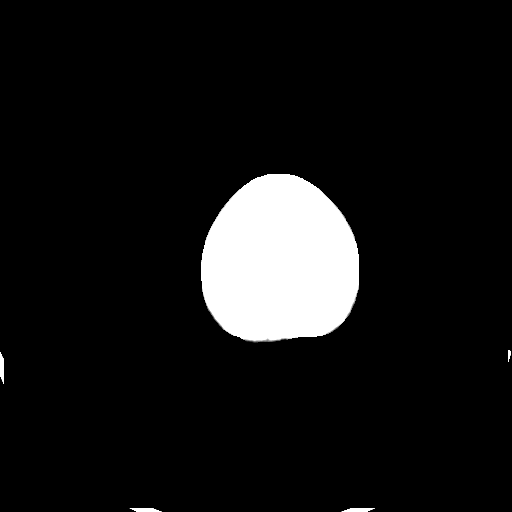

[Series 202: head w/o bone, idose (1) · axial · non-contrast · 0.49mm/px · z∈[+96,+168]mm · 5 of 62 slices shown]
[im 7/62  bone]
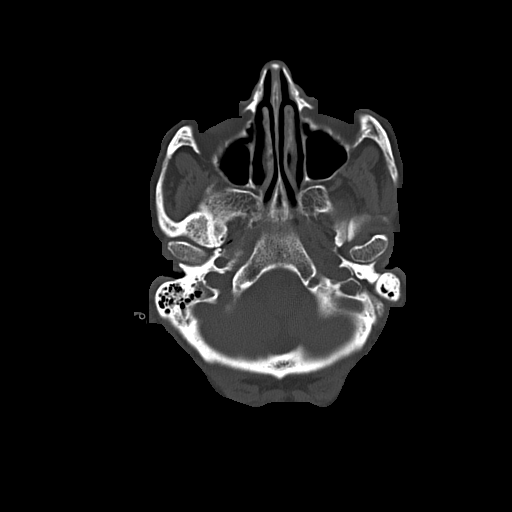
[im 13/62  bone]
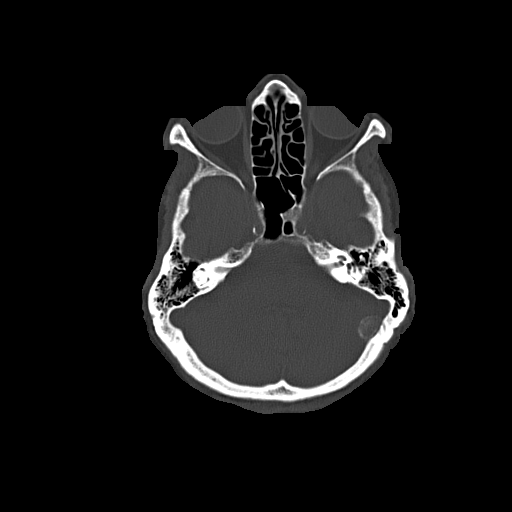
[im 20/62  bone]
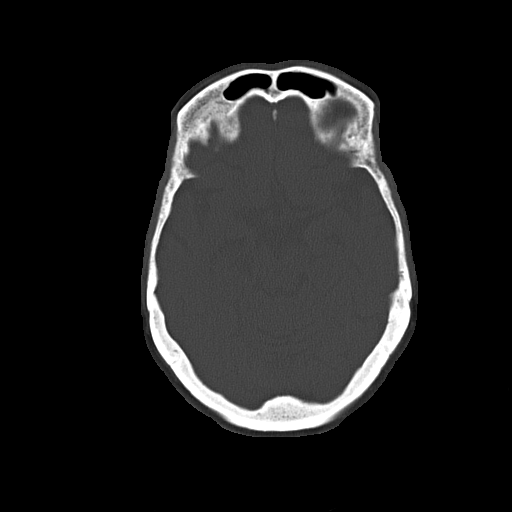
[im 26/62  bone]
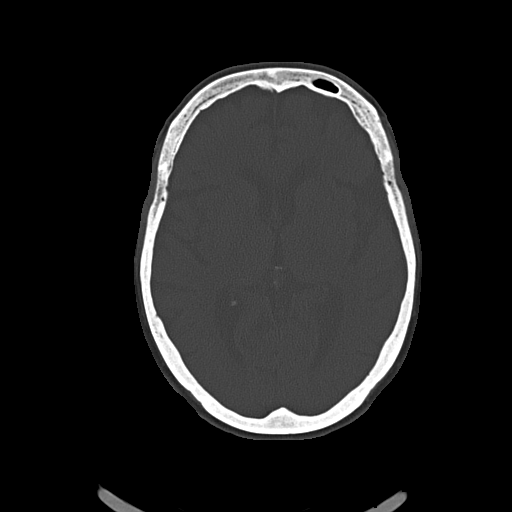
[im 36/62  bone]
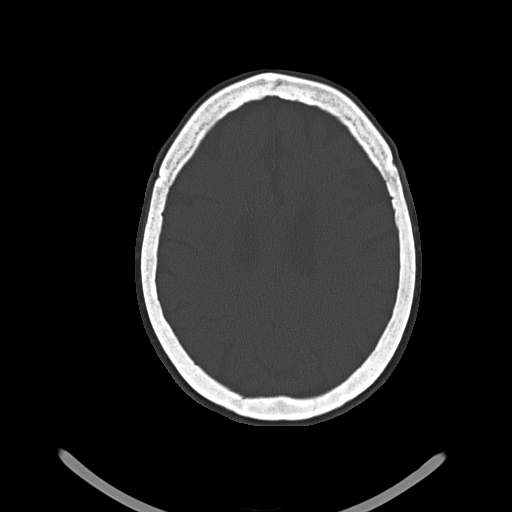

[Series 203: coronal st, idose (1) · coronal · 0.40mm/px · 3 of 76 slices shown]
[im 26/76  brain]
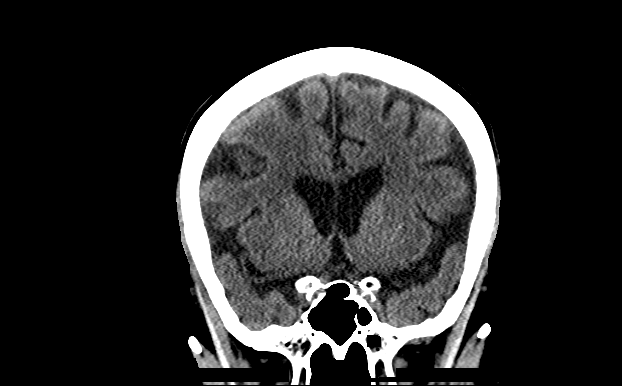
[im 34/76  brain]
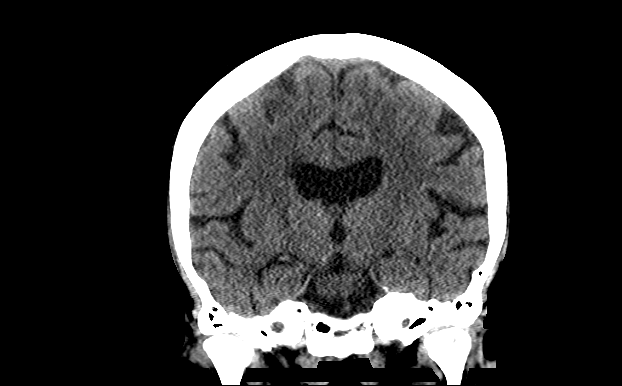
[im 42/76  brain]
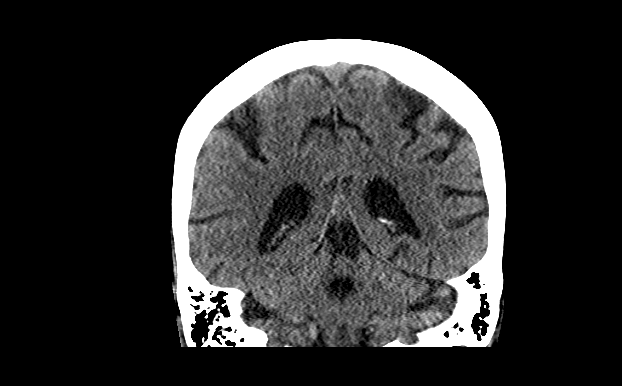

[Series 204: sagittal st, idose (1) · sagittal · 0.40mm/px · 3 of 83 slices shown]
[im 28/83  brain]
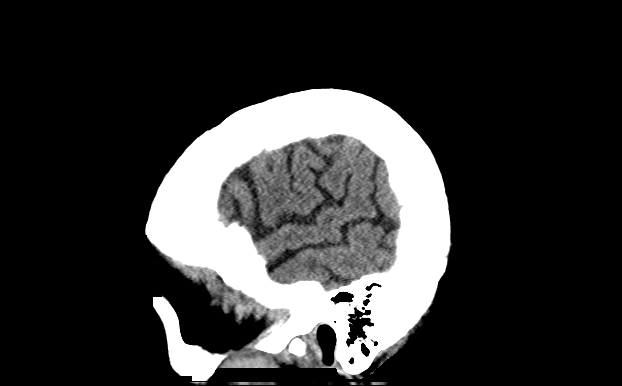
[im 42/83  brain]
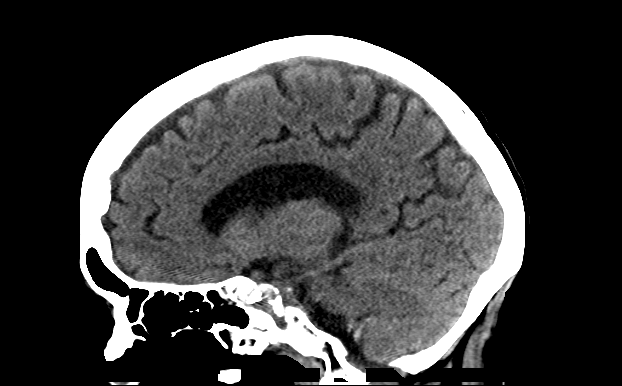
[im 55/83  brain]
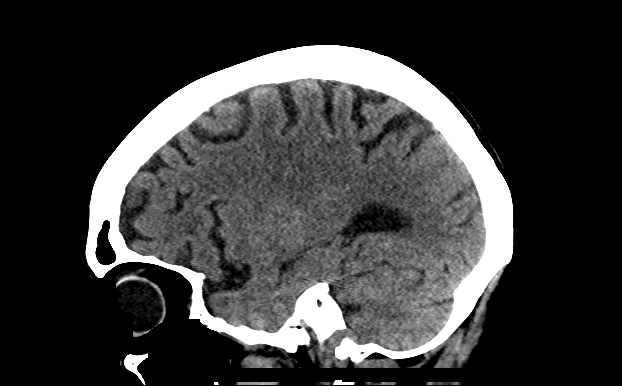

[19 of 47 positions shown; findings below may reference images not displayed]

Canned report from images found in remote index.

Refer to host system for actual result text.

## 2018-06-30 DIAGNOSIS — D46C Myelodysplastic syndrome with isolated del(5q) chromosomal abnormality: Secondary | ICD-10-CM | POA: Diagnosis not present

## 2018-06-30 DIAGNOSIS — D500 Iron deficiency anemia secondary to blood loss (chronic): Secondary | ICD-10-CM | POA: Diagnosis not present

## 2018-07-13 DIAGNOSIS — D464 Refractory anemia, unspecified: Secondary | ICD-10-CM | POA: Diagnosis not present

## 2018-07-14 ENCOUNTER — Other Ambulatory Visit: Payer: Self-pay | Admitting: *Deleted

## 2018-07-14 DIAGNOSIS — J84112 Idiopathic pulmonary fibrosis: Secondary | ICD-10-CM | POA: Diagnosis not present

## 2018-07-14 DIAGNOSIS — D500 Iron deficiency anemia secondary to blood loss (chronic): Secondary | ICD-10-CM | POA: Diagnosis not present

## 2018-07-14 DIAGNOSIS — I10 Essential (primary) hypertension: Secondary | ICD-10-CM | POA: Diagnosis not present

## 2018-07-14 DIAGNOSIS — F0390 Unspecified dementia without behavioral disturbance: Secondary | ICD-10-CM | POA: Diagnosis not present

## 2018-07-14 DIAGNOSIS — D46C Myelodysplastic syndrome with isolated del(5q) chromosomal abnormality: Secondary | ICD-10-CM | POA: Diagnosis not present

## 2018-07-14 DIAGNOSIS — D469 Myelodysplastic syndrome, unspecified: Secondary | ICD-10-CM | POA: Diagnosis not present

## 2018-07-14 DIAGNOSIS — G894 Chronic pain syndrome: Secondary | ICD-10-CM

## 2018-07-14 DIAGNOSIS — I2699 Other pulmonary embolism without acute cor pulmonale: Secondary | ICD-10-CM | POA: Diagnosis not present

## 2018-07-14 MED ORDER — HYDROCODONE-ACETAMINOPHEN 5-325 MG PO TABS: 1.0000 | ORAL_TABLET | Freq: Every day | ORAL | 0 refills | Status: AC

## 2018-07-17 DIAGNOSIS — D469 Myelodysplastic syndrome, unspecified: Secondary | ICD-10-CM | POA: Diagnosis not present

## 2018-07-21 DIAGNOSIS — D46C Myelodysplastic syndrome with isolated del(5q) chromosomal abnormality: Secondary | ICD-10-CM | POA: Diagnosis not present

## 2018-07-21 DIAGNOSIS — D500 Iron deficiency anemia secondary to blood loss (chronic): Secondary | ICD-10-CM | POA: Diagnosis not present

## 2018-07-28 DIAGNOSIS — J84112 Idiopathic pulmonary fibrosis: Secondary | ICD-10-CM | POA: Diagnosis not present

## 2018-07-28 DIAGNOSIS — I2699 Other pulmonary embolism without acute cor pulmonale: Secondary | ICD-10-CM | POA: Diagnosis not present

## 2018-07-28 DIAGNOSIS — D46C Myelodysplastic syndrome with isolated del(5q) chromosomal abnormality: Secondary | ICD-10-CM | POA: Diagnosis not present

## 2018-07-28 DIAGNOSIS — D464 Refractory anemia, unspecified: Secondary | ICD-10-CM | POA: Diagnosis not present

## 2018-07-28 DIAGNOSIS — I10 Essential (primary) hypertension: Secondary | ICD-10-CM | POA: Diagnosis not present

## 2018-07-28 DIAGNOSIS — F0390 Unspecified dementia without behavioral disturbance: Secondary | ICD-10-CM | POA: Diagnosis not present

## 2018-08-04 DIAGNOSIS — I10 Essential (primary) hypertension: Secondary | ICD-10-CM | POA: Diagnosis not present

## 2018-08-04 DIAGNOSIS — D46C Myelodysplastic syndrome with isolated del(5q) chromosomal abnormality: Secondary | ICD-10-CM | POA: Diagnosis not present

## 2018-08-04 DIAGNOSIS — D464 Refractory anemia, unspecified: Secondary | ICD-10-CM | POA: Diagnosis not present

## 2018-08-04 DIAGNOSIS — J84112 Idiopathic pulmonary fibrosis: Secondary | ICD-10-CM | POA: Diagnosis not present

## 2018-08-04 DIAGNOSIS — F0390 Unspecified dementia without behavioral disturbance: Secondary | ICD-10-CM | POA: Diagnosis not present

## 2018-08-04 DIAGNOSIS — I2699 Other pulmonary embolism without acute cor pulmonale: Secondary | ICD-10-CM | POA: Diagnosis not present

## 2018-08-11 DIAGNOSIS — Z885 Allergy status to narcotic agent status: Secondary | ICD-10-CM | POA: Diagnosis not present

## 2018-08-11 DIAGNOSIS — D696 Thrombocytopenia, unspecified: Secondary | ICD-10-CM | POA: Diagnosis not present

## 2018-08-11 DIAGNOSIS — E876 Hypokalemia: Secondary | ICD-10-CM | POA: Diagnosis not present

## 2018-08-11 DIAGNOSIS — R531 Weakness: Secondary | ICD-10-CM | POA: Diagnosis not present

## 2018-08-11 DIAGNOSIS — R0602 Shortness of breath: Secondary | ICD-10-CM | POA: Diagnosis not present

## 2018-08-11 DIAGNOSIS — D464 Refractory anemia, unspecified: Secondary | ICD-10-CM | POA: Diagnosis not present

## 2018-08-11 DIAGNOSIS — Z888 Allergy status to other drugs, medicaments and biological substances status: Secondary | ICD-10-CM | POA: Diagnosis not present

## 2018-08-11 DIAGNOSIS — Z8711 Personal history of peptic ulcer disease: Secondary | ICD-10-CM | POA: Diagnosis not present

## 2018-08-11 DIAGNOSIS — D649 Anemia, unspecified: Secondary | ICD-10-CM | POA: Diagnosis not present

## 2018-08-11 DIAGNOSIS — D469 Myelodysplastic syndrome, unspecified: Secondary | ICD-10-CM | POA: Diagnosis present

## 2018-08-11 DIAGNOSIS — Z8669 Personal history of other diseases of the nervous system and sense organs: Secondary | ICD-10-CM | POA: Diagnosis not present

## 2018-08-11 DIAGNOSIS — Z86718 Personal history of other venous thrombosis and embolism: Secondary | ICD-10-CM | POA: Diagnosis not present

## 2018-08-11 DIAGNOSIS — K219 Gastro-esophageal reflux disease without esophagitis: Secondary | ICD-10-CM | POA: Diagnosis present

## 2018-08-11 DIAGNOSIS — Z8719 Personal history of other diseases of the digestive system: Secondary | ICD-10-CM | POA: Diagnosis not present

## 2018-08-11 DIAGNOSIS — D46C Myelodysplastic syndrome with isolated del(5q) chromosomal abnormality: Secondary | ICD-10-CM | POA: Diagnosis not present

## 2018-08-11 DIAGNOSIS — Z8673 Personal history of transient ischemic attack (TIA), and cerebral infarction without residual deficits: Secondary | ICD-10-CM | POA: Diagnosis not present

## 2018-08-11 DIAGNOSIS — R718 Other abnormality of red blood cells: Secondary | ICD-10-CM | POA: Diagnosis not present

## 2018-08-11 DIAGNOSIS — R0600 Dyspnea, unspecified: Secondary | ICD-10-CM | POA: Diagnosis present

## 2018-08-11 DIAGNOSIS — N183 Chronic kidney disease, stage 3 (moderate): Secondary | ICD-10-CM | POA: Diagnosis present

## 2018-08-11 DIAGNOSIS — Z79891 Long term (current) use of opiate analgesic: Secondary | ICD-10-CM | POA: Diagnosis not present

## 2018-08-11 DIAGNOSIS — Z8679 Personal history of other diseases of the circulatory system: Secondary | ICD-10-CM | POA: Diagnosis not present

## 2018-08-11 DIAGNOSIS — Z79899 Other long term (current) drug therapy: Secondary | ICD-10-CM | POA: Diagnosis not present

## 2018-08-11 DIAGNOSIS — Z9981 Dependence on supplemental oxygen: Secondary | ICD-10-CM | POA: Diagnosis not present

## 2018-08-11 DIAGNOSIS — Z8709 Personal history of other diseases of the respiratory system: Secondary | ICD-10-CM | POA: Diagnosis not present

## 2018-08-11 DIAGNOSIS — I129 Hypertensive chronic kidney disease with stage 1 through stage 4 chronic kidney disease, or unspecified chronic kidney disease: Secondary | ICD-10-CM | POA: Diagnosis present

## 2018-08-11 DIAGNOSIS — D61818 Other pancytopenia: Secondary | ICD-10-CM | POA: Diagnosis present

## 2018-08-11 DIAGNOSIS — R112 Nausea with vomiting, unspecified: Secondary | ICD-10-CM | POA: Diagnosis not present

## 2018-08-12 DIAGNOSIS — D464 Refractory anemia, unspecified: Secondary | ICD-10-CM | POA: Diagnosis not present

## 2018-08-17 DIAGNOSIS — I2510 Atherosclerotic heart disease of native coronary artery without angina pectoris: Secondary | ICD-10-CM | POA: Diagnosis not present

## 2018-08-17 DIAGNOSIS — K219 Gastro-esophageal reflux disease without esophagitis: Secondary | ICD-10-CM | POA: Diagnosis not present

## 2018-08-17 DIAGNOSIS — D649 Anemia, unspecified: Secondary | ICD-10-CM | POA: Diagnosis not present

## 2018-08-17 DIAGNOSIS — I10 Essential (primary) hypertension: Secondary | ICD-10-CM | POA: Diagnosis not present

## 2018-08-17 DIAGNOSIS — I951 Orthostatic hypotension: Secondary | ICD-10-CM | POA: Diagnosis not present

## 2018-08-17 DIAGNOSIS — D469 Myelodysplastic syndrome, unspecified: Secondary | ICD-10-CM | POA: Diagnosis not present

## 2018-08-18 DIAGNOSIS — D469 Myelodysplastic syndrome, unspecified: Secondary | ICD-10-CM | POA: Diagnosis not present

## 2018-08-18 DIAGNOSIS — I2510 Atherosclerotic heart disease of native coronary artery without angina pectoris: Secondary | ICD-10-CM | POA: Diagnosis not present

## 2018-08-18 DIAGNOSIS — D649 Anemia, unspecified: Secondary | ICD-10-CM | POA: Diagnosis not present

## 2018-08-18 DIAGNOSIS — I951 Orthostatic hypotension: Secondary | ICD-10-CM | POA: Diagnosis not present

## 2018-08-18 DIAGNOSIS — K219 Gastro-esophageal reflux disease without esophagitis: Secondary | ICD-10-CM | POA: Diagnosis not present

## 2018-08-18 DIAGNOSIS — I10 Essential (primary) hypertension: Secondary | ICD-10-CM | POA: Diagnosis not present

## 2018-08-19 DIAGNOSIS — I10 Essential (primary) hypertension: Secondary | ICD-10-CM | POA: Diagnosis not present

## 2018-08-19 DIAGNOSIS — D469 Myelodysplastic syndrome, unspecified: Secondary | ICD-10-CM | POA: Diagnosis not present

## 2018-08-19 DIAGNOSIS — Z45018 Encounter for adjustment and management of other part of cardiac pacemaker: Secondary | ICD-10-CM | POA: Diagnosis not present

## 2018-08-19 DIAGNOSIS — K219 Gastro-esophageal reflux disease without esophagitis: Secondary | ICD-10-CM | POA: Diagnosis not present

## 2018-08-19 DIAGNOSIS — Z4509 Encounter for adjustment and management of other cardiac device: Secondary | ICD-10-CM | POA: Diagnosis not present

## 2018-08-19 DIAGNOSIS — D649 Anemia, unspecified: Secondary | ICD-10-CM | POA: Diagnosis not present

## 2018-08-19 DIAGNOSIS — I4891 Unspecified atrial fibrillation: Secondary | ICD-10-CM | POA: Diagnosis not present

## 2018-08-19 DIAGNOSIS — I2510 Atherosclerotic heart disease of native coronary artery without angina pectoris: Secondary | ICD-10-CM | POA: Diagnosis not present

## 2018-08-19 DIAGNOSIS — I951 Orthostatic hypotension: Secondary | ICD-10-CM | POA: Diagnosis not present

## 2018-08-24 DIAGNOSIS — D469 Myelodysplastic syndrome, unspecified: Secondary | ICD-10-CM | POA: Diagnosis not present

## 2018-08-24 DIAGNOSIS — I10 Essential (primary) hypertension: Secondary | ICD-10-CM | POA: Diagnosis not present

## 2018-08-24 DIAGNOSIS — I951 Orthostatic hypotension: Secondary | ICD-10-CM | POA: Diagnosis not present

## 2018-08-24 DIAGNOSIS — K219 Gastro-esophageal reflux disease without esophagitis: Secondary | ICD-10-CM | POA: Diagnosis not present

## 2018-08-24 DIAGNOSIS — D649 Anemia, unspecified: Secondary | ICD-10-CM | POA: Diagnosis not present

## 2018-08-24 DIAGNOSIS — I2510 Atherosclerotic heart disease of native coronary artery without angina pectoris: Secondary | ICD-10-CM | POA: Diagnosis not present

## 2018-08-25 DIAGNOSIS — I2699 Other pulmonary embolism without acute cor pulmonale: Secondary | ICD-10-CM | POA: Diagnosis not present

## 2018-08-25 DIAGNOSIS — D464 Refractory anemia, unspecified: Secondary | ICD-10-CM | POA: Diagnosis not present

## 2018-08-25 DIAGNOSIS — D46C Myelodysplastic syndrome with isolated del(5q) chromosomal abnormality: Secondary | ICD-10-CM | POA: Diagnosis not present

## 2018-08-26 DIAGNOSIS — I10 Essential (primary) hypertension: Secondary | ICD-10-CM | POA: Diagnosis not present

## 2018-08-26 DIAGNOSIS — I2510 Atherosclerotic heart disease of native coronary artery without angina pectoris: Secondary | ICD-10-CM | POA: Diagnosis not present

## 2018-08-26 DIAGNOSIS — I951 Orthostatic hypotension: Secondary | ICD-10-CM | POA: Diagnosis not present

## 2018-08-26 DIAGNOSIS — D649 Anemia, unspecified: Secondary | ICD-10-CM | POA: Diagnosis not present

## 2018-08-26 DIAGNOSIS — D469 Myelodysplastic syndrome, unspecified: Secondary | ICD-10-CM | POA: Diagnosis not present

## 2018-08-26 DIAGNOSIS — K219 Gastro-esophageal reflux disease without esophagitis: Secondary | ICD-10-CM | POA: Diagnosis not present

## 2018-08-27 ENCOUNTER — Ambulatory Visit: Payer: Medicare Other

## 2018-08-28 DIAGNOSIS — D649 Anemia, unspecified: Secondary | ICD-10-CM | POA: Diagnosis not present

## 2018-08-28 DIAGNOSIS — I2510 Atherosclerotic heart disease of native coronary artery without angina pectoris: Secondary | ICD-10-CM | POA: Diagnosis not present

## 2018-08-28 DIAGNOSIS — I951 Orthostatic hypotension: Secondary | ICD-10-CM | POA: Diagnosis not present

## 2018-08-28 DIAGNOSIS — D469 Myelodysplastic syndrome, unspecified: Secondary | ICD-10-CM | POA: Diagnosis not present

## 2018-08-28 DIAGNOSIS — K219 Gastro-esophageal reflux disease without esophagitis: Secondary | ICD-10-CM | POA: Diagnosis not present

## 2018-08-28 DIAGNOSIS — I10 Essential (primary) hypertension: Secondary | ICD-10-CM | POA: Diagnosis not present

## 2018-08-31 DIAGNOSIS — D469 Myelodysplastic syndrome, unspecified: Secondary | ICD-10-CM | POA: Diagnosis not present

## 2018-08-31 DIAGNOSIS — I10 Essential (primary) hypertension: Secondary | ICD-10-CM | POA: Diagnosis not present

## 2018-08-31 DIAGNOSIS — I2510 Atherosclerotic heart disease of native coronary artery without angina pectoris: Secondary | ICD-10-CM | POA: Diagnosis not present

## 2018-08-31 DIAGNOSIS — I951 Orthostatic hypotension: Secondary | ICD-10-CM | POA: Diagnosis not present

## 2018-08-31 DIAGNOSIS — K219 Gastro-esophageal reflux disease without esophagitis: Secondary | ICD-10-CM | POA: Diagnosis not present

## 2018-08-31 DIAGNOSIS — D649 Anemia, unspecified: Secondary | ICD-10-CM | POA: Diagnosis not present

## 2018-09-01 DIAGNOSIS — D500 Iron deficiency anemia secondary to blood loss (chronic): Secondary | ICD-10-CM | POA: Diagnosis not present

## 2018-09-02 DIAGNOSIS — I951 Orthostatic hypotension: Secondary | ICD-10-CM | POA: Diagnosis not present

## 2018-09-02 DIAGNOSIS — I2510 Atherosclerotic heart disease of native coronary artery without angina pectoris: Secondary | ICD-10-CM | POA: Diagnosis not present

## 2018-09-02 DIAGNOSIS — D649 Anemia, unspecified: Secondary | ICD-10-CM | POA: Diagnosis not present

## 2018-09-02 DIAGNOSIS — K219 Gastro-esophageal reflux disease without esophagitis: Secondary | ICD-10-CM | POA: Diagnosis not present

## 2018-09-02 DIAGNOSIS — D469 Myelodysplastic syndrome, unspecified: Secondary | ICD-10-CM | POA: Diagnosis not present

## 2018-09-02 DIAGNOSIS — I10 Essential (primary) hypertension: Secondary | ICD-10-CM | POA: Diagnosis not present

## 2018-09-03 DIAGNOSIS — I951 Orthostatic hypotension: Secondary | ICD-10-CM | POA: Diagnosis not present

## 2018-09-03 DIAGNOSIS — K219 Gastro-esophageal reflux disease without esophagitis: Secondary | ICD-10-CM | POA: Diagnosis not present

## 2018-09-03 DIAGNOSIS — I10 Essential (primary) hypertension: Secondary | ICD-10-CM | POA: Diagnosis not present

## 2018-09-03 DIAGNOSIS — D649 Anemia, unspecified: Secondary | ICD-10-CM | POA: Diagnosis not present

## 2018-09-03 DIAGNOSIS — D469 Myelodysplastic syndrome, unspecified: Secondary | ICD-10-CM | POA: Diagnosis not present

## 2018-09-03 DIAGNOSIS — I2510 Atherosclerotic heart disease of native coronary artery without angina pectoris: Secondary | ICD-10-CM | POA: Diagnosis not present

## 2018-09-04 DIAGNOSIS — I951 Orthostatic hypotension: Secondary | ICD-10-CM | POA: Diagnosis not present

## 2018-09-04 DIAGNOSIS — D469 Myelodysplastic syndrome, unspecified: Secondary | ICD-10-CM | POA: Diagnosis not present

## 2018-09-04 DIAGNOSIS — K219 Gastro-esophageal reflux disease without esophagitis: Secondary | ICD-10-CM | POA: Diagnosis not present

## 2018-09-04 DIAGNOSIS — I2510 Atherosclerotic heart disease of native coronary artery without angina pectoris: Secondary | ICD-10-CM | POA: Diagnosis not present

## 2018-09-04 DIAGNOSIS — I10 Essential (primary) hypertension: Secondary | ICD-10-CM | POA: Diagnosis not present

## 2018-09-04 DIAGNOSIS — D649 Anemia, unspecified: Secondary | ICD-10-CM | POA: Diagnosis not present

## 2018-09-08 DIAGNOSIS — D46C Myelodysplastic syndrome with isolated del(5q) chromosomal abnormality: Secondary | ICD-10-CM | POA: Diagnosis not present

## 2018-09-15 DIAGNOSIS — I2699 Other pulmonary embolism without acute cor pulmonale: Secondary | ICD-10-CM | POA: Diagnosis not present

## 2018-09-22 DIAGNOSIS — D464 Refractory anemia, unspecified: Secondary | ICD-10-CM | POA: Diagnosis not present

## 2018-09-22 DIAGNOSIS — D46C Myelodysplastic syndrome with isolated del(5q) chromosomal abnormality: Secondary | ICD-10-CM | POA: Diagnosis not present

## 2018-09-29 DIAGNOSIS — J449 Chronic obstructive pulmonary disease, unspecified: Secondary | ICD-10-CM | POA: Diagnosis not present

## 2018-09-29 DIAGNOSIS — K921 Melena: Secondary | ICD-10-CM | POA: Diagnosis not present

## 2018-10-05 DIAGNOSIS — K921 Melena: Secondary | ICD-10-CM | POA: Diagnosis not present

## 2018-10-05 DIAGNOSIS — J449 Chronic obstructive pulmonary disease, unspecified: Secondary | ICD-10-CM | POA: Diagnosis not present

## 2018-10-05 DIAGNOSIS — D46C Myelodysplastic syndrome with isolated del(5q) chromosomal abnormality: Secondary | ICD-10-CM | POA: Diagnosis not present

## 2018-10-19 DIAGNOSIS — J209 Acute bronchitis, unspecified: Secondary | ICD-10-CM | POA: Diagnosis not present

## 2018-10-19 DIAGNOSIS — D46C Myelodysplastic syndrome with isolated del(5q) chromosomal abnormality: Secondary | ICD-10-CM | POA: Diagnosis not present

## 2018-10-19 DIAGNOSIS — J449 Chronic obstructive pulmonary disease, unspecified: Secondary | ICD-10-CM | POA: Diagnosis not present

## 2018-10-29 DIAGNOSIS — D464 Refractory anemia, unspecified: Secondary | ICD-10-CM | POA: Diagnosis not present

## 2018-11-02 DIAGNOSIS — J209 Acute bronchitis, unspecified: Secondary | ICD-10-CM | POA: Diagnosis not present

## 2018-11-02 DIAGNOSIS — D46C Myelodysplastic syndrome with isolated del(5q) chromosomal abnormality: Secondary | ICD-10-CM | POA: Diagnosis not present

## 2018-11-02 DIAGNOSIS — R609 Edema, unspecified: Secondary | ICD-10-CM | POA: Diagnosis not present

## 2018-11-09 DIAGNOSIS — D46C Myelodysplastic syndrome with isolated del(5q) chromosomal abnormality: Secondary | ICD-10-CM | POA: Diagnosis not present

## 2018-11-09 DIAGNOSIS — R609 Edema, unspecified: Secondary | ICD-10-CM | POA: Diagnosis not present

## 2018-11-12 DIAGNOSIS — D464 Refractory anemia, unspecified: Secondary | ICD-10-CM | POA: Diagnosis not present

## 2018-11-16 DIAGNOSIS — D469 Myelodysplastic syndrome, unspecified: Secondary | ICD-10-CM | POA: Diagnosis not present

## 2018-11-16 DIAGNOSIS — J84112 Idiopathic pulmonary fibrosis: Secondary | ICD-10-CM | POA: Diagnosis not present

## 2018-11-16 DIAGNOSIS — D46C Myelodysplastic syndrome with isolated del(5q) chromosomal abnormality: Secondary | ICD-10-CM | POA: Diagnosis not present

## 2018-11-18 DIAGNOSIS — Z462 Encounter for fitting and adjustment of other devices related to nervous system and special senses: Secondary | ICD-10-CM | POA: Diagnosis not present

## 2018-11-18 DIAGNOSIS — I4891 Unspecified atrial fibrillation: Secondary | ICD-10-CM | POA: Diagnosis not present

## 2018-11-18 DIAGNOSIS — Z45018 Encounter for adjustment and management of other part of cardiac pacemaker: Secondary | ICD-10-CM | POA: Diagnosis not present

## 2018-11-23 DIAGNOSIS — D46C Myelodysplastic syndrome with isolated del(5q) chromosomal abnormality: Secondary | ICD-10-CM | POA: Diagnosis not present

## 2018-11-30 DIAGNOSIS — K625 Hemorrhage of anus and rectum: Secondary | ICD-10-CM | POA: Diagnosis not present

## 2018-12-08 DIAGNOSIS — J309 Allergic rhinitis, unspecified: Secondary | ICD-10-CM | POA: Diagnosis not present

## 2018-12-14 DIAGNOSIS — D464 Refractory anemia, unspecified: Secondary | ICD-10-CM | POA: Diagnosis not present

## 2018-12-14 DIAGNOSIS — D469 Myelodysplastic syndrome, unspecified: Secondary | ICD-10-CM | POA: Diagnosis not present

## 2018-12-14 DIAGNOSIS — D649 Anemia, unspecified: Secondary | ICD-10-CM | POA: Diagnosis not present

## 2018-12-14 DIAGNOSIS — J84112 Idiopathic pulmonary fibrosis: Secondary | ICD-10-CM | POA: Diagnosis not present

## 2018-12-14 DIAGNOSIS — E876 Hypokalemia: Secondary | ICD-10-CM | POA: Diagnosis not present

## 2018-12-14 DIAGNOSIS — D46C Myelodysplastic syndrome with isolated del(5q) chromosomal abnormality: Secondary | ICD-10-CM | POA: Diagnosis not present

## 2018-12-20 DIAGNOSIS — I469 Cardiac arrest, cause unspecified: Secondary | ICD-10-CM | POA: Diagnosis not present

## 2018-12-21 DIAGNOSIS — I469 Cardiac arrest, cause unspecified: Secondary | ICD-10-CM | POA: Diagnosis not present

## 2018-12-21 DIAGNOSIS — F0280 Dementia in other diseases classified elsewhere without behavioral disturbance: Secondary | ICD-10-CM | POA: Diagnosis not present

## 2018-12-21 DIAGNOSIS — J84112 Idiopathic pulmonary fibrosis: Secondary | ICD-10-CM | POA: Diagnosis not present

## 2018-12-21 DIAGNOSIS — G309 Alzheimer's disease, unspecified: Secondary | ICD-10-CM | POA: Diagnosis not present

## 2018-12-21 DIAGNOSIS — Z9049 Acquired absence of other specified parts of digestive tract: Secondary | ICD-10-CM | POA: Diagnosis not present

## 2018-12-21 DIAGNOSIS — J9600 Acute respiratory failure, unspecified whether with hypoxia or hypercapnia: Secondary | ICD-10-CM | POA: Diagnosis not present

## 2018-12-21 DIAGNOSIS — D759 Disease of blood and blood-forming organs, unspecified: Secondary | ICD-10-CM | POA: Diagnosis not present

## 2018-12-21 DIAGNOSIS — J449 Chronic obstructive pulmonary disease, unspecified: Secondary | ICD-10-CM | POA: Diagnosis not present

## 2018-12-21 DIAGNOSIS — D469 Myelodysplastic syndrome, unspecified: Secondary | ICD-10-CM | POA: Diagnosis not present

## 2018-12-21 DIAGNOSIS — Z950 Presence of cardiac pacemaker: Secondary | ICD-10-CM | POA: Diagnosis not present

## 2018-12-23 DIAGNOSIS — D464 Refractory anemia, unspecified: Secondary | ICD-10-CM | POA: Diagnosis not present

## 2019-01-03 DEATH — deceased
